# Patient Record
Sex: Female | Born: 1985 | Race: Black or African American | Hispanic: No | Marital: Married | Smoking: Current every day smoker
Health system: Southern US, Community
[De-identification: ages and names within clinical notes are randomized; demographics above are authoritative.]

## PROBLEM LIST (undated history)

## (undated) DIAGNOSIS — E559 Vitamin D deficiency, unspecified: Secondary | ICD-10-CM

## (undated) DIAGNOSIS — F1410 Cocaine abuse, uncomplicated: Secondary | ICD-10-CM

## (undated) DIAGNOSIS — N390 Urinary tract infection, site not specified: Secondary | ICD-10-CM

## (undated) DIAGNOSIS — I10 Essential (primary) hypertension: Secondary | ICD-10-CM

## (undated) DIAGNOSIS — J42 Unspecified chronic bronchitis: Secondary | ICD-10-CM

## (undated) HISTORY — PX: FRACTURE SURGERY: SHX138

## (undated) HISTORY — DX: Essential (primary) hypertension: I10

---
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
## 2006-06-22 ENCOUNTER — Emergency Department (HOSPITAL_COMMUNITY): Admission: EM | Admit: 2006-06-22 | Discharge: 2006-06-22 | Payer: Self-pay | Admitting: Emergency Medicine

## 2006-10-26 ENCOUNTER — Emergency Department (HOSPITAL_COMMUNITY): Admission: EM | Admit: 2006-10-26 | Discharge: 2006-10-26 | Payer: Self-pay | Admitting: Family Medicine

## 2006-11-29 ENCOUNTER — Emergency Department (HOSPITAL_COMMUNITY): Admission: EM | Admit: 2006-11-29 | Discharge: 2006-11-29 | Payer: Self-pay | Admitting: Emergency Medicine

## 2007-02-06 ENCOUNTER — Emergency Department (HOSPITAL_COMMUNITY): Admission: EM | Admit: 2007-02-06 | Discharge: 2007-02-06 | Payer: Self-pay | Admitting: Emergency Medicine

## 2007-04-29 ENCOUNTER — Emergency Department (HOSPITAL_COMMUNITY): Admission: EM | Admit: 2007-04-29 | Discharge: 2007-04-29 | Payer: Self-pay | Admitting: Emergency Medicine

## 2008-01-08 ENCOUNTER — Emergency Department (HOSPITAL_COMMUNITY): Admission: EM | Admit: 2008-01-08 | Discharge: 2008-01-08 | Payer: Self-pay | Admitting: Family Medicine

## 2009-01-22 ENCOUNTER — Emergency Department (HOSPITAL_COMMUNITY): Admission: EM | Admit: 2009-01-22 | Discharge: 2009-01-22 | Payer: Self-pay | Admitting: Family Medicine

## 2009-02-25 ENCOUNTER — Emergency Department (HOSPITAL_COMMUNITY): Admission: EM | Admit: 2009-02-25 | Discharge: 2009-02-25 | Payer: Self-pay | Admitting: Family Medicine

## 2009-06-12 ENCOUNTER — Emergency Department (HOSPITAL_COMMUNITY): Admission: EM | Admit: 2009-06-12 | Discharge: 2009-06-12 | Payer: Self-pay | Admitting: Family Medicine

## 2009-09-07 ENCOUNTER — Emergency Department (HOSPITAL_COMMUNITY): Admission: EM | Admit: 2009-09-07 | Discharge: 2009-09-07 | Payer: Self-pay | Admitting: Emergency Medicine

## 2010-03-19 ENCOUNTER — Emergency Department (HOSPITAL_COMMUNITY): Admission: EM | Admit: 2010-03-19 | Discharge: 2010-03-19 | Payer: Self-pay | Admitting: Emergency Medicine

## 2010-07-26 ENCOUNTER — Emergency Department (HOSPITAL_COMMUNITY)
Admission: EM | Admit: 2010-07-26 | Discharge: 2010-07-26 | Payer: Self-pay | Source: Home / Self Care | Admitting: Family Medicine

## 2010-07-31 LAB — POCT PREGNANCY, URINE: Preg Test, Ur: NEGATIVE

## 2010-10-18 LAB — POCT RAPID STREP A (OFFICE): Streptococcus, Group A Screen (Direct): POSITIVE — AB

## 2010-11-26 ENCOUNTER — Emergency Department (HOSPITAL_COMMUNITY)
Admission: EM | Admit: 2010-11-26 | Discharge: 2010-11-26 | Disposition: A | Discharge status: Home / Self Care | Payer: Self-pay | Attending: Emergency Medicine | Admitting: Emergency Medicine

## 2010-11-26 ENCOUNTER — Emergency Department (HOSPITAL_COMMUNITY): Payer: Self-pay

## 2010-11-26 DIAGNOSIS — R630 Anorexia: Secondary | ICD-10-CM | POA: Insufficient documentation

## 2010-11-26 DIAGNOSIS — A499 Bacterial infection, unspecified: Secondary | ICD-10-CM | POA: Insufficient documentation

## 2010-11-26 DIAGNOSIS — N12 Tubulo-interstitial nephritis, not specified as acute or chronic: Secondary | ICD-10-CM | POA: Insufficient documentation

## 2010-11-26 DIAGNOSIS — N760 Acute vaginitis: Secondary | ICD-10-CM | POA: Insufficient documentation

## 2010-11-26 DIAGNOSIS — R1031 Right lower quadrant pain: Secondary | ICD-10-CM | POA: Insufficient documentation

## 2010-11-26 DIAGNOSIS — B9689 Other specified bacterial agents as the cause of diseases classified elsewhere: Secondary | ICD-10-CM | POA: Insufficient documentation

## 2010-11-26 LAB — DIFFERENTIAL
Basophils Absolute: 0 10*3/uL (ref 0.0–0.1)
Basophils Relative: 0 % (ref 0–1)
Eosinophils Absolute: 0 10*3/uL (ref 0.0–0.7)
Eosinophils Relative: 0 % (ref 0–5)
Lymphocytes Relative: 6 % — ABNORMAL LOW (ref 12–46)
Lymphs Abs: 1 10*3/uL (ref 0.7–4.0)
Monocytes Absolute: 1.7 10*3/uL — ABNORMAL HIGH (ref 0.1–1.0)
Monocytes Relative: 11 % (ref 3–12)
Neutro Abs: 13.6 10*3/uL — ABNORMAL HIGH (ref 1.7–7.7)
Neutrophils Relative %: 83 % — ABNORMAL HIGH (ref 43–77)

## 2010-11-26 LAB — URINALYSIS, ROUTINE W REFLEX MICROSCOPIC
Bilirubin Urine: NEGATIVE
Glucose, UA: NEGATIVE mg/dL
Ketones, ur: 15 mg/dL — AB
Nitrite: POSITIVE — AB
Protein, ur: 30 mg/dL — AB
Specific Gravity, Urine: 1.01 (ref 1.005–1.030)
Urobilinogen, UA: 2 mg/dL — ABNORMAL HIGH (ref 0.0–1.0)
pH: 6 (ref 5.0–8.0)

## 2010-11-26 LAB — BASIC METABOLIC PANEL
BUN: 3 mg/dL — ABNORMAL LOW (ref 6–23)
CO2: 24 mEq/L (ref 19–32)
Calcium: 8.6 mg/dL (ref 8.4–10.5)
Chloride: 100 mEq/L (ref 96–112)
Creatinine, Ser: 0.65 mg/dL (ref 0.4–1.2)
GFR calc Af Amer: >60 mL/min (ref >60)
GFR calc non Af Amer: >60 mL/min (ref >60)
Glucose, Bld: 98 mg/dL (ref 70–99)
Potassium: 3.5 mEq/L (ref 3.5–5.1)
Sodium: 132 mEq/L — ABNORMAL LOW (ref 135–145)

## 2010-11-26 LAB — CBC
HCT: 41.4 % (ref 36.0–46.0)
Hemoglobin: 15 g/dL (ref 12.0–15.0)
MCH: 32.1 pg (ref 26.0–34.0)
MCHC: 36.2 g/dL — ABNORMAL HIGH (ref 30.0–36.0)
MCV: 88.5 fL (ref 78.0–100.0)
Platelets: 207 10*3/uL (ref 150–400)
RBC: 4.68 MIL/uL (ref 3.87–5.11)
RDW: 13.6 % (ref 11.5–15.5)
WBC: 16.3 10*3/uL — ABNORMAL HIGH (ref 4.0–10.5)

## 2010-11-26 LAB — URINE MICROSCOPIC-ADD ON

## 2010-11-26 LAB — POCT PREGNANCY, URINE: Preg Test, Ur: NEGATIVE

## 2010-11-26 LAB — WET PREP, GENITAL
Trich, Wet Prep: NONE SEEN
Yeast Wet Prep HPF POC: NONE SEEN

## 2010-11-26 LAB — RPR: RPR Ser Ql: NONREACTIVE

## 2010-11-26 MED ORDER — IOHEXOL 300 MG/ML  SOLN
100.0000 mL | Freq: Once | INTRAMUSCULAR | Status: AC | PRN
  Administered 2010-11-26: 100 mL via INTRAVENOUS

## 2010-11-27 LAB — GC/CHLAMYDIA PROBE AMP, GENITAL
Chlamydia, DNA Probe: NEGATIVE
GC Probe Amp, Genital: NEGATIVE

## 2010-11-29 LAB — URINE CULTURE
Colony Count: >=100000
Culture  Setup Time: 201205140001

## 2011-04-15 ENCOUNTER — Inpatient Hospital Stay (INDEPENDENT_AMBULATORY_CARE_PROVIDER_SITE_OTHER)
Admission: RE | Admit: 2011-04-15 | Discharge: 2011-04-15 | Disposition: A | Discharge status: Home / Self Care | Payer: Self-pay | Source: Ambulatory Visit | Attending: Emergency Medicine | Admitting: Emergency Medicine

## 2011-04-15 ENCOUNTER — Ambulatory Visit (INDEPENDENT_AMBULATORY_CARE_PROVIDER_SITE_OTHER): Payer: Self-pay

## 2011-04-15 DIAGNOSIS — J40 Bronchitis, not specified as acute or chronic: Secondary | ICD-10-CM

## 2011-04-15 DIAGNOSIS — R071 Chest pain on breathing: Secondary | ICD-10-CM

## 2011-04-26 LAB — POCT URINALYSIS DIP (DEVICE)
Glucose, UA: NEGATIVE
Ketones, ur: NEGATIVE
Nitrite: NEGATIVE
Operator id: 116391
Protein, ur: 30 — AB
Specific Gravity, Urine: 1.025
Urobilinogen, UA: 2 — ABNORMAL HIGH
pH: 6

## 2011-04-26 LAB — GC/CHLAMYDIA PROBE AMP, GENITAL
Chlamydia, DNA Probe: POSITIVE — AB
GC Probe Amp, Genital: NEGATIVE

## 2011-04-26 LAB — WET PREP, GENITAL
Trich, Wet Prep: NONE SEEN
Yeast Wet Prep HPF POC: NONE SEEN

## 2011-04-26 LAB — POCT PREGNANCY, URINE
Operator id: 116391
Preg Test, Ur: NEGATIVE

## 2011-04-30 LAB — POCT URINALYSIS DIP (DEVICE)
Bilirubin Urine: NEGATIVE
Nitrite: NEGATIVE
Specific Gravity, Urine: 1.015
Urobilinogen, UA: 0.2
pH: 6.5

## 2011-04-30 LAB — POCT PREGNANCY, URINE
Operator id: 239701
Preg Test, Ur: NEGATIVE

## 2011-08-21 ENCOUNTER — Observation Stay (HOSPITAL_COMMUNITY): Payer: Self-pay

## 2011-08-21 ENCOUNTER — Observation Stay (HOSPITAL_COMMUNITY)
Admission: EM | Admit: 2011-08-21 | Discharge: 2011-08-21 | Disposition: A | Discharge status: Home / Self Care | Payer: Self-pay | Attending: Emergency Medicine | Admitting: Emergency Medicine

## 2011-08-21 ENCOUNTER — Encounter (HOSPITAL_COMMUNITY): Payer: Self-pay | Admitting: *Deleted

## 2011-08-21 ENCOUNTER — Emergency Department (HOSPITAL_COMMUNITY): Payer: Self-pay

## 2011-08-21 DIAGNOSIS — N10 Acute tubulo-interstitial nephritis: Secondary | ICD-10-CM

## 2011-08-21 DIAGNOSIS — R1031 Right lower quadrant pain: Principal | ICD-10-CM | POA: Insufficient documentation

## 2011-08-21 DIAGNOSIS — R300 Dysuria: Secondary | ICD-10-CM | POA: Insufficient documentation

## 2011-08-21 DIAGNOSIS — R509 Fever, unspecified: Secondary | ICD-10-CM | POA: Insufficient documentation

## 2011-08-21 DIAGNOSIS — R630 Anorexia: Secondary | ICD-10-CM | POA: Insufficient documentation

## 2011-08-21 LAB — URINE MICROSCOPIC-ADD ON

## 2011-08-21 LAB — URINALYSIS, ROUTINE W REFLEX MICROSCOPIC
Bilirubin Urine: NEGATIVE
Glucose, UA: NEGATIVE mg/dL
Specific Gravity, Urine: 1.008 (ref 1.005–1.030)
Urobilinogen, UA: 4 mg/dL — ABNORMAL HIGH (ref 0.0–1.0)

## 2011-08-21 LAB — COMPREHENSIVE METABOLIC PANEL
ALT: 8 U/L (ref 0–35)
AST: 14 U/L (ref 0–37)
Albumin: 3.2 g/dL — ABNORMAL LOW (ref 3.5–5.2)
CO2: 22 mEq/L (ref 19–32)
Chloride: 95 mEq/L — ABNORMAL LOW (ref 96–112)
GFR calc non Af Amer: >90 mL/min (ref >90)
Potassium: 3.5 mEq/L (ref 3.5–5.1)
Sodium: 130 mEq/L — ABNORMAL LOW (ref 135–145)
Total Bilirubin: 0.4 mg/dL (ref 0.3–1.2)

## 2011-08-21 LAB — CBC
HCT: 41.8 % (ref 36.0–46.0)
MCHC: 36.6 g/dL — ABNORMAL HIGH (ref 30.0–36.0)
Platelets: 291 10*3/uL (ref 150–400)
RDW: 12.6 % (ref 11.5–15.5)
WBC: 19.4 10*3/uL — ABNORMAL HIGH (ref 4.0–10.5)

## 2011-08-21 LAB — DIFFERENTIAL
Basophils Absolute: 0 10*3/uL (ref 0.0–0.1)
Basophils Relative: 0 % (ref 0–1)
Lymphocytes Relative: 6 % — ABNORMAL LOW (ref 12–46)
Neutro Abs: 15.3 10*3/uL — ABNORMAL HIGH (ref 1.7–7.7)
Neutrophils Relative %: 79 % — ABNORMAL HIGH (ref 43–77)

## 2011-08-21 MED ORDER — CIPROFLOXACIN HCL 500 MG PO TABS: 500.0000 mg | ORAL_TABLET | Freq: Two times a day (BID) | ORAL | Status: AC

## 2011-08-21 MED ORDER — HYDROMORPHONE HCL PF 1 MG/ML IJ SOLN
1.0000 mg | Freq: Once | INTRAMUSCULAR | Status: AC
  Administered 2011-08-21: 1 mg via INTRAVENOUS
  Filled 2011-08-21: qty 1

## 2011-08-21 MED ORDER — SODIUM CHLORIDE 0.9 % IV BOLUS (SEPSIS)
1000.0000 mL | Freq: Once | INTRAVENOUS | Status: AC
  Administered 2011-08-21: 1000 mL via INTRAVENOUS

## 2011-08-21 MED ORDER — ONDANSETRON HCL 4 MG/2ML IJ SOLN
4.0000 mg | Freq: Once | INTRAMUSCULAR | Status: AC
  Administered 2011-08-21: 4 mg via INTRAVENOUS
  Filled 2011-08-21: qty 2

## 2011-08-21 MED ORDER — IBUPROFEN 200 MG PO TABS: 600.0000 mg | ORAL_TABLET | Freq: Four times a day (QID) | ORAL | Status: DC | PRN

## 2011-08-21 MED ORDER — ONDANSETRON HCL 4 MG/2ML IJ SOLN: 4.0000 mg | Freq: Four times a day (QID) | INTRAMUSCULAR | Status: DC | PRN

## 2011-08-21 MED ORDER — DEXTROSE 5 % IV SOLN: 1.0000 g | INTRAVENOUS | Status: DC

## 2011-08-21 MED ORDER — DEXTROSE 5 % IV SOLN
1.0000 g | Freq: Once | INTRAVENOUS | Status: AC
  Administered 2011-08-21: 1 g via INTRAVENOUS
  Filled 2011-08-21: qty 10

## 2011-08-21 MED ORDER — ACETAMINOPHEN 325 MG PO TABS
650.0000 mg | ORAL_TABLET | Freq: Once | ORAL | Status: DC
  Filled 2011-08-21: qty 2

## 2011-08-21 MED ORDER — ONDANSETRON HCL 4 MG PO TABS: 4.0000 mg | ORAL_TABLET | Freq: Four times a day (QID) | ORAL | Status: AC

## 2011-08-21 MED ORDER — HYDROCODONE-ACETAMINOPHEN 5-325 MG PO TABS: 2.0000 | ORAL_TABLET | ORAL | Status: AC | PRN

## 2011-08-21 MED ORDER — ACETAMINOPHEN 325 MG PO TABS
650.0000 mg | ORAL_TABLET | Freq: Four times a day (QID) | ORAL | Status: DC | PRN
  Administered 2011-08-21: 650 mg via ORAL

## 2011-08-21 MED ORDER — IOHEXOL 300 MG/ML  SOLN
80.0000 mL | Freq: Once | INTRAMUSCULAR | Status: AC | PRN
  Administered 2011-08-21: 80 mL via INTRAVENOUS

## 2011-08-21 NOTE — ED Provider Notes (Signed)
History     CSN: [id number]  Arrival date & time 08/21/11  1113   First MD Initiated Contact with Patient 08/21/11 1230      Chief Complaint  Patient presents with  . Abdominal Pain    right    (Consider location/radiation/quality/duration/timing/severity/associated sxs/prior treatment) HPI Comments: Patient with onset of dysuria and right lateral abdominal pain 7 days ago. Pain is gradually worsened since then. Patient reports having lack of appetite for the past 2 days and has not eaten. She has had a fever at home. She denies nausea, vomiting, or diarrhea. She denies vaginal bleeding or discharge. No history of abdominal surgeries. Patient has not taken any medications for pain.  Patient is a 26 y.o. female presenting with abdominal pain. The history is provided by the patient.  Abdominal Pain The primary symptoms of the illness include abdominal pain, fever and dysuria. The primary symptoms of the illness do not include shortness of breath, nausea, vomiting, diarrhea, vaginal discharge or vaginal bleeding. The current episode started more than 2 days ago. The onset of the illness was gradual. The problem has been gradually worsening.  Additional symptoms associated with the illness include anorexia. Symptoms associated with the illness do not include back pain.    Past Medical History  Diagnosis Date  . Urinary tract infection     History reviewed. No pertinent past surgical history.  No family history on file.  History  Substance Use Topics  . Smoking status: Current Everyday Smoker  . Smokeless tobacco: Not on file  . Alcohol Use: Yes     occ    OB History    Grav Para Term Preterm Abortions TAB SAB Ect Mult Living                  Review of Systems  Constitutional: Positive for fever.  HENT: Negative for sore throat and rhinorrhea.   Eyes: Negative for redness.  Respiratory: Negative for cough and shortness of breath.   Cardiovascular: Negative for chest  pain.  Gastrointestinal: Positive for abdominal pain and anorexia. Negative for nausea, vomiting, diarrhea and blood in stool.  Genitourinary: Positive for dysuria and flank pain. Negative for vaginal bleeding and vaginal discharge.  Musculoskeletal: Negative for myalgias and back pain.  Skin: Negative for rash.  Neurological: Negative for headaches.    Allergies  Review of patient's allergies indicates no known allergies.  Home Medications  No current outpatient prescriptions on file.  BP 110/65  Pulse 109  Temp(Src) 100.3 F (37.9 C) (Oral)  Resp 18  SpO2 98%  LMP 07/17/2011  Physical Exam  Nursing note and vitals reviewed. Constitutional: She is oriented to person, place, and time. She appears well-developed and well-nourished.       Uncomfortable appearing  HENT:  Head: Normocephalic and atraumatic.  Eyes: Conjunctivae are normal. Right eye exhibits no discharge. Left eye exhibits no discharge.  Neck: Normal range of motion. Neck supple.  Cardiovascular: Regular rhythm and normal heart sounds.  Tachycardia present.   Pulmonary/Chest: Effort normal and breath sounds normal.  Abdominal: Soft. There is tenderness in the right lower quadrant. There is no rigidity, no rebound, no guarding and no CVA tenderness.    Neurological: She is alert and oriented to person, place, and time.  Skin: Skin is warm and dry.  Psychiatric: She has a normal mood and affect.    ED Course  Procedures (including critical care time)  Labs Reviewed  URINALYSIS, ROUTINE W REFLEX MICROSCOPIC - Abnormal; Notable  for the following:    Color, Urine AMBER (*) BIOCHEMICALS MAY BE AFFECTED BY COLOR   APPearance TURBID (*)    Hgb urine dipstick LARGE (*)    Protein, ur 100 (*)    Urobilinogen, UA 4.0 (*)    Nitrite POSITIVE (*)    Leukocytes, UA LARGE (*)    All other components within normal limits  URINE MICROSCOPIC-ADD ON - Abnormal; Notable for the following:    Bacteria, UA FEW (*)    All  other components within normal limits  CBC - Abnormal; Notable for the following:    WBC 19.4 (*)    Hemoglobin 15.3 (*)    MCHC 36.6 (*)    All other components within normal limits  DIFFERENTIAL - Abnormal; Notable for the following:    Neutrophils Relative 79 (*)    Neutro Abs 15.3 (*)    Lymphocytes Relative 6 (*)    Monocytes Relative 15 (*)    Monocytes Absolute 2.9 (*)    All other components within normal limits  COMPREHENSIVE METABOLIC PANEL - Abnormal; Notable for the following:    Sodium 130 (*)    Chloride 95 (*)    Glucose, Bld 104 (*)    BUN 4 (*)    Albumin 3.2 (*)    All other components within normal limits  POCT PREGNANCY, URINE  URINE CULTURE   Dg Chest 2 View  08/21/2011  *RADIOLOGY REPORT*  Clinical Data: Cough and fever  CHEST - 2 VIEW  Comparison: 04/15/2011  Findings: Lungs are hyperaerated and clear with bronchitic changes. Normal heart size.  No mass or consolidation.  No pneumothorax or pleural effusion.  IMPRESSION: No active cardiopulmonary disease.  Hyperaeration and bronchitic changes likely reflect COPD.  Original Report Authenticated By: Donavan Burnet, M.D.   Ct Abdomen Pelvis W Contrast  08/21/2011  *RADIOLOGY REPORT*  Clinical Data: Right lower quadrant abdominal pain, history of UTI, evaluate for pyelonephritis versus appendicitis  CT ABDOMEN AND PELVIS WITH CONTRAST  Technique:  Multidetector CT imaging of the abdomen and pelvis was performed following the standard protocol during bolus administration of intravenous contrast.  Contrast: 80mL OMNIPAQUE IOHEXOL 300 MG/ML IV SOLN  Comparison: 11/26/2010  Findings: Lung bases are essentially clear.  Liver, spleen, pancreas, and adrenal glands are within normal limits.  Gallbladder is unremarkable.  No intrahepatic or extrahepatic ductal dilatation.  Multiple wedge-shaped areas of hypoperfusion involving the right kidney.  At least one of possible area of hypoperfusion involving the medial interpolar left  kidney (series 2/image 28).  Mild perinephric soft tissue stranding inferior to the right kidney (series 2/image 48).  No evidence of bowel obstruction.  Normal appendix.  No evidence of abdominal aortic aneurysm.  No suspicious abdominopelvic lymphadenopathy.  Small volume pelvic ascites, likely physiologic.  Uterus and bilateral ovaries are unremarkable.  Bladder is within normal limits.  Visualized osseous structures are within normal limits.  IMPRESSION: Findings compatible with pyelonephritis involving the right kidney and possibly the medial interpolar left kidney, as described above.  Normal appendix.  Original Report Authenticated By: Charline Bills, M.D.     1. Pyelonephritis, acute     1:14 PM Patient seen and examined. Work-up initiated. Medications ordered.   Vital signs reviewed and are as follows: Filed Vitals:   08/21/11 1134  BP: 110/65  Pulse: 109  Temp: 100.3 F (37.9 C)  Resp: 18   Tachycardia and temperature noted. Patient has obvious urinary tract infection on UA. Will give fluids, give pain  medicine, nausea medicine, check labs. Suspect pyelonephritis currently, low suspicion for appendicitis.  3:25 PM Patient was seen and examined by Dr. Radford Pax. CT ordered to r/o appendicitis. Moved to CDU. Hand-off to [employer], Dr. Fonnie Jarvis.    Plan: dispo per CT, can go home on cipro if tolerating PO's. Otherwise admit.     MDM  Pyelonephritis        Carolee Rota, [place] 08/21/11 2228

## 2011-08-21 NOTE — ED Notes (Signed)
Pt was medicated for pain as ordered. Will monitor, VSS

## 2011-08-21 NOTE — ED Provider Notes (Signed)
Patient report received from [facility] PA-C. [phone] female, in CDU under pyelonephritis protocol. Plan to continue to hydrate, pain medication, recheck labs, and continue antibiotic. Abdominal CT order to rule out appendicitis. Plan to discharge based on CT result. Patient will be discharged with Cipro if she can tolerate by mouth.  5:26 PM Right lower quadrant abdominal pain with a urine shows evidence of urinary tract infection. [place] lab significant for a sodium of 130, chloride of 95, CO2 19.4, neutrophils 79%. Urine shows evidence of urinary tract infection chest x-ray is unremarkable. Patient is currently awaiting for an abdominal CT scan to bile appendicitis. She states her pain has returned and request for pain medication. Patient will be given pain medication, IV fluid, IV antibiotics. Elevated temp, acetaminophen given.    6:25 PM Abdominal CT scan shows evidence of pyelonephritis but no evidence of appendicitis. Patient is currently in no acute distress. She is able to tolerate by mouth. She has received 1 g of Rocephin along with IV fluid. Patient will be discharged with antibiotic and antinausea medication. Strict followup instruction given. Patient was understanding and agreed with plan.  Fayrene Helper, PA-C 08/21/11 [phone]

## 2011-08-21 NOTE — ED Notes (Signed)
Pt drinking second cup of contrast without difficulty.

## 2011-08-21 NOTE — ED Provider Notes (Signed)
Medical screening examination/treatment/procedure(s) were performed by non-physician practitioner and as supervising physician I was immediately available for consultation/collaboration.   Loren Racer, MD 08/21/11 2352

## 2011-08-21 NOTE — ED Notes (Signed)
Pt is here with right lateral abd pain and took penicillin for it.  PT is doubled over. Pt is having burning with urination.

## 2011-08-21 NOTE — ED Notes (Signed)
Fayrene Helper [facility] was made aware of patient's temperature of 100.6. Pt was medicated as ordered with Tylenol. Will monitor

## 2011-08-21 NOTE — ED Notes (Signed)
Pt finished with contrast.

## 2011-08-22 NOTE — ED Provider Notes (Signed)
Medical screening examination/treatment/procedure(s) were performed by non-physician practitioner and as supervising physician I was immediately available for consultation/collaboration.    Nelia Shi, MD 08/22/11 2042

## 2011-08-23 LAB — URINE CULTURE

## 2011-08-24 NOTE — ED Notes (Signed)
+   urine Patient treated with cipro-sensitive to same-chart appended per protocol MD. 

## 2014-03-20 ENCOUNTER — Emergency Department (HOSPITAL_COMMUNITY)
Admission: EM | Admit: 2014-03-20 | Discharge: 2014-03-20 | Disposition: A | Discharge status: Home / Self Care | Payer: Self-pay | Attending: Emergency Medicine | Admitting: Emergency Medicine

## 2014-03-20 ENCOUNTER — Encounter (HOSPITAL_COMMUNITY): Payer: Self-pay | Admitting: Emergency Medicine

## 2014-03-20 DIAGNOSIS — F17200 Nicotine dependence, unspecified, uncomplicated: Secondary | ICD-10-CM | POA: Insufficient documentation

## 2014-03-20 DIAGNOSIS — K089 Disorder of teeth and supporting structures, unspecified: Secondary | ICD-10-CM | POA: Insufficient documentation

## 2014-03-20 DIAGNOSIS — Z87440 Personal history of urinary (tract) infections: Secondary | ICD-10-CM | POA: Insufficient documentation

## 2014-03-20 DIAGNOSIS — K047 Periapical abscess without sinus: Secondary | ICD-10-CM | POA: Insufficient documentation

## 2014-03-20 MED ORDER — PENICILLIN V POTASSIUM 500 MG PO TABS: 500.0000 mg | ORAL_TABLET | Freq: Three times a day (TID) | ORAL | Status: DC

## 2014-03-20 MED ORDER — HYDROCODONE-ACETAMINOPHEN 5-325 MG PO TABS: 1.0000 | ORAL_TABLET | ORAL | Status: DC | PRN

## 2014-03-20 NOTE — Discharge Instructions (Signed)
Dental Abscess °A dental abscess is a collection of infected fluid (pus) from a bacterial infection in the inner part of the tooth (pulp). It usually occurs at the end of the tooth's root.  °CAUSES  °· Severe tooth decay. °· Trauma to the tooth that allows bacteria to enter into the pulp, such as a broken or chipped tooth. °SYMPTOMS  °· Severe pain in and around the infected tooth. °· Swelling and redness around the abscessed tooth or in the mouth or face. °· Tenderness. °· Pus drainage. °· Bad breath. °· Bitter taste in the mouth. °· Difficulty swallowing. °· Difficulty opening the mouth. °· Nausea. °· Vomiting. °· Chills. °· Swollen neck glands. °DIAGNOSIS  °· A medical and dental history will be taken. °· An examination will be performed by tapping on the abscessed tooth. °· X-rays may be taken of the tooth to identify the abscess. °TREATMENT °The goal of treatment is to eliminate the infection. You may be prescribed antibiotic medicine to stop the infection from spreading. A root canal may be performed to save the tooth. If the tooth cannot be saved, it may be pulled (extracted) and the abscess may be drained.  °HOME CARE INSTRUCTIONS °· Only take over-the-counter or prescription medicines for pain, fever, or discomfort as directed by your caregiver. °· Rinse your mouth (gargle) often with salt water (¼ tsp salt in 8 oz [250 ml] of warm water) to relieve pain or swelling. °· Do not drive after taking pain medicine (narcotics). °· Do not apply heat to the outside of your face. °· Return to your dentist for further treatment as directed. °SEEK MEDICAL CARE IF: °· Your pain is not helped by medicine. °· Your pain is getting worse instead of better. °SEEK IMMEDIATE MEDICAL CARE IF: °· You have a fever or persistent symptoms for more than 2-3 days. °· You have a fever and your symptoms suddenly get worse. °· You have chills or a very bad headache. °· You have problems breathing or swallowing. °· You have trouble  opening your mouth. °· You have swelling in the neck or around the eye. °Document Released: 07/02/2005 Document Revised: 03/26/2012 Document Reviewed: 10/10/2010 °ExitCare® Patient Information ©2015 [employer]. This information is not intended to replace advice given to you by your health care provider. Make sure you discuss any questions you have with your health care provider. ° °Emergency Department Resource Guide °1) Find a Doctor and Pay Out of Pocket °Although you won't have to find out who is covered by your insurance plan, it is a good idea to ask around and get recommendations. You will then need to call the office and see if the doctor you have chosen will accept you as a new patient and what types of options they offer for patients who are self-pay. Some doctors offer discounts or will set up payment plans for their patients who do not have insurance, but you will need to ask so you aren't surprised when you get to your appointment. ° °2) Contact Your Local Health Department °Not all health departments have doctors that can see patients for sick visits, but many do, so it is worth a call to see if yours does. If you don't know where your local health department is, you can check in your phone book. The CDC also has a tool to help you locate your state's health department, and many state websites also have listings of all of their local health departments. ° °3) Find a Walk-in Clinic °  If your illness is not likely to be very severe or complicated, you may want to try a walk in clinic. These are popping up all over the country in pharmacies, drugstores, and shopping centers. They're usually staffed by nurse practitioners or physician assistants that have been trained to treat common illnesses and complaints. They're usually fairly quick and inexpensive. However, if you have serious medical issues or chronic medical problems, these are probably not your best option.  No Primary Care Doctor: - Call  Health Connect at  [phone] - they can help you locate a primary care doctor that  accepts your insurance, provides certain services, etc. - Physician Referral [phone]    Dental Care: Organization         Address  Phone  Notes  [facility] Department of [facility] [place] [phone] Accepts children up to age 57 who are enrolled in [place] or [place]; pregnant women with a Medicaid card; and children who have applied for Medicaid or [place] Health Choice, but were declined, whose parents can pay a reduced fee at time of service.  [facility] Department of [facility] [place]  [address], [place] [phone] Accepts children up to age 87 who are enrolled in [place] or [place]; pregnant women with a Medicaid card; and children who have applied for Medicaid or [place] Health Choice, but were declined, whose parents can pay a reduced fee at time of service.  [facility]  [place] [phone] Patients are seen by appointment only. Walk-ins are not accepted. [place] will see patients 18 years of age and older. Monday - Tuesday (8am-5pm) Most Wednesdays (8:30-5pm) $30 per visit, cash only  [facility]  [address], [facility] [phone] Patients are seen by appointment only. Walk-ins are not accepted. [place] will see patients 28 years of age and older. One Wednesday Evening (Monthly: Volunteer Based).  $30 per visit, cash only  [place]  [phone] for adults; Children under age 53, call Graduate Pediatric Dentistry at [phone]. Children aged 81-14, please call [phone] to request a pediatric application.  Dental services are provided in all areas of dental care including fillings, crowns and bridges, complete and partial dentures, implants, gum treatment,  root canals, and extractions. Preventive care is also provided. Treatment is provided to both adults and children. Patients are selected via a lottery and there is often a waiting list.   [facility] [address], [place]  [phone] [email or web address]   [facility] [address] [place], [place] [phone], Ext. [phone] Second and Fourth Thursday of each month, opens at 6:30 AM; Clinic ends at 9 AM.  Patients are seen on a first-come first-served basis, and a limited number are seen during each clinic.   [facility]  [address] [place], [place] [phone]   Eligibility Requirements You must have lived in [place], [place], or [place] counties for at least the last three months.   You cannot be eligible for state or federal sponsored [employer], including [employer], [place], or [employer].   You generally cannot be eligible for healthcare insurance through your employer.    How to apply: Eligibility screenings are held every Tuesday and Wednesday afternoon from 1:00 pm until 4:00 pm. You do not need  an appointment for the interview!  [facility] [address] [place], [place]   [place]  [place]  [place]  [phone]

## 2014-03-20 NOTE — ED Notes (Signed)
Pt states shes had a toothache since Monday. She took naproxen this am with some relief but is concerned because the pain has persisted all week

## 2014-03-20 NOTE — ED Provider Notes (Signed)
CSN: [id number]     Arrival date & time 03/20/14  1322 History  This chart was scribed for Charlann Lange, PA, working with Charlesetta Shanks, MD found by Starleen Arms, ED Scribe. This patient was seen in room TR06C/TR06C and the patient's care was started at 1:41 PM.    No chief complaint on file.  Patient is a 28 y.o. female presenting with tooth pain. The history is provided by the patient. No language interpreter was used.  Dental Pain Severity:  Moderate Onset quality:  Gradual Duration:  6 days Timing:  Constant Progression:  Worsening Chronicity:  New Relieved by:  Nothing Worsened by:  Nothing tried Ineffective treatments:  NSAIDs Associated symptoms: no difficulty swallowing and no fever      HPI Comments: Ellen Stone is a 28 y.o. female who presents to the Emergency Department complaining of a gradually worsening area of dental swelling onset 6 days ago.  Patient reports ibuprofen initially relieved the pain, but upon worsening, taking increased doses of naproxen and ibuprofen did not afford relief.  Patient denies fever, facial swelling, difficulty swallowing. NKA  Past Medical History  Diagnosis Date  . Urinary tract infection    No past surgical history on file. No family history on file. History  Substance Use Topics  . Smoking status: Current Every Day Smoker  . Smokeless tobacco: Not on file  . Alcohol Use: Yes     Comment: occ   OB History   Grav Para Term Preterm Abortions TAB SAB Ect Mult Living                 Review of Systems  Constitutional: Negative for fever.  HENT: Positive for dental problem. Negative for trouble swallowing.   Gastrointestinal: Negative for vomiting.  All other systems reviewed and are negative.     Allergies  Review of patient's allergies indicates no known allergies.  Home Medications   Prior to Admission medications   Not on File   There were no vitals taken for this visit. Physical Exam  Nursing note and vitals  reviewed. Constitutional: She is oriented to person, place, and time. She appears well-developed and well-nourished. No distress.  HENT:  Head: Normocephalic and atraumatic.  Generally good dentition with abscess at number 4.  No facial swelling.  Anterior cervical adenopathy.  Oropharynx benign.   Eyes: Conjunctivae and EOM are normal.  Neck: Neck supple. No tracheal deviation present.  Cardiovascular: Normal rate.   Pulmonary/Chest: Effort normal. No respiratory distress.  Musculoskeletal: Normal range of motion.  Neurological: She is alert and oriented to person, place, and time.  Skin: Skin is warm and dry.  Psychiatric: She has a normal mood and affect. Her behavior is normal.    ED Course  Procedures (including critical care time)  DIAGNOSTIC STUDIES: Oxygen Saturation is 99% on RA, normal by my interpretation.    COORDINATION OF CARE:  1:43 PM Will prescribe penicillin.  Advised patient of return precautions, as well as need to follow-up with her dentist.  Patient acknowledges and agrees with plan.    Labs Review Labs Reviewed - No data to display  Imaging Review No results found.   EKG Interpretation None      MDM   Final diagnoses:  None    1. Dental abscess  Uncomplicated dental abscess, treated here with abx and pain management   I personally performed the services described in this documentation, which was scribed in my presence. The recorded information has been reviewed  and is accurate.     Dewaine Oats, PA-C 03/21/14 [phone]

## 2014-03-21 NOTE — ED Provider Notes (Signed)
Medical screening examination/treatment/procedure(s) were performed by non-physician practitioner and as supervising physician I was immediately available for consultation/collaboration.   EKG Interpretation None       Babette Relic, MD 03/21/14 1232

## 2015-05-27 ENCOUNTER — Encounter (HOSPITAL_COMMUNITY): Payer: Self-pay | Admitting: Nurse Practitioner

## 2015-05-27 ENCOUNTER — Emergency Department (HOSPITAL_COMMUNITY)
Admission: EM | Admit: 2015-05-27 | Discharge: 2015-05-27 | Disposition: A | Discharge status: Home / Self Care | Payer: Self-pay | Attending: Emergency Medicine | Admitting: Emergency Medicine

## 2015-05-27 DIAGNOSIS — Z87440 Personal history of urinary (tract) infections: Secondary | ICD-10-CM | POA: Insufficient documentation

## 2015-05-27 DIAGNOSIS — Z720 Tobacco use: Secondary | ICD-10-CM | POA: Insufficient documentation

## 2015-05-27 DIAGNOSIS — K0889 Other specified disorders of teeth and supporting structures: Secondary | ICD-10-CM | POA: Insufficient documentation

## 2015-05-27 DIAGNOSIS — K0381 Cracked tooth: Secondary | ICD-10-CM | POA: Insufficient documentation

## 2015-05-27 DIAGNOSIS — Z792 Long term (current) use of antibiotics: Secondary | ICD-10-CM | POA: Insufficient documentation

## 2015-05-27 MED ORDER — NAPROXEN 500 MG PO TABS: 500.0000 mg | ORAL_TABLET | Freq: Two times a day (BID) | ORAL | Status: DC

## 2015-05-27 NOTE — Discharge Instructions (Signed)
Take the prescribed medication as directed. Follow-up with dentist, call on Monday to make appointment. Return to the ED for new or worsening symptoms.   Emergency Department Resource Guide 1) Find a Doctor and Pay Out of Pocket Although you won't have to find out who is covered by your insurance plan, it is a good idea to ask around and get recommendations. You will then need to call the office and see if the doctor you have chosen will accept you as a new patient and what types of options they offer for patients who are self-pay. Some doctors offer discounts or will set up payment plans for their patients who do not have insurance, but you will need to ask so you aren't surprised when you get to your appointment.  2) Contact Your Local Health Department Not all health departments have doctors that can see patients for sick visits, but many do, so it is worth a call to see if yours does. If you don't know where your local health department is, you can check in your phone book. The CDC also has a tool to help you locate your state's health department, and many state websites also have listings of all of their local health departments.  3) Find a [facility] If your illness is not likely to be very severe or complicated, you may want to try a walk in clinic. These are popping up all over the country in pharmacies, drugstores, and shopping centers. They're usually staffed by nurse practitioners or physician assistants that have been trained to treat common illnesses and complaints. They're usually fairly quick and inexpensive. However, if you have serious medical issues or chronic medical problems, these are probably not your best option.  No Primary Care Doctor: - Call Health Connect at  [phone] - they can help you locate a primary care doctor that  accepts your insurance, provides certain services, etc. - Physician Referral Service- [phone]  Chronic Pain Problems: Organization          Address  Phone   Notes  [facility]  [phone] Patients need to be referred by their primary care doctor.   Medication Assistance: Organization         Address  Phone   Notes  [facility] [place]., [place], [place] [address] [phone] --Must be a resident of [facility] -- Must have NO insurance coverage whatsoever (no Medicaid/ Medicare, etc.) -- The pt. MUST have a primary care doctor that directs their care regularly and follows them in the community   MedAssist  [phone]   [employer]  [phone]    Agencies that provide inexpensive medical care: Organization         Address  Phone   Notes  [place]  [phone]   [facility]    [phone]   [facility] [place], [place] [address] [phone]   [place] [address] [place] [phone]   Planned Parenthood    [phone]   [facility]    [phone]   [place] and [place] [address], [place] Phone:  [phone], Fax:  [phone] Hours of Operation:  9 am - 6 pm, M-F.  Also accepts Medicaid/Medicare and self-pay.  [facility] for [place] [address], [employer] Phone: (  [phone], Fax: [phone]. Hours of Operation:  8:30 am - 5:30 pm, M-F.  Also accepts Medicaid and self-pay.  [facility] [place] [address], [place] Phone: [phone]   [place], [place], [place] [phone], Ext. [phone] Mondays & Thursdays: 7-9 AM.  First 15 patients are seen on a first come, first serve basis.    [place] Providers:  Organization         Address  Phone   Notes  [facility] [address], [place] [phone] Also accepts self-pay patients.  [facility] [address], [place]  [phone]   [place], [address], [place] [phone]   [facility] [address] [place] [phone]   Lucianne Lei [address], [place]   [phone] Only accepts [place] Access [place] patients after they have their name applied to their card.   Self-Pay (no insurance) in [facility]:  Organization         Address  Phone   Notes  Sickle Cell Patients, [facility] [place] [phone]   [facility] [place] [phone]   [facility] [place]  [place], [place], [place] [phone]   [facility]/Dr. Osei-Bonsu  [address] [place] or [address], [place] [phone] Phone number for both [place] and [place] locations is the same.  Urgent Medical and [facility] [address], [place] [phone]   [facility] [address] [place] or [address] [phone]   [facility] [address] [place] [phone], phone; [phone], fax Sees patients 1st and 3rd Saturday of every month.  Must not qualify for public or private insurance (i.e. Medicaid, Medicare, [place] Health Choice, Veterans' Benefits)  Household income should be no more than 200% of the poverty level The clinic cannot treat you if you are pregnant or think you are pregnant  Sexually transmitted diseases are not treated at the clinic.    Dental Care: Organization         Address  Phone  Notes  [facility] Department of [facility] [place] [phone] Accepts children up to age 28 who are enrolled in [place] or [place]; pregnant women with a Medicaid card; and  children who have applied for Medicaid or [place] Health Choice, but were declined, whose parents can pay a reduced fee at time of service.  [facility] Department of [facility]  [address], [place] [phone] Accepts children up to age 15 who are enrolled in [place] or [place]; pregnant women with a Medicaid card; and children who have applied for Medicaid or [place] Health Choice, but were declined, whose parents can pay a reduced fee at time of service.  [place] [facility]  [place] [phone] Patients are seen by appointment only. Walk-ins are not accepted. [place] will see patients 11 years of age and older. Monday - Tuesday (8am-5pm) Most Wednesdays (8:30-5pm) $30 per visit, cash only  [facility]  [address],  [place] [phone] Patients are seen by appointment only. Walk-ins are not accepted. [place] will see patients 54 years of age and older. One Wednesday Evening (Monthly: Volunteer Based).  $30 per visit, cash only  [place]  [phone] for adults; Children under age 55, call Graduate Pediatric Dentistry at [phone]. Children aged 23-14, please call [phone] to request a pediatric application.  Dental services are provided in all areas of dental care including fillings, crowns and bridges, complete and partial dentures, implants, gum treatment, root canals, and extractions. Preventive care is also provided. Treatment is provided to both adults and children. Patients are selected via a lottery and there is often a waiting list.   [facility] [address] [place]  [phone] [email or web address]   [facility] [address] [place], [place] [phone], Ext. [phone] Second and Fourth Thursday of each month, opens at 6:30 AM; Clinic ends at 9 AM.  Patients are seen on a first-come first-served  basis, and a limited number are seen during each clinic.   [facility]  [address] [place], [place] [phone]   Eligibility Requirements You must have lived in [place], [place], or [place] counties for at least the last three months.   You cannot be eligible for state or federal sponsored [employer], including [employer], [place], or [employer].   You generally cannot be eligible for healthcare insurance through your employer.    How to apply: Eligibility screenings are held every Tuesday and Wednesday afternoon from 1:00 pm until 4:00 pm. You do not need an appointment for the interview!  [facility] [address] [place], [place]   [place]  [place] Department  [place]  [phone]    Behavioral Health Resources in the Community: Intensive Outpatient Programs Organization         Address  Phone  Notes  [place] Hunter. [address] [place], [place] [phone]   [facility] Outpatient [address], [place], [place]   ADS: [facility] [address], [place], [place]   [place] [address],  [place], [place] or [phone]   Substance Abuse Resources Organization         Address  Phone  Notes  Al[facility] Drug Services  [phone]   [place]  [phone]   [facility] [place]   Chinita Pester  [phone]   Residential & Outpatient Substance Abuse Program  [phone]   Psychological Services Organization         Address  Phone  Notes  [facility] [place]  [place]  [phone]   [place] [address], [place] [employer] or [phone]    Mobile Crisis Teams Organization          Address  Phone  Notes  [facility], Mobile Crisis Care Unit  [phone]   [facility]  [address] [place], [place]   [place] [address], [place] [phone]    Self-Help/Support Groups Organization         Address  Phone             Notes  [place]. of [place] - variety of support  groups  [phone]- [phone] Call for more information  Narcotics Anonymous (NA), [facility] [address], [employer] [place]  2 meetings at this location   Residential Facilities manager         Address  Phone  Notes  [facility] [place],    [place]  [phone]   [facility]  [address], [place] [phone], [place], [place]   [place], [place] [phone] Admissions: 8am-3pm M-F  Incentives Substance [place] [address]    [place], [place] [phone]   [facility] [address] [place], [place], [place]   [facility] [address]  [place], [place]   [facility] - Intensive Outpatient [address] Kristeen [address], [place], [place]   [facility] [address].,  [place], [place] [phone] or [phone]   [facility] ([facility]S) [address] [place], [place] Accepts Medicaid  [facility] [place] [address]  [place] [phone] Substance Abuse/Addiction Treatment   [facility] The Organization         Address  Phone  Notes  [facility]  [phone]   Domenic Schwab, PhD [address] Arlis [place], [place]   [phone] or [phone]   [place], [place] [phone]   [facility] [address] [place], [place] [phone] Insurance/Medicaid/sponsorship  through [facility] and Families [address]                                    [place], [place] [phone] [place] [address], [place] [phone]    Dr. Adele Schilder  [phone]   [facility] Dept. 1) [address] [place] 2) [place] 3)  [place] [address], [place] [phone]  [phone]   [place] [phone] or [phone] (After Hours)

## 2015-05-27 NOTE — ED Notes (Signed)
She c/o L lower toothache increasingly worse and constant over past 2 weeks. She does not have insurance to see a Pharmacist, community

## 2015-05-27 NOTE — ED Notes (Signed)
Patient states she had a filling in a tooth that fell out over 6 months ago.   Patient states that she has had pain since.

## 2015-05-27 NOTE — ED Provider Notes (Signed)
CSN: [id number]     Arrival date & time 05/27/15  1206 History  By signing my name below, I, Evelene Croon, attest that this documentation has been prepared under the direction and in the presence of non-physician practitioner, [place] Carnes, PA-C. Electronically Signed: Evelene Croon, Scribe. 05/27/2015. 1:27 PM.     Chief Complaint  Patient presents with  . Dental Pain    The history is provided by the patient. No language interpreter was used.     HPI Comments:  Ellen Stone is a 29 y.o. female who presents to the Emergency Department complaining of moderate constant left lower dental pain for ~ 2 weeks. She states the filling in that tooth came out ~ 2 months ago. She denies fever. No alleviating factors or associated symptoms noted. Pt does not currently have a dentist.  No intervention tried PTA.  VSS.  Past Medical History  Diagnosis Date  . Urinary tract infection    History reviewed. No pertinent past surgical history. History reviewed. No pertinent family history. Social History  Substance Use Topics  . Smoking status: Current Every Day Smoker  . Smokeless tobacco: None  . Alcohol Use: Yes     Comment: occ   OB History    No data available     Review of Systems  Constitutional: Negative for fever and chills.  HENT: Positive for dental problem.   Respiratory: Negative for shortness of breath.   Cardiovascular: Negative for chest pain.  All other systems reviewed and are negative.     Allergies  Review of patient's allergies indicates no known allergies.  Home Medications   Prior to Admission medications   Medication Sig Start Date End Date Taking? Authorizing Provider  HYDROcodone-acetaminophen (NORCO/VICODIN) 5-325 MG per tablet Take 1-2 tablets by mouth every 4 (four) hours as needed. 03/20/14   Charlann Lange, PA-C  naproxen (NAPROSYN) 500 MG tablet Take 1 tablet (500 mg total) by mouth 2 (two) times daily with a meal. 05/27/15   Larene Pickett, PA-C   penicillin v potassium (VEETID) 500 MG tablet Take 1 tablet (500 mg total) by mouth 3 (three) times daily. 03/20/14   Shari Upstill, PA-C   BP 97/62 mmHg  Pulse 82  Temp(Src) 98.3 F (36.8 C) (Oral)  Resp 17  Ht 5\' 11"  (1.803 m)  Wt 152 lb 14.4 oz (69.355 kg)  BMI 21.33 kg/m2  SpO2 100%  LMP 05/06/2015   Physical Exam  Constitutional: She is oriented to person, place, and time. She appears well-developed and well-nourished.  HENT:  Head: Normocephalic and atraumatic.  Mouth/Throat: Oropharynx is clear and moist.  Teeth largely in poor dentition, left lower molar broken with defect along posterior aspect where filling is missing, surrounding gingiva normal in appearance without swelling or discoloration, no dental abscess noted, handling secretions appropriately, no trismus; no facial or neck swelling   Eyes: Conjunctivae and EOM are normal. Pupils are equal, round, and reactive to light.  Neck: Normal range of motion.  Cardiovascular: Normal rate, regular rhythm and normal heart sounds.   Pulmonary/Chest: Effort normal and breath sounds normal. No respiratory distress. She has no wheezes.  Musculoskeletal: Normal range of motion.  Neurological: She is alert and oriented to person, place, and time.  Skin: Skin is warm and dry.  Psychiatric: She has a normal mood and affect.  Nursing note and vitals reviewed.   ED Course  Procedures   DIAGNOSTIC STUDIES:  Oxygen Saturation is 100% on RA, normal by my interpretation.  COORDINATION OF CARE:  1:24 PM Discussed treatment plan with pt at bedside and pt agreed to plan.    MDM   Final diagnoses:  Pain, dental   Dental pain without signs of dental abscess.  No facial or neck swelling to suggest Ludwig's angina. Patient will be referred back to dentist for further management. Rx Naprosyn.  Discussed plan with patient, he/she acknowledged understanding and agreed with plan of care.  Return precautions given for new or worsening  symptoms.  I personally performed the services described in this documentation, which was scribed in my presence. The recorded information has been reviewed and is accurate.   Larene Pickett, PA-C 05/27/15 [place], MD 05/27/15 [phone]

## 2015-06-02 ENCOUNTER — Encounter (HOSPITAL_COMMUNITY): Payer: Self-pay

## 2015-06-02 ENCOUNTER — Emergency Department (HOSPITAL_COMMUNITY)
Admission: EM | Admit: 2015-06-02 | Discharge: 2015-06-02 | Disposition: A | Discharge status: Home / Self Care | Payer: Self-pay | Attending: Emergency Medicine | Admitting: Emergency Medicine

## 2015-06-02 DIAGNOSIS — F1420 Cocaine dependence, uncomplicated: Secondary | ICD-10-CM | POA: Insufficient documentation

## 2015-06-02 DIAGNOSIS — Z87440 Personal history of urinary (tract) infections: Secondary | ICD-10-CM | POA: Insufficient documentation

## 2015-06-02 DIAGNOSIS — F1210 Cannabis abuse, uncomplicated: Secondary | ICD-10-CM | POA: Insufficient documentation

## 2015-06-02 DIAGNOSIS — Z792 Long term (current) use of antibiotics: Secondary | ICD-10-CM | POA: Insufficient documentation

## 2015-06-02 DIAGNOSIS — F17200 Nicotine dependence, unspecified, uncomplicated: Secondary | ICD-10-CM | POA: Insufficient documentation

## 2015-06-02 DIAGNOSIS — Z791 Long term (current) use of non-steroidal anti-inflammatories (NSAID): Secondary | ICD-10-CM | POA: Insufficient documentation

## 2015-06-02 HISTORY — DX: Cocaine abuse, uncomplicated: F14.10

## 2015-06-02 LAB — CBC
HEMATOCRIT: 40.7 % (ref 36.0–46.0)
Hemoglobin: 13.9 g/dL (ref 12.0–15.0)
MCH: 30.5 pg (ref 26.0–34.0)
MCHC: 34.2 g/dL (ref 30.0–36.0)
MCV: 89.5 fL (ref 78.0–100.0)
Platelets: 345 10*3/uL (ref 150–400)
RBC: 4.55 MIL/uL (ref 3.87–5.11)
RDW: 14 % (ref 11.5–15.5)
WBC: 13.8 10*3/uL — ABNORMAL HIGH (ref 4.0–10.5)

## 2015-06-02 LAB — COMPREHENSIVE METABOLIC PANEL
ALBUMIN: 3.1 g/dL — AB (ref 3.5–5.0)
ALT: 12 U/L — ABNORMAL LOW (ref 14–54)
ANION GAP: 5 (ref 5–15)
AST: 17 U/L (ref 15–41)
Alkaline Phosphatase: 34 U/L — ABNORMAL LOW (ref 38–126)
BUN: 6 mg/dL (ref 6–20)
CHLORIDE: 110 mmol/L (ref 101–111)
CO2: 23 mmol/L (ref 22–32)
CREATININE: 0.81 mg/dL (ref 0.44–1.00)
Calcium: 8.3 mg/dL — ABNORMAL LOW (ref 8.9–10.3)
GFR calc Af Amer: >60 mL/min (ref >60)
GFR calc non Af Amer: >60 mL/min (ref >60)
GLUCOSE: 103 mg/dL — AB (ref 65–99)
Potassium: 4.4 mmol/L (ref 3.5–5.1)
SODIUM: 138 mmol/L (ref 135–145)
Total Bilirubin: 0.2 mg/dL — ABNORMAL LOW (ref 0.3–1.2)
Total Protein: 5.1 g/dL — ABNORMAL LOW (ref 6.5–8.1)

## 2015-06-02 LAB — ETHANOL

## 2015-06-02 LAB — RAPID URINE DRUG SCREEN, HOSP PERFORMED
Amphetamines: NOT DETECTED
BARBITURATES: NOT DETECTED
Benzodiazepines: NOT DETECTED
COCAINE: POSITIVE — AB
Opiates: NOT DETECTED
TETRAHYDROCANNABINOL: POSITIVE — AB

## 2015-06-02 NOTE — ED Notes (Signed)
Pt stable, ambulatory, states understanding of discharge instructions 

## 2015-06-02 NOTE — ED Notes (Signed)
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 Last used Tuesday "about a gram". Pt admits to also using marijuana. Denies ETOH or any other illlegal drugs. Denies SI/HI, N/V/D/abdominal pain.

## 2015-06-02 NOTE — Discharge Instructions (Signed)
Substance Abuse Treatment Programs ° °Intensive Outpatient Programs °[facility]     °[address]      °[place], [place]                   °[phone]      ° °[facility] °[address] °[place], [place] °[phone] ° °[facility]     °(Inpatient and outpatient)     °[address]           °[phone]   ° °[facility] °336-288-1484 (Suboxone and Methadone) ° °[address]      °[place], [place] [address]      °336-882-2125      ° °[address] °[place], [place] °[phone] ° °[facility] (Outpatient/Inpatient, Chemical)    °(insurance only) [phone]      °       °[facility] (Groups & Residential) °[place], [place] °336-389-1413 ° °   °[facility]     °[address]     °[place], [place]      °[phone]      ° °[facility] (for caregivers and family) °[address] °[place], [place] °336-299-4655 ° ° ° ° ° °Residential Treatment Programs °[facility]      °[address], [place], [place] [address]  °([phone]) [phone]      ° °[facility] °[address] [place], [place] [address] °[phone] ° °[facility]        °336-248-8914      ° °[facility] °[phone] ° °[facility]             °[address]                                         °[place], [place]                                                °[phone] or [phone]                              ° °[facility] [place] °[address] °[place], [address] °[phone] ° °[facility]    °[address]      °[place], [place]     °[phone]      ° °[facility] °[address] °[place], [place] °336-285-9073 ° °[facility]   °[address]     °[place], [place] [address]     °336-899-1550      °Admissions: 8am-3pm M-F ° °[facility] ([facility]) °[address] °[place],  [place] °336-227-7417 ° °[facility]: Residential Program (90 Days)   °[place], [place]      °336-725-8389 or [phone]    ° °[facility] °[place], [place] °(Walk in Hours over the weekend or by referral) ° °[facility] [facility] °[address], [place],  [address] °(336) [phone] ° °Crisis Mobile: [facility]:  1-877-626-1772 (for crisis response 24 hours a day) °[facility] Hotline:      1-800-256-2452 °Outpatient Psychiatry and Counseling ° °[facility]: Mobile Crisis   Management 24 hours:  1-877-626-1772 ° °[facility] sliding scale fee and walk in schedule: M-F 8am-12pm/1pm-3pm °[address]  °[place], [place] [address] °[phone] ° °[facility] °[address] °[place], [place] [address] °336-703-9650 ° °[facility] ([facility] new patient walk-in appointments available Monday - Friday 8am -3pm.          °[address] °[place], [place] [address] °336-676-6840 or crisis line- 336-676-6905 ° °[facility]/ Intensive Outpatient Therapy Program °[address] °[place], [place] [address] °336-832-9804 ° °[facility]                  °Crisis Services      °336.641.4993      °[address]     °[place], [place] [address]                ° °[facility]   °[facility] °[phone] °[address] °[place], [place] [address] ° ° °[facility]          °[address],  °[place], [place] [address]       °(336) [phone] ° °[facility] °[address] °[place], [place] [address] °336-292-1510 ° °[facility]    °[address]    °[place], [place] [address]     °336-632-3505      ° °Parish McKinney, MD     °[address]     °[place] [address]     °336-282-1251     °  °[facility] °[address] °[place] [address] ° °[facility]     °[address]     °[place], [place]      °336-542-2076      ° °[facility] °Shamsher Ahluwalia, MD °[address] °[place], [place] [address] °336-420-9558 ° °[facility]     °[address]     °[place], [place] [address]     °336-274-1237      ° °[facility] °[address] °[place], [place] [address] °336-854-4450 °Resources for Temporary Residential Assistance/Crisis Centers ° °DAY CENTERS °[facility] °M-F 8am-3pm   °[address] [place], [place] [address]   336-332-0824 °Services include: laundry, barbering, support groups, case management, phone  & computer access, showers, AA/NA mtgs, mental health/substance abuse nurse, job skills class, disability information, VA assistance, spiritual classes, etc.  ° °HOMELESS SHELTERS ° °[facility]     °[facility]   °[address], [place]     °336.271.5959       °       °[facility] ([facility] and children)       °[address] °[place], [place] [address] °336-275-0820 °[email or web address] for application and process °Application Required ° °[facility]   °[address]    °[place] [address]     °336.886.4922       °             °[facility] °[address] °[place], [place] [address] °336.273.5572 °336-235-0363(schedule application appt.) °Application Required ° °[facility] (women only)    °[address]     °[place], [place] [address]     °336-884-1039      °  Intake starts 6pm daily °Need valid ID, SSC, & Police report °[facility] [place] °[address] °[place], [place] °336-881-5420 °Application Required ° °[facility] (men only)     °[address]      °[place], [place]     °336.748.1962      ° °[facility] °(Pregnant women only) °[address] °[place], [place] °336-275-0206 ° °[facility]      °[address]      °[place], [place] [address]     °336-722-9951      °       °[facility] °[address] °[place], [place] °336-723-1848 °90 day commitment/SA/Application process ° °[facility](men only)     °[address]     °[place], [place]     °336-748-1962       °Check-in at 7pm     °       °[facility] °[address] °[place], [place] [address] °336-248-6684 °Men/Women/Women and Children must be there by 7 pm ° °[facility] °[place],  °336-722-8721                ° °

## 2015-06-02 NOTE — ED Provider Notes (Signed)
CSN: [id number]     Arrival date & time 06/02/15  1932 History   First MD Initiated Contact with Patient 06/02/15 2214     Chief Complaint  Patient presents with  . Drug Problem     (Consider location/radiation/quality/duration/timing/severity/associated sxs/prior Treatment) HPI Comments: Patient here requesting detox from cocaine. Last use was 2 days ago. Denies any current psychiatric complaints of. No suicidal or homicidal ideations. Denies any chest pain or shortness of breath. No auditory of visual hallucinations. Denies any concurrent use of alcohol. Called in outpatient rehabilitation facility and was told to come here for medical clearance  Patient is a 29 y.o. female presenting with drug problem. The history is provided by the patient and a parent.  Drug Problem    Past Medical History  Diagnosis Date  . Urinary tract infection   . Cocaine abuse    History reviewed. No pertinent past surgical history. No family history on file. Social History  Substance Use Topics  . Smoking status: Current Every Day Smoker  . Smokeless tobacco: None  . Alcohol Use: Yes     Comment: occ   OB History    No data available     Review of Systems  All other systems reviewed and are negative.     Allergies  Review of patient's allergies indicates no known allergies.  Home Medications   Prior to Admission medications   Medication Sig Start Date End Date Taking? Authorizing Provider  HYDROcodone-acetaminophen (NORCO/VICODIN) 5-325 MG per tablet Take 1-2 tablets by mouth every 4 (four) hours as needed. 03/20/14   Charlann Lange, PA-C  naproxen (NAPROSYN) 500 MG tablet Take 1 tablet (500 mg total) by mouth 2 (two) times daily with a meal. 05/27/15   Larene Pickett, PA-C  penicillin v potassium (VEETID) 500 MG tablet Take 1 tablet (500 mg total) by mouth 3 (three) times daily. 03/20/14   Shari Upstill, PA-C   BP 93/70 mmHg  Pulse 89  Temp(Src) 98.6 F (37 C) (Oral)  Resp 18  SpO2  99%  LMP 05/06/2015 Physical Exam  Constitutional: She is oriented to person, place, and time. She appears well-developed and well-nourished.  Non-toxic appearance. No distress.  HENT:  Head: Normocephalic and atraumatic.  Eyes: Conjunctivae, EOM and lids are normal. Pupils are equal, round, and reactive to light.  Neck: Normal range of motion. Neck supple. No tracheal deviation present. No thyroid mass present.  Cardiovascular: Normal rate, regular rhythm and normal heart sounds.  Exam reveals no gallop.   No murmur heard. Pulmonary/Chest: Effort normal and breath sounds normal. No stridor. No respiratory distress. She has no decreased breath sounds. She has no wheezes. She has no rhonchi. She has no rales.  Abdominal: Soft. Normal appearance and bowel sounds are normal. She exhibits no distension. There is no tenderness. There is no rebound and no CVA tenderness.  Musculoskeletal: Normal range of motion. She exhibits no edema or tenderness.  Neurological: She is alert and oriented to person, place, and time. She has normal strength. No cranial nerve deficit or sensory deficit. GCS eye subscore is 4. GCS verbal subscore is 5. GCS motor subscore is 6.  Skin: Skin is warm and dry. No abrasion and no rash noted.  Psychiatric: She has a normal mood and affect. Her speech is normal and behavior is normal. She expresses no suicidal plans and no homicidal plans.  Nursing note and vitals reviewed.   ED Course  Procedures (including critical care time) Labs Review Labs Reviewed  COMPREHENSIVE METABOLIC PANEL - Abnormal; Notable for the following:    Glucose, Bld 103 (*)    Calcium 8.3 (*)    Total Protein 5.1 (*)    Albumin 3.1 (*)    ALT 12 (*)    Alkaline Phosphatase 34 (*)    Total Bilirubin 0.2 (*)    All other components within normal limits  CBC - Abnormal; Notable for the following:    WBC 13.8 (*)    All other components within normal limits  URINE RAPID DRUG SCREEN, HOSP PERFORMED  - Abnormal; Notable for the following:    Cocaine POSITIVE (*)    Tetrahydrocannabinol POSITIVE (*)    All other components within normal limits  ETHANOL    Imaging Review No results found. I have personally reviewed and evaluated these images and lab results as part of my medical decision-making.   EKG Interpretation None      MDM   Final diagnoses:  Cocaine dependence without complication [facility])   Patient has no evidence of acute psychiatric illness at this time. She is stable for discharge with outpatient resources Take the copy of your labs with you as proof of medical clearance when you seek rehabilitation services    Lacretia Leigh, MD 06/02/15 2236

## 2015-09-30 ENCOUNTER — Emergency Department (HOSPITAL_COMMUNITY): Payer: Self-pay

## 2015-09-30 ENCOUNTER — Encounter (HOSPITAL_COMMUNITY): Payer: Self-pay | Admitting: Nurse Practitioner

## 2015-09-30 ENCOUNTER — Emergency Department (HOSPITAL_COMMUNITY)
Admission: EM | Admit: 2015-09-30 | Discharge: 2015-09-30 | Disposition: A | Discharge status: Home / Self Care | Payer: Self-pay | Attending: Emergency Medicine | Admitting: Emergency Medicine

## 2015-09-30 DIAGNOSIS — F17200 Nicotine dependence, unspecified, uncomplicated: Secondary | ICD-10-CM | POA: Insufficient documentation

## 2015-09-30 DIAGNOSIS — Y9289 Other specified places as the place of occurrence of the external cause: Secondary | ICD-10-CM | POA: Insufficient documentation

## 2015-09-30 DIAGNOSIS — S82192A Other fracture of upper end of left tibia, initial encounter for closed fracture: Secondary | ICD-10-CM | POA: Insufficient documentation

## 2015-09-30 DIAGNOSIS — Y9389 Activity, other specified: Secondary | ICD-10-CM | POA: Insufficient documentation

## 2015-09-30 DIAGNOSIS — Z87440 Personal history of urinary (tract) infections: Secondary | ICD-10-CM | POA: Insufficient documentation

## 2015-09-30 DIAGNOSIS — S82202A Unspecified fracture of shaft of left tibia, initial encounter for closed fracture: Secondary | ICD-10-CM

## 2015-09-30 DIAGNOSIS — Y998 Other external cause status: Secondary | ICD-10-CM | POA: Insufficient documentation

## 2015-09-30 MED ORDER — OXYCODONE-ACETAMINOPHEN 5-325 MG PO TABS: 1.0000 | ORAL_TABLET | ORAL | Status: DC | PRN

## 2015-09-30 MED ORDER — ONDANSETRON HCL 4 MG/2ML IJ SOLN
4.0000 mg | Freq: Once | INTRAMUSCULAR | Status: AC
  Administered 2015-09-30: 4 mg via INTRAVENOUS
  Filled 2015-09-30: qty 2

## 2015-09-30 MED ORDER — HYDROMORPHONE HCL 1 MG/ML IJ SOLN
1.0000 mg | Freq: Once | INTRAMUSCULAR | Status: AC
  Administered 2015-09-30: 1 mg via INTRAVENOUS
  Filled 2015-09-30: qty 1

## 2015-09-30 MED ORDER — OXYCODONE-ACETAMINOPHEN 5-325 MG PO TABS
1.0000 | ORAL_TABLET | Freq: Once | ORAL | Status: AC
  Administered 2015-09-30: 1 via ORAL
  Filled 2015-09-30: qty 1

## 2015-09-30 MED ORDER — NAPROXEN 500 MG PO TABS: 500.0000 mg | ORAL_TABLET | Freq: Two times a day (BID) | ORAL | Status: DC
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
## 2015-09-30 NOTE — ED Notes (Signed)
Bed: WA20 Expected date:  Expected time:  Means of arrival:  Comments: 

## 2015-09-30 NOTE — ED Notes (Signed)
PT did not want to go out in chair stated she was just ready to go home
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
## 2015-09-30 NOTE — ED Notes (Signed)
Patient transported to CT 

## 2015-09-30 NOTE — ED Notes (Signed)
While taking PT back to room PT info nurse and I her crutches was not level right which made her unbalance

## 2015-09-30 NOTE — Discharge Instructions (Signed)
Go to Dr. Damita Dunnings office tomorrow at 9 AM. In the meantime take pain medication as needed. Keep your leg elevated. You may put up to 20 pounds of weight on that leg if you want. Return to the ER for new or worsening symptoms.

## 2015-09-30 NOTE — ED Notes (Signed)
Pt info staff she need to use restroom. Came back to room with chair for PT, PT did take self to restroom. Once bathroom door open I smelled smoke. Asked PT have she been smoking PT stated no and just wanted to go back in room to lay down

## 2015-10-13 ENCOUNTER — Encounter (HOSPITAL_COMMUNITY): Payer: Self-pay

## 2015-10-13 ENCOUNTER — Encounter (HOSPITAL_COMMUNITY)
Admission: RE | Admit: 2015-10-13 | Discharge: 2015-10-13 | Disposition: A | Discharge status: Home / Self Care | Payer: Self-pay | Source: Ambulatory Visit | Attending: Orthopedic Surgery | Admitting: Orthopedic Surgery

## 2015-10-13 DIAGNOSIS — X58XXXA Exposure to other specified factors, initial encounter: Secondary | ICD-10-CM | POA: Insufficient documentation

## 2015-10-13 DIAGNOSIS — S82142A Displaced bicondylar fracture of left tibia, initial encounter for closed fracture: Secondary | ICD-10-CM | POA: Insufficient documentation

## 2015-10-13 DIAGNOSIS — Z01812 Encounter for preprocedural laboratory examination: Secondary | ICD-10-CM | POA: Insufficient documentation

## 2015-10-13 LAB — CBC
HCT: 37.3 % (ref 36.0–46.0)
Hemoglobin: 12.2 g/dL (ref 12.0–15.0)
MCH: 29 pg (ref 26.0–34.0)
MCHC: 32.7 g/dL (ref 30.0–36.0)
MCV: 88.8 fL (ref 78.0–100.0)
PLATELETS: 442 10*3/uL — AB (ref 150–400)
RBC: 4.2 MIL/uL (ref 3.87–5.11)
RDW: 13.8 % (ref 11.5–15.5)
WBC: 8.7 10*3/uL (ref 4.0–10.5)

## 2015-10-13 LAB — COMPREHENSIVE METABOLIC PANEL
ALK PHOS: 54 U/L (ref 38–126)
ALT: 14 U/L (ref 14–54)
AST: 19 U/L (ref 15–41)
Albumin: 3.3 g/dL — ABNORMAL LOW (ref 3.5–5.0)
Anion gap: 7 (ref 5–15)
BUN: 14 mg/dL (ref 6–20)
CALCIUM: 8.7 mg/dL — AB (ref 8.9–10.3)
CO2: 24 mmol/L (ref 22–32)
CREATININE: 0.98 mg/dL (ref 0.44–1.00)
Chloride: 108 mmol/L (ref 101–111)
Glucose, Bld: 85 mg/dL (ref 65–99)
Potassium: 4 mmol/L (ref 3.5–5.1)
SODIUM: 139 mmol/L (ref 135–145)
Total Bilirubin: 0.3 mg/dL (ref 0.3–1.2)
Total Protein: 6.1 g/dL — ABNORMAL LOW (ref 6.5–8.1)

## 2015-10-13 LAB — URINALYSIS, ROUTINE W REFLEX MICROSCOPIC
Bilirubin Urine: NEGATIVE
Glucose, UA: NEGATIVE mg/dL
KETONES UR: NEGATIVE mg/dL
LEUKOCYTES UA: NEGATIVE
NITRITE: NEGATIVE
PROTEIN: NEGATIVE mg/dL
Specific Gravity, Urine: 1.026 (ref 1.005–1.030)
pH: 6 (ref 5.0–8.0)

## 2015-10-13 LAB — URINE MICROSCOPIC-ADD ON

## 2015-10-13 LAB — HCG, SERUM, QUALITATIVE: PREG SERUM: NEGATIVE

## 2015-10-13 NOTE — Pre-Procedure Instructions (Signed)
    Ellen Stone  10/13/2015      [id number], [place] [address] [place] [address] Phone: [phone] Fax: [phone]    Your procedure is scheduled on Tuesday April 4th  Report to [facility] Admitting at 6:00 am  Call this number if you have problems the morning of surgery:  [phone]  If questions prior to surgery date call [phone] between 8 am-4:30 pm   Remember:  Do not eat food or drink liquids after midnight.  Take these medicines the morning of surgery with A SIP OF WATER:pain pill if needed Stop taking all aspirin or aspirin containing products, anti inflammatories such as Ibuprofen, Naproxen, Advil, Motrin, Aleve, vitamins and herbal supplements   Do not wear jewelry, make-up or nail polish.  Do not wear lotions, powders, or perfumes.  You may wear deodorant.  Do not shave 48 hours prior to surgery.     Do not bring valuables to the hospital.  [facility] is not responsible for any belongings or valuables.  Contacts, dentures or bridgework may not be worn into surgery.  Leave your suitcase in the car.  After surgery it may be brought to your room.  Patients discharged the day of surgery will not be allowed to drive home.   Special instructions: Shower with the [facility] as instructed the night before and morning of surgery  Please read over the following fact sheets that you were given. Pain Booklet, Coughing and Deep Breathing and Surgical Site Infection Prevention

## 2015-10-13 NOTE — Progress Notes (Signed)
Pt denies any cardiopulmonary history.  Does not have a primary care physician.   Smokes 1-2 ppd of cigarettes.  Also states has used both cocaine and marijuana this week.  Instructed to stop taking these drugs for safe surgery.

## 2015-10-17 MED ORDER — SODIUM CHLORIDE 0.9 % IV SOLN: INTRAVENOUS | Status: DC

## 2015-10-17 MED ORDER — LACTATED RINGERS IV SOLN: INTRAVENOUS | Status: DC

## 2015-10-17 MED ORDER — CEFAZOLIN SODIUM-DEXTROSE 2-4 GM/100ML-% IV SOLN
2.0000 g | INTRAVENOUS | Status: AC
  Administered 2015-10-18: 2 g via INTRAVENOUS
  Filled 2015-10-17: qty 100

## 2015-10-18 ENCOUNTER — Inpatient Hospital Stay (HOSPITAL_COMMUNITY): Payer: Self-pay

## 2015-10-18 ENCOUNTER — Encounter (HOSPITAL_COMMUNITY)
Admission: RE | Disposition: A | Discharge status: Home / Self Care | Payer: Self-pay | Source: Ambulatory Visit | Attending: Orthopedic Surgery

## 2015-10-18 ENCOUNTER — Encounter (HOSPITAL_COMMUNITY): Payer: Self-pay | Admitting: Certified Registered Nurse Anesthetist

## 2015-10-18 ENCOUNTER — Inpatient Hospital Stay (HOSPITAL_COMMUNITY)
Admission: RE | Admit: 2015-10-18 | Discharge: 2015-10-20 | DRG: 493 | Disposition: A | Discharge status: Home / Self Care | Payer: Self-pay | Source: Ambulatory Visit | Attending: Orthopedic Surgery | Admitting: Orthopedic Surgery

## 2015-10-18 ENCOUNTER — Inpatient Hospital Stay (HOSPITAL_COMMUNITY): Payer: Self-pay | Admitting: Vascular Surgery

## 2015-10-18 ENCOUNTER — Inpatient Hospital Stay (HOSPITAL_COMMUNITY): Payer: Self-pay | Admitting: Certified Registered Nurse Anesthetist

## 2015-10-18 DIAGNOSIS — E559 Vitamin D deficiency, unspecified: Secondary | ICD-10-CM | POA: Diagnosis present

## 2015-10-18 DIAGNOSIS — R52 Pain, unspecified: Secondary | ICD-10-CM

## 2015-10-18 DIAGNOSIS — D62 Acute posthemorrhagic anemia: Secondary | ICD-10-CM | POA: Diagnosis not present

## 2015-10-18 DIAGNOSIS — Z791 Long term (current) use of non-steroidal anti-inflammatories (NSAID): Secondary | ICD-10-CM

## 2015-10-18 DIAGNOSIS — F1410 Cocaine abuse, uncomplicated: Secondary | ICD-10-CM | POA: Diagnosis present

## 2015-10-18 DIAGNOSIS — Z419 Encounter for procedure for purposes other than remedying health state, unspecified: Secondary | ICD-10-CM

## 2015-10-18 DIAGNOSIS — F1910 Other psychoactive substance abuse, uncomplicated: Secondary | ICD-10-CM | POA: Diagnosis present

## 2015-10-18 DIAGNOSIS — M899 Disorder of bone, unspecified: Secondary | ICD-10-CM | POA: Diagnosis present

## 2015-10-18 DIAGNOSIS — S82142A Displaced bicondylar fracture of left tibia, initial encounter for closed fracture: Principal | ICD-10-CM | POA: Diagnosis present

## 2015-10-18 DIAGNOSIS — S82143A Displaced bicondylar fracture of unspecified tibia, initial encounter for closed fracture: Secondary | ICD-10-CM | POA: Diagnosis present

## 2015-10-18 DIAGNOSIS — F17200 Nicotine dependence, unspecified, uncomplicated: Secondary | ICD-10-CM | POA: Diagnosis present

## 2015-10-18 HISTORY — DX: Urinary tract infection, site not specified: N39.0

## 2015-10-18 HISTORY — DX: Other disorders of phosphorus metabolism: E83.39

## 2015-10-18 HISTORY — PX: ORIF TIBIA PLATEAU: SHX2132

## 2015-10-18 HISTORY — DX: Vitamin D deficiency, unspecified: E55.9

## 2015-10-18 HISTORY — PX: ORIF PROXIMAL TIBIAL PLATEAU FRACTURE: SUR953

## 2015-10-18 HISTORY — DX: Unspecified chronic bronchitis: J42

## 2015-10-18 LAB — CBC
HCT: 37.9 % (ref 36.0–46.0)
HEMOGLOBIN: 12.4 g/dL (ref 12.0–15.0)
MCH: 29.2 pg (ref 26.0–34.0)
MCHC: 32.7 g/dL (ref 30.0–36.0)
MCV: 89.2 fL (ref 78.0–100.0)
PLATELETS: 387 10*3/uL (ref 150–400)
RBC: 4.25 MIL/uL (ref 3.87–5.11)
RDW: 14 % (ref 11.5–15.5)
WBC: 19.8 10*3/uL — ABNORMAL HIGH (ref 4.0–10.5)

## 2015-10-18 LAB — CREATININE, SERUM
CREATININE: 0.57 mg/dL (ref 0.44–1.00)
GFR calc Af Amer: >60 mL/min (ref >60)

## 2015-10-18 SURGERY — OPEN REDUCTION INTERNAL FIXATION (ORIF) TIBIAL PLATEAU
Anesthesia: General | Laterality: Left

## 2015-10-18 MED ORDER — MAGNESIUM CITRATE PO SOLN: 1.0000 | Freq: Once | ORAL | Status: DC | PRN

## 2015-10-18 MED ORDER — ROCURONIUM BROMIDE 50 MG/5ML IV SOLN
INTRAVENOUS | Status: AC
  Filled 2015-10-18: qty 1

## 2015-10-18 MED ORDER — CEFAZOLIN SODIUM 1-5 GM-% IV SOLN
1.0000 g | Freq: Four times a day (QID) | INTRAVENOUS | Status: AC
  Administered 2015-10-18 – 2015-10-19 (×3): 1 g via INTRAVENOUS
  Filled 2015-10-18 (×3): qty 50

## 2015-10-18 MED ORDER — ONDANSETRON HCL 4 MG/2ML IJ SOLN
INTRAMUSCULAR | Status: AC
  Filled 2015-10-18: qty 2

## 2015-10-18 MED ORDER — LIDOCAINE HCL (CARDIAC) 20 MG/ML IV SOLN
INTRAVENOUS | Status: AC
  Filled 2015-10-18: qty 5

## 2015-10-18 MED ORDER — FENTANYL CITRATE (PF) 250 MCG/5ML IJ SOLN
INTRAMUSCULAR | Status: AC
  Filled 2015-10-18: qty 5

## 2015-10-18 MED ORDER — ACETAMINOPHEN 325 MG PO TABS: 650.0000 mg | ORAL_TABLET | Freq: Four times a day (QID) | ORAL | Status: DC | PRN

## 2015-10-18 MED ORDER — NICOTINE 21 MG/24HR TD PT24
21.0000 mg | MEDICATED_PATCH | Freq: Every day | TRANSDERMAL | Status: DC
  Administered 2015-10-18 – 2015-10-20 (×3): 21 mg via TRANSDERMAL
  Filled 2015-10-18 (×3): qty 1

## 2015-10-18 MED ORDER — LACTATED RINGERS IV SOLN
INTRAVENOUS | Status: DC
  Administered 2015-10-18 (×2): via INTRAVENOUS

## 2015-10-18 MED ORDER — LACTATED RINGERS IV SOLN: INTRAVENOUS | Status: DC

## 2015-10-18 MED ORDER — MIDAZOLAM HCL 5 MG/5ML IJ SOLN
INTRAMUSCULAR | Status: DC | PRN
  Administered 2015-10-18: 2 mg via INTRAVENOUS

## 2015-10-18 MED ORDER — PROPOFOL 10 MG/ML IV BOLUS
INTRAVENOUS | Status: AC
  Filled 2015-10-18: qty 20

## 2015-10-18 MED ORDER — ENOXAPARIN SODIUM 40 MG/0.4ML ~~LOC~~ SOLN
40.0000 mg | SUBCUTANEOUS | Status: DC
  Administered 2015-10-19 – 2015-10-20 (×2): 40 mg via SUBCUTANEOUS
  Filled 2015-10-18 (×2): qty 0.4

## 2015-10-18 MED ORDER — DEXAMETHASONE SODIUM PHOSPHATE 10 MG/ML IJ SOLN
INTRAMUSCULAR | Status: AC
  Filled 2015-10-18: qty 1

## 2015-10-18 MED ORDER — METOCLOPRAMIDE HCL 5 MG PO TABS: 5.0000 mg | ORAL_TABLET | Freq: Three times a day (TID) | ORAL | Status: DC | PRN

## 2015-10-18 MED ORDER — 0.9 % SODIUM CHLORIDE (POUR BTL) OPTIME
TOPICAL | Status: DC | PRN
  Administered 2015-10-18: 1000 mL

## 2015-10-18 MED ORDER — ONDANSETRON HCL 4 MG PO TABS: 4.0000 mg | ORAL_TABLET | Freq: Four times a day (QID) | ORAL | Status: DC | PRN

## 2015-10-18 MED ORDER — POLYETHYLENE GLYCOL 3350 17 G PO PACK
17.0000 g | PACK | Freq: Every day | ORAL | Status: DC
  Administered 2015-10-18 – 2015-10-20 (×3): 17 g via ORAL
  Filled 2015-10-18 (×3): qty 1

## 2015-10-18 MED ORDER — DEXAMETHASONE SODIUM PHOSPHATE 10 MG/ML IJ SOLN
INTRAMUSCULAR | Status: DC | PRN
  Administered 2015-10-18: 10 mg via INTRAVENOUS

## 2015-10-18 MED ORDER — MIDAZOLAM HCL 2 MG/2ML IJ SOLN
INTRAMUSCULAR | Status: AC
  Filled 2015-10-18: qty 2

## 2015-10-18 MED ORDER — METHOCARBAMOL 500 MG PO TABS
1000.0000 mg | ORAL_TABLET | Freq: Four times a day (QID) | ORAL | Status: DC
  Administered 2015-10-18 – 2015-10-20 (×9): 1000 mg via ORAL
  Filled 2015-10-18 (×8): qty 2

## 2015-10-18 MED ORDER — HYDROMORPHONE HCL 1 MG/ML IJ SOLN
INTRAMUSCULAR | Status: AC
  Administered 2015-10-18: 0.5 mg via INTRAVENOUS
  Filled 2015-10-18: qty 1

## 2015-10-18 MED ORDER — ONDANSETRON HCL 4 MG/2ML IJ SOLN: 4.0000 mg | Freq: Four times a day (QID) | INTRAMUSCULAR | Status: DC | PRN

## 2015-10-18 MED ORDER — HYDROMORPHONE HCL 1 MG/ML IJ SOLN
0.2500 mg | INTRAMUSCULAR | Status: DC | PRN
  Administered 2015-10-18 (×4): 0.5 mg via INTRAVENOUS

## 2015-10-18 MED ORDER — POTASSIUM CHLORIDE IN NACL 20-0.9 MEQ/L-% IV SOLN
INTRAVENOUS | Status: DC
  Administered 2015-10-18: 14:00:00 via INTRAVENOUS
  Filled 2015-10-18: qty 1000

## 2015-10-18 MED ORDER — FENTANYL CITRATE (PF) 100 MCG/2ML IJ SOLN
INTRAMUSCULAR | Status: AC
  Filled 2015-10-18: qty 2

## 2015-10-18 MED ORDER — MIDAZOLAM HCL 2 MG/2ML IJ SOLN
0.2500 mg | Freq: Once | INTRAMUSCULAR | Status: AC
  Administered 2015-10-18: 0.25 mg via INTRAVENOUS

## 2015-10-18 MED ORDER — GLYCOPYRROLATE 0.2 MG/ML IJ SOLN
INTRAMUSCULAR | Status: AC
  Filled 2015-10-18: qty 2

## 2015-10-18 MED ORDER — OXYCODONE-ACETAMINOPHEN 5-325 MG PO TABS
ORAL_TABLET | ORAL | Status: AC
  Filled 2015-10-18: qty 2

## 2015-10-18 MED ORDER — PROPOFOL 10 MG/ML IV BOLUS
INTRAVENOUS | Status: DC | PRN
  Administered 2015-10-18: 150 mg via INTRAVENOUS

## 2015-10-18 MED ORDER — DOCUSATE SODIUM 100 MG PO CAPS
100.0000 mg | ORAL_CAPSULE | Freq: Two times a day (BID) | ORAL | Status: DC
  Administered 2015-10-18 – 2015-10-20 (×5): 100 mg via ORAL
  Filled 2015-10-18 (×5): qty 1

## 2015-10-18 MED ORDER — DEXTROSE 5 % IV SOLN
500.0000 mg | Freq: Four times a day (QID) | INTRAVENOUS | Status: DC
  Filled 2015-10-18 (×8): qty 5

## 2015-10-18 MED ORDER — OXYCODONE-ACETAMINOPHEN 5-325 MG PO TABS
1.0000 | ORAL_TABLET | Freq: Four times a day (QID) | ORAL | Status: DC | PRN
  Administered 2015-10-18 (×2): 2 via ORAL
  Administered 2015-10-18: 1 via ORAL
  Administered 2015-10-19 – 2015-10-20 (×6): 2 via ORAL
  Filled 2015-10-18 (×8): qty 2

## 2015-10-18 MED ORDER — METHOCARBAMOL 500 MG PO TABS
ORAL_TABLET | ORAL | Status: AC
  Filled 2015-10-18: qty 2

## 2015-10-18 MED ORDER — OXYCODONE HCL 5 MG PO TABS
5.0000 mg | ORAL_TABLET | ORAL | Status: DC | PRN
  Administered 2015-10-18 – 2015-10-20 (×10): 10 mg via ORAL
  Filled 2015-10-18 (×10): qty 2

## 2015-10-18 MED ORDER — SUCCINYLCHOLINE CHLORIDE 20 MG/ML IJ SOLN
INTRAMUSCULAR | Status: AC
  Filled 2015-10-18: qty 1

## 2015-10-18 MED ORDER — METOCLOPRAMIDE HCL 5 MG/ML IJ SOLN: 5.0000 mg | Freq: Three times a day (TID) | INTRAMUSCULAR | Status: DC | PRN

## 2015-10-18 MED ORDER — ONDANSETRON HCL 4 MG/2ML IJ SOLN
INTRAMUSCULAR | Status: DC | PRN
  Administered 2015-10-18: 4 mg via INTRAVENOUS

## 2015-10-18 MED ORDER — PROPOFOL 10 MG/ML IV BOLUS
INTRAVENOUS | Status: AC
  Filled 2015-10-18: qty 40

## 2015-10-18 MED ORDER — MEPERIDINE HCL 25 MG/ML IJ SOLN
6.2500 mg | INTRAMUSCULAR | Status: DC | PRN
Start: 2015-10-18 — End: 2015-10-18

## 2015-10-18 MED ORDER — ROCURONIUM BROMIDE 100 MG/10ML IV SOLN
INTRAVENOUS | Status: DC | PRN
  Administered 2015-10-18: 40 mg via INTRAVENOUS
  Administered 2015-10-18 (×2): 20 mg via INTRAVENOUS
  Administered 2015-10-18: 10 mg via INTRAVENOUS

## 2015-10-18 MED ORDER — GLYCOPYRROLATE 0.2 MG/ML IJ SOLN
INTRAMUSCULAR | Status: DC | PRN
Start: 2015-10-18 — End: 2015-10-18
  Administered 2015-10-18: .4 mg via INTRAVENOUS

## 2015-10-18 MED ORDER — FENTANYL CITRATE (PF) 100 MCG/2ML IJ SOLN
INTRAMUSCULAR | Status: DC | PRN
  Administered 2015-10-18 (×10): 50 ug via INTRAVENOUS
  Administered 2015-10-18: 150 ug via INTRAVENOUS
  Administered 2015-10-18 (×2): 50 ug via INTRAVENOUS

## 2015-10-18 MED ORDER — HYDROMORPHONE HCL 1 MG/ML IJ SOLN
1.0000 mg | INTRAMUSCULAR | Status: DC | PRN
  Administered 2015-10-18 – 2015-10-19 (×2): 1 mg via INTRAVENOUS
  Filled 2015-10-18 (×2): qty 1

## 2015-10-18 MED ORDER — LIDOCAINE HCL (CARDIAC) 20 MG/ML IV SOLN
INTRAVENOUS | Status: DC | PRN
  Administered 2015-10-18: 60 mg via INTRAVENOUS

## 2015-10-18 MED ORDER — NEOSTIGMINE METHYLSULFATE 10 MG/10ML IV SOLN
INTRAVENOUS | Status: DC | PRN
  Administered 2015-10-18: 3 mg via INTRAVENOUS

## 2015-10-18 MED ORDER — DIPHENHYDRAMINE HCL 12.5 MG/5ML PO ELIX: 12.5000 mg | ORAL_SOLUTION | ORAL | Status: DC | PRN

## 2015-10-18 MED ORDER — BISACODYL 5 MG PO TBEC
5.0000 mg | DELAYED_RELEASE_TABLET | Freq: Every day | ORAL | Status: DC | PRN
Start: 2015-10-18 — End: 2015-10-20

## 2015-10-18 MED ORDER — ACETAMINOPHEN 650 MG RE SUPP: 650.0000 mg | Freq: Four times a day (QID) | RECTAL | Status: DC | PRN

## 2015-10-18 SURGICAL SUPPLY — 94 items
BANDAGE ELASTIC 4 VELCRO ST LF (GAUZE/BANDAGES/DRESSINGS) ×3 IMPLANT
BANDAGE ELASTIC 6 VELCRO ST LF (GAUZE/BANDAGES/DRESSINGS) ×3 IMPLANT
BANDAGE ESMARK 6X9 LF (GAUZE/BANDAGES/DRESSINGS) ×1 IMPLANT
BLADE SURG 10 STRL SS (BLADE) ×1 IMPLANT
BLADE SURG 15 STRL LF DISP TIS (BLADE) ×1 IMPLANT
BLADE SURG 15 STRL SS (BLADE)
BLADE SURG ROTATE 9660 (MISCELLANEOUS) IMPLANT
BNDG CMPR 9X6 STRL LF SNTH (GAUZE/BANDAGES/DRESSINGS) ×1
BNDG COHESIVE 4X5 TAN STRL (GAUZE/BANDAGES/DRESSINGS) ×3 IMPLANT
BNDG ESMARK 6X9 LF (GAUZE/BANDAGES/DRESSINGS) ×3
BNDG GAUZE ELAST 4 BULKY (GAUZE/BANDAGES/DRESSINGS) ×3 IMPLANT
BONE CANC CHIPS 40CC CAN1/2 (Bone Implant) ×3 IMPLANT
BRUSH SCRUB DISP (MISCELLANEOUS) ×6 IMPLANT
CANISTER SUCT 3000ML PPV (MISCELLANEOUS) ×3 IMPLANT
CHIPS CANC BONE 40CC CAN1/2 (Bone Implant) ×1 IMPLANT
COVER MAYO STAND STRL (DRAPES) ×3 IMPLANT
COVER SURGICAL LIGHT HANDLE (MISCELLANEOUS) ×3 IMPLANT
CUFF TOURNIQUET SINGLE 34IN LL (TOURNIQUET CUFF) ×3 IMPLANT
DRAPE C-ARM 42X72 X-RAY (DRAPES) ×3 IMPLANT
DRAPE C-ARMOR (DRAPES) ×3 IMPLANT
DRAPE EXTREMITY T 121X128X90 (DRAPE) IMPLANT
DRAPE INCISE IOBAN 66X45 STRL (DRAPES) ×3 IMPLANT
DRAPE ORTHO SPLIT 77X108 STRL (DRAPES) ×6
DRAPE SURG ORHT 6 SPLT 77X108 (DRAPES) IMPLANT
DRAPE U-SHAPE 47X51 STRL (DRAPES) ×3 IMPLANT
DRILL BIT 2.5X100 214235007 (MISCELLANEOUS) ×2 IMPLANT
DRILL BIT 2.7X100 214235006 DU (MISCELLANEOUS) ×2 IMPLANT
DRSG ADAPTIC 3X8 NADH LF (GAUZE/BANDAGES/DRESSINGS) ×1 IMPLANT
DRSG MEPITEL 4X7.2 (GAUZE/BANDAGES/DRESSINGS) ×2 IMPLANT
DRSG PAD ABDOMINAL 8X10 ST (GAUZE/BANDAGES/DRESSINGS) ×12 IMPLANT
ELECT REM PT RETURN 9FT ADLT (ELECTROSURGICAL) ×3
ELECTRODE REM PT RTRN 9FT ADLT (ELECTROSURGICAL) ×1 IMPLANT
EVACUATOR 1/8 PVC DRAIN (DRAIN) IMPLANT
EVACUATOR 3/16  PVC DRAIN (DRAIN)
EVACUATOR 3/16 PVC DRAIN (DRAIN) IMPLANT
GAUZE SPONGE 4X4 12PLY STRL (GAUZE/BANDAGES/DRESSINGS) ×3 IMPLANT
GLOVE BIO SURGEON STRL SZ7.5 (GLOVE) ×3 IMPLANT
GLOVE BIO SURGEON STRL SZ8 (GLOVE) ×3 IMPLANT
GLOVE BIOGEL PI IND STRL 7.5 (GLOVE) ×1 IMPLANT
GLOVE BIOGEL PI IND STRL 8 (GLOVE) ×1 IMPLANT
GLOVE BIOGEL PI INDICATOR 7.5 (GLOVE) ×2
GLOVE BIOGEL PI INDICATOR 8 (GLOVE) ×2
GOWN STRL REUS W/ TWL LRG LVL3 (GOWN DISPOSABLE) ×2 IMPLANT
GOWN STRL REUS W/ TWL XL LVL3 (GOWN DISPOSABLE) ×1 IMPLANT
GOWN STRL REUS W/TWL LRG LVL3 (GOWN DISPOSABLE) ×6
GOWN STRL REUS W/TWL XL LVL3 (GOWN DISPOSABLE) ×3
GRAFT BNE CHIP CANC 1-8 40 (Bone Implant) IMPLANT
IMMOBILIZER KNEE 22 UNIV (SOFTGOODS) ×3 IMPLANT
KIT BASIN OR (CUSTOM PROCEDURE TRAY) ×3 IMPLANT
KIT ROOM TURNOVER OR (KITS) ×3 IMPLANT
NDL SUT 6 .5 CRC .975X.05 MAYO (NEEDLE) IMPLANT
NEEDLE 22X1 1/2 (OR ONLY) (NEEDLE) IMPLANT
NEEDLE MAYO TAPER (NEEDLE)
NS IRRIG 1000ML POUR BTL (IV SOLUTION) ×3 IMPLANT
PACK ORTHO EXTREMITY (CUSTOM PROCEDURE TRAY) ×3 IMPLANT
PAD ABD 8X10 STRL (GAUZE/BANDAGES/DRESSINGS) ×2 IMPLANT
PAD ARMBOARD 7.5X6 YLW CONV (MISCELLANEOUS) ×6 IMPLANT
PAD CAST 4YDX4 CTTN HI CHSV (CAST SUPPLIES) ×1 IMPLANT
PADDING CAST COTTON 4X4 STRL (CAST SUPPLIES) ×3
PADDING CAST COTTON 6X4 STRL (CAST SUPPLIES) ×3 IMPLANT
PLATE LOCK 5H STD LT PROX TIB (Plate) ×2 IMPLANT
RETRIEVER SUT HEWSON (MISCELLANEOUS) ×4 IMPLANT
SCREW CORTICAL 3.5MM  30MM (Screw) ×2 IMPLANT
SCREW CORTICAL 3.5MM 30MM (Screw) IMPLANT
SCREW CORTICAL 3.5MM 38MM (Screw) ×2 IMPLANT
SCREW CORTICAL 3.5MM 40MM (Screw) ×2 IMPLANT
SCREW LOCK 3.5X70 DIST TIB (Screw) ×2 IMPLANT
SCREW LOCK 3.5X75 816135075 DU (Screw) ×4 IMPLANT
SCREW LOCK 3.5X75 DIST TIB (Screw) ×2 IMPLANT
SCREW LOCK CORT STAR 3.5X56 (Screw) ×2 IMPLANT
SCREW LP 3.5X70MM (Screw) ×2 IMPLANT
SCREW LP 3.5X75MM (Screw) ×2 IMPLANT
SPONGE LAP 18X18 X RAY DECT (DISPOSABLE) ×3 IMPLANT
STAPLER VISISTAT 35W (STAPLE) ×3 IMPLANT
STOCKINETTE IMPERVIOUS LG (DRAPES) ×3 IMPLANT
SUCTION FRAZIER HANDLE 10FR (MISCELLANEOUS) ×2
SUCTION TUBE FRAZIER 10FR DISP (MISCELLANEOUS) ×1 IMPLANT
SUT ETHILON 3 0 PS 1 (SUTURE) IMPLANT
SUT PROLENE 0 CT 2 (SUTURE) ×6 IMPLANT
SUT VIC AB 0 CT1 27 (SUTURE) ×3
SUT VIC AB 0 CT1 27XBRD ANBCTR (SUTURE) ×1 IMPLANT
SUT VIC AB 1 CT1 27 (SUTURE) ×3
SUT VIC AB 1 CT1 27XBRD ANBCTR (SUTURE) ×1 IMPLANT
SUT VIC AB 2-0 CT1 27 (SUTURE) ×6
SUT VIC AB 2-0 CT1 TAPERPNT 27 (SUTURE) ×2 IMPLANT
SYR 20ML ECCENTRIC (SYRINGE) IMPLANT
TOWEL OR 17X24 6PK STRL BLUE (TOWEL DISPOSABLE) ×3 IMPLANT
TOWEL OR 17X26 10 PK STRL BLUE (TOWEL DISPOSABLE) ×6 IMPLANT
TRAY FOLEY CATH 16FRSI W/METER (SET/KITS/TRAYS/PACK) IMPLANT
TUBE CONNECTING 12'X1/4 (SUCTIONS) ×1
TUBE CONNECTING 12X1/4 (SUCTIONS) ×2 IMPLANT
WATER STERILE IRR 1000ML POUR (IV SOLUTION) ×6 IMPLANT
WIRE K 1.6MM 144256 (MISCELLANEOUS) ×2 IMPLANT
YANKAUER SUCT BULB TIP NO VENT (SUCTIONS) ×3 IMPLANT

## 2015-10-18 NOTE — Transfer of Care (Signed)
Immediate Anesthesia Transfer of Care Note  Patient: Ellen Stone  Procedure(s) Performed: Procedure(s): OPEN REDUCTION INTERNAL FIXATION (ORIF) LEFT TIBIAL PLATEAU (Left)  Patient Location: PACU  Anesthesia Type:General  Level of Consciousness: awake, alert , oriented and patient cooperative  Airway & Oxygen Therapy: Patient Spontanous Breathing and Patient connected to nasal cannula oxygen  Post-op Assessment: Report given to RN and Post -op Vital signs reviewed and stable  Post vital signs: Reviewed and stable  Last Vitals:  Filed Vitals:   10/18/15 0635  BP: 104/80  Pulse: 73  Temp: 36.6 C  Resp: 18    Complications: No apparent anesthesia complications

## 2015-10-18 NOTE — Anesthesia Postprocedure Evaluation (Signed)
Anesthesia Post Note  Patient: Ellen Stone  Procedure(s) Performed: Procedure(s) (LRB): OPEN REDUCTION INTERNAL FIXATION (ORIF) LEFT TIBIAL PLATEAU (Left)  Patient location during evaluation: PACU Anesthesia Type: General Level of consciousness: awake and alert Pain management: pain level controlled Vital Signs Assessment: post-procedure vital signs reviewed and stable Respiratory status: spontaneous breathing, nonlabored ventilation, respiratory function stable and patient connected to nasal cannula oxygen Cardiovascular status: blood pressure returned to baseline and stable Postop Assessment: no signs of nausea or vomiting Anesthetic complications: no    Last Vitals:  Filed Vitals:   10/18/15 1215 10/18/15 1230  BP: 141/97   Pulse: 78 65  Temp:    Resp: 13 11    Last Pain:  Filed Vitals:   10/18/15 1233  PainSc: Asleep                 Effie Berkshire

## 2015-10-18 NOTE — Brief Op Note (Signed)
10/18/2015  3:58 PM  PATIENT:  Ellen Stone  30 y.o. female  PRE-OPERATIVE DIAGNOSIS:  LEFT LATERAL TIBIAL PLATEAU AND EMINENCE FRACTURES  POST-OPERATIVE DIAGNOSIS:  LEFT LATERAL TIBIAL PLATEAU AND EMINENCE FRACTURES   PROCEDURE:  Procedure(s): 1. OPEN REDUCTION INTERNAL FIXATION (ORIF) LEFT TIBIAL PLATEAU (Left) 2. OPEN REDUCTION INTERNAL FIXATION (ORIF) LEFT TIBIAL EMINENCE  SURGEON:  Surgeon(s) and Role:    * Altamese [place], MD - Primary  PHYSICIAN ASSISTANT: Ainsley Spinner, PA-C  ANESTHESIA:   general  I/O:  Total I/O In: 1480 [I.V.:1330; IV Piggyback:150] Out: 50 [Blood:50]  SPECIMEN:  No Specimen  TOURNIQUET:   Total Tourniquet Time Documented: Thigh (Left) - 76 minutes Total: Thigh (Left) - 76 minutes   DICTATION: .Other Dictation: Dictation Number [id number]

## 2015-10-18 NOTE — Anesthesia Preprocedure Evaluation (Addendum)
Anesthesia Evaluation  Patient identified by MRN, date of birth, ID band Patient awake    Reviewed: Allergy & Precautions, NPO status , Patient's Chart, lab work & pertinent test results  Airway Mallampati: I  TM Distance: >3 FB Neck ROM: Full    Dental  (+) Teeth Intact   Pulmonary Current Smoker,    breath sounds clear to auscultation       Cardiovascular negative cardio ROS   Rhythm:Regular Rate:Normal     Neuro/Psych negative neurological ROS  negative psych ROS   GI/Hepatic negative GI ROS, Neg liver ROS,   Endo/Other  negative endocrine ROS  Renal/GU negative Renal ROS  negative genitourinary   Musculoskeletal negative musculoskeletal ROS (+)   Abdominal   Peds negative pediatric ROS (+)  Hematology negative hematology ROS (+)   Anesthesia Other Findings   Reproductive/Obstetrics negative OB ROS                            Lab Results  Component Value Date   WBC 8.7 10/13/2015   HGB 12.2 10/13/2015   HCT 37.3 10/13/2015   MCV 88.8 10/13/2015   PLT 442* 10/13/2015   Lab Results  Component Value Date   CREATININE 0.98 10/13/2015   BUN 14 10/13/2015   NA 139 10/13/2015   K 4.0 10/13/2015   CL 108 10/13/2015   CO2 24 10/13/2015   No results found for: INR, PROTIME   Anesthesia Physical Anesthesia Plan  ASA: II  Anesthesia Plan: General   Post-op Pain Management:    Induction: Intravenous  Airway Management Planned: Oral ETT  Additional Equipment:   Intra-op Plan:   Post-operative Plan: Extubation in OR  Informed Consent: I have reviewed the patients History and Physical, chart, labs and discussed the procedure including the risks, benefits and alternatives for the proposed anesthesia with the patient or authorized representative who has indicated his/her understanding and acceptance.   Dental advisory given  Plan Discussed with: CRNA  Anesthesia Plan  Comments: (Ms. Korsmo denies recent cocaine use. )       Anesthesia Quick Evaluation

## 2015-10-18 NOTE — Anesthesia Procedure Notes (Signed)
Procedure Name: Intubation Date/Time: 10/18/2015 8:04 AM Performed by: Salli Quarry Gricelda Foland Pre-anesthesia Checklist: Patient identified, Emergency Drugs available, Suction available and Patient being monitored Patient Re-evaluated:Patient Re-evaluated prior to inductionOxygen Delivery Method: Circle System Utilized Preoxygenation: Pre-oxygenation with 100% oxygen Intubation Type: IV induction Ventilation: Mask ventilation without difficulty Laryngoscope Size: Mac and 3 Grade View: Grade I Tube type: Oral Tube size: 7.0 mm Number of attempts: 1 Airway Equipment and Method: Stylet Placement Confirmation: ETT inserted through vocal cords under direct vision,  positive ETCO2 and breath sounds checked- equal and bilateral Secured at: 22 cm Tube secured with: Tape Dental Injury: Teeth and Oropharynx as per pre-operative assessment

## 2015-10-18 NOTE — Care Management Note (Signed)
Case Management Note  Patient Details  Name: Ellen Stone MRN: [id number] Date of Birth: 09/03/1985  Subjective/Objective:                    Action/Plan:  Will await PT eval  Expected Discharge Date:                  Expected Discharge Plan:     In-House Referral:     Discharge planning Services     Post Acute Care Choice:    Choice offered to:     DME Arranged:    DME Agency:     HH Arranged:    [place] Agency:     Status of Service:  In process, will continue to follow  Medicare Important Message Given:    Date Medicare IM Given:    Medicare IM give by:    Date Additional Medicare IM Given:    Additional Medicare Important Message give by:     If discussed at [place] of Stay Meetings, dates discussed:    Additional Comments:  Marilu Favre, RN 10/18/2015, 1:42 PM
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
## 2015-10-19 ENCOUNTER — Inpatient Hospital Stay (HOSPITAL_COMMUNITY): Payer: Self-pay

## 2015-10-19 ENCOUNTER — Encounter (HOSPITAL_COMMUNITY): Payer: Self-pay | Admitting: Orthopedic Surgery

## 2015-10-19 LAB — BASIC METABOLIC PANEL
Anion gap: 11 (ref 5–15)
CALCIUM: 8.8 mg/dL — AB (ref 8.9–10.3)
CO2: 25 mmol/L (ref 22–32)
CREATININE: 0.65 mg/dL (ref 0.44–1.00)
Chloride: 102 mmol/L (ref 101–111)
GFR calc Af Amer: >60 mL/min (ref >60)
GLUCOSE: 93 mg/dL (ref 65–99)
POTASSIUM: 3.9 mmol/L (ref 3.5–5.1)
SODIUM: 138 mmol/L (ref 135–145)

## 2015-10-19 LAB — CBC
HEMATOCRIT: 35.8 % — AB (ref 36.0–46.0)
Hemoglobin: 12.1 g/dL (ref 12.0–15.0)
MCH: 30.3 pg (ref 26.0–34.0)
MCHC: 33.8 g/dL (ref 30.0–36.0)
MCV: 89.5 fL (ref 78.0–100.0)
Platelets: 437 10*3/uL — ABNORMAL HIGH (ref 150–400)
RBC: 4 MIL/uL (ref 3.87–5.11)
RDW: 14 % (ref 11.5–15.5)
WBC: 12.8 10*3/uL — AB (ref 4.0–10.5)

## 2015-10-19 LAB — VITAMIN D 25 HYDROXY (VIT D DEFICIENCY, FRACTURES): VIT D 25 HYDROXY: 8.1 ng/mL — AB (ref 30.0–100.0)

## 2015-10-19 MED ORDER — VITAMIN D (ERGOCALCIFEROL) 1.25 MG (50000 UNIT) PO CAPS
50000.0000 [IU] | ORAL_CAPSULE | ORAL | Status: DC
  Administered 2015-10-19: 50000 [IU] via ORAL
  Filled 2015-10-19: qty 1

## 2015-10-19 MED ORDER — VITAMIN D 1000 UNITS PO TABS
2000.0000 [IU] | ORAL_TABLET | Freq: Two times a day (BID) | ORAL | Status: DC
  Administered 2015-10-19 – 2015-10-20 (×3): 2000 [IU] via ORAL
  Filled 2015-10-19 (×3): qty 2

## 2015-10-19 MED ORDER — CALCIUM CITRATE 950 (200 CA) MG PO TABS
200.0000 mg | ORAL_TABLET | Freq: Every day | ORAL | Status: DC
  Administered 2015-10-19 – 2015-10-20 (×2): 200 mg via ORAL
  Filled 2015-10-19 (×2): qty 1

## 2015-10-19 MED ORDER — VITAMIN C 500 MG PO TABS
500.0000 mg | ORAL_TABLET | Freq: Every day | ORAL | Status: DC
  Administered 2015-10-19 – 2015-10-20 (×2): 500 mg via ORAL
  Filled 2015-10-19 (×2): qty 1

## 2015-10-19 MED ORDER — PANTOPRAZOLE SODIUM 40 MG PO TBEC
40.0000 mg | DELAYED_RELEASE_TABLET | Freq: Every day | ORAL | Status: DC
  Administered 2015-10-19 – 2015-10-20 (×2): 40 mg via ORAL
  Filled 2015-10-19 (×2): qty 1

## 2015-10-19 MED ORDER — ADULT MULTIVITAMIN W/MINERALS CH
1.0000 | ORAL_TABLET | Freq: Every day | ORAL | Status: DC
  Administered 2015-10-19 – 2015-10-20 (×2): 1 via ORAL
  Filled 2015-10-19 (×2): qty 1
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
## 2015-10-19 NOTE — Progress Notes (Signed)
[facility] Progress Note  Subjective  Tearful States she twisted her leg earlier this morning while sitting on the edge of the bed. Prior this pt has been doing fantastic nsg staff notes pt has been doing very well  No cp, no sob No numbness or tingling   Review of Systems  Constitutional: Negative for fever.  Respiratory: Negative for shortness of breath.   Cardiovascular: Negative for chest pain and palpitations.  Gastrointestinal: Negative for nausea and vomiting.  Neurological: Negative for tingling, sensory change and headaches.     Objective   BP 139/94 mmHg  Pulse 80  Temp(Src) 98.6 F (37 C) (Oral)  Resp 24  Ht '5\' 11"'  (1.803 m)  Wt 66.225 kg (146 lb)  BMI 20.37 kg/m2  SpO2 100%  LMP 09/13/2015  Intake/Output      04/04 0701 - 04/05 0700 04/05 0701 - 04/06 0700   P.O. 480 360   I.V. (mL/kg) 1611.3 (24.3) 948.8 (14.3)   IV Piggyback 150 100   Total Intake(mL/kg) 2241.3 (33.8) 1408.8 (21.3)   Urine (mL/kg/hr) 2350 (1.5) 800 (5)   Stool 0 (0)    Blood 50 (0)    Total Output 2400 800   Net -158.8 +608.8        Urine Occurrence 1 x    Stool Occurrence 0 x      Labs Results for ERMEL, VERNE (MRN [id number]) as of 10/19/2015 09:28  Ref. Range 10/19/2015 05:46  Sodium Latest Ref Range: 135-145 mmol/L 138  Potassium Latest Ref Range: 3.5-5.1 mmol/L 3.9  Chloride Latest Ref Range: 101-111 mmol/L 102  CO2 Latest Ref Range: 22-32 mmol/L 25  BUN Latest Ref Range: 6-20 mg/dL <5 (L)  Creatinine Latest Ref Range: 0.44-1.00 mg/dL 0.65  Calcium Latest Ref Range: 8.9-10.3 mg/dL 8.8 (L)  EGFR (Non-African Amer.) Latest Ref Range: >60 mL/min >60  EGFR (African American) Latest Ref Range: >60 mL/min >60  Glucose Latest Ref Range: 65-99 mg/dL 93  Anion gap Latest Ref Range: 5-15  11  WBC Latest Ref Range: 4.0-10.5 K/uL 12.8 (H)  RBC Latest Ref Range: 3.87-5.11 MIL/uL 4.00  Hemoglobin Latest Ref Range: 12.0-15.0 g/dL 12.1  HCT Latest Ref Range:  36.0-46.0 % 35.8 (L)  MCV Latest Ref Range: 78.0-100.0 fL 89.5  MCH Latest Ref Range: 26.0-34.0 pg 30.3  MCHC Latest Ref Range: 30.0-36.0 g/dL 33.8  RDW Latest Ref Range: 11.5-15.5 % 14.0  Platelets Latest Ref Range: 150-400 K/uL 437 (H)   Results for ARNEISHA, KINCANNON (MRN [id number]) as of 10/19/2015 09:28  Ref. Range 10/18/2015 13:48  Vitamin D, 25-Hydroxy Latest Ref Range: 30.0-100.0 ng/mL 8.1 (L)    Exam  Gen: awake and alert, tearful Lungs: clear anterior fields Cardiac: RRR, S1 and S2 Abd: + BS, NTND  Ext:       Left Lower Extremity   Ace wrap now only around knee    Pt not wearing knee immobilizer  Knee in flexion   Distal motor and sensory functions intact  Ext warm   + DP pulse  Swelling stable     Assessment and Plan   POD/HD#: 1  30 y/o black female with comminuted L tibial plateau fracture s/p ORIF   -L tibia plateau fx s/p ORIF   NWB x 6 weeks  Unrestricted L knee ROM   No pillows under knee at rest, pillows to be placed under ankle to elevate leg and get knee into extension   Will order hinged brace, unlocked  PRAFO boot  to help keep ankle in neutral until pt can move better   Ice and elevate   Dressing change tomorrow   PT/OT evals    pt has been up on her own already   - Pain management:  Percocet, oxy IR and robaxin  IV dilaudid for breakthrough pain   - ABL anemia/Hemodynamics  Stable  - Medical issues   Polysubstance abuse   Will make pain control difficult   - DVT/PE prophylaxis:  Lovenox as inpatient  Will place on ASA at dc  - ID:   periop abx  - Metabolic Bone Disease:  Profound vitamin d deficiency    Check additional bone health labs   Vitamin d, vitamin c and calcium supplementation  - Activity:  NWB L leg  L knee ROM as tolerated  - FEN/GI prophylaxis/Foley/Lines:  Diet as tolerated  Protonix daily   - Dispo:  PT/OT consults  Probable dc home tomorrow     Jari Pigg, PA-C [facility] [phone]  [phone] (O) 10/19/2015 9:26 AM

## 2015-10-19 NOTE — Progress Notes (Signed)
Orthopedic Tech Progress Note Patient Details:  Ellen Stone September 09, 1985 [id number]  Patient ID: Ellen Stone, female   DOB: 06-06-1986, 30 y.o.   MRN: [id number] Called in advanced brace order; spoke with Cletis Athens, Corwin Kuiken 10/19/2015, 10:25 AM

## 2015-10-19 NOTE — Progress Notes (Signed)
Orthopedic Tech Progress Note Patient Details:  Ellen Stone Nov 24, 1985 [id number]  Ortho Devices Type of Ortho Device:  (prafo boot) Ortho Device/Splint Location: lle Ortho Device/Splint Interventions: Application   Yanis Juma 10/19/2015, 12:54 PM

## 2015-10-19 NOTE — Progress Notes (Signed)
Orthopedic Tech Progress Note Patient Details:  Ellen Stone July 29, 1985 [id number]  Ortho Devices Type of Ortho Device:  (prafo boot) Ortho Device/Splint Location: applied ohf to bed Ortho Device/Splint Interventions: Application, Ordered   Braulio Bosch 10/19/2015, 4:51 PM

## 2015-10-20 ENCOUNTER — Encounter (HOSPITAL_COMMUNITY): Payer: Self-pay | Admitting: Orthopedic Surgery

## 2015-10-20 DIAGNOSIS — E559 Vitamin D deficiency, unspecified: Secondary | ICD-10-CM

## 2015-10-20 DIAGNOSIS — F1410 Cocaine abuse, uncomplicated: Secondary | ICD-10-CM | POA: Diagnosis present

## 2015-10-20 HISTORY — DX: Vitamin D deficiency, unspecified: E55.9

## 2015-10-20 HISTORY — DX: Other disorders of phosphorus metabolism: E83.39

## 2015-10-20 LAB — COMPREHENSIVE METABOLIC PANEL
ALBUMIN: 3.6 g/dL (ref 3.5–5.0)
ALK PHOS: 65 U/L (ref 38–126)
ALT: 12 U/L — ABNORMAL LOW (ref 14–54)
AST: 17 U/L (ref 15–41)
Anion gap: 13 (ref 5–15)
BILIRUBIN TOTAL: 0.3 mg/dL (ref 0.3–1.2)
BUN: 7 mg/dL (ref 6–20)
CALCIUM: 9 mg/dL (ref 8.9–10.3)
CO2: 24 mmol/L (ref 22–32)
CREATININE: 0.57 mg/dL (ref 0.44–1.00)
Chloride: 100 mmol/L — ABNORMAL LOW (ref 101–111)
GFR calc Af Amer: >60 mL/min (ref >60)
GFR calc non Af Amer: >60 mL/min (ref >60)
GLUCOSE: 81 mg/dL (ref 65–99)
Potassium: 3.9 mmol/L (ref 3.5–5.1)
Sodium: 137 mmol/L (ref 135–145)
TOTAL PROTEIN: 7.1 g/dL (ref 6.5–8.1)

## 2015-10-20 LAB — TSH: TSH: 2.786 u[IU]/mL (ref 0.350–4.500)

## 2015-10-20 LAB — CBC
HEMATOCRIT: 40.1 % (ref 36.0–46.0)
HEMOGLOBIN: 13.3 g/dL (ref 12.0–15.0)
MCH: 29.7 pg (ref 26.0–34.0)
MCHC: 33.2 g/dL (ref 30.0–36.0)
MCV: 89.5 fL (ref 78.0–100.0)
Platelets: 468 10*3/uL — ABNORMAL HIGH (ref 150–400)
RBC: 4.48 MIL/uL (ref 3.87–5.11)
RDW: 13.9 % (ref 11.5–15.5)
WBC: 9.5 10*3/uL (ref 4.0–10.5)

## 2015-10-20 LAB — PREALBUMIN: Prealbumin: 28.5 mg/dL (ref 18–38)

## 2015-10-20 LAB — PHOSPHORUS: Phosphorus: 5.9 mg/dL — ABNORMAL HIGH (ref 2.5–4.6)

## 2015-10-20 LAB — MAGNESIUM: Magnesium: 1.8 mg/dL (ref 1.7–2.4)

## 2015-10-20 MED ORDER — OXYCODONE-ACETAMINOPHEN 5-325 MG PO TABS: 1.0000 | ORAL_TABLET | Freq: Four times a day (QID) | ORAL | Status: DC | PRN

## 2015-10-20 MED ORDER — ASCORBIC ACID 500 MG PO TABS: 500.0000 mg | ORAL_TABLET | Freq: Every day | ORAL | Status: DC

## 2015-10-20 MED ORDER — ASPIRIN EC 325 MG PO TBEC: 325.0000 mg | DELAYED_RELEASE_TABLET | Freq: Every day | ORAL | Status: DC

## 2015-10-20 MED ORDER — CALCIUM CITRATE 950 (200 CA) MG PO TABS: 200.0000 mg | ORAL_TABLET | Freq: Every day | ORAL | Status: DC

## 2015-10-20 MED ORDER — OXYCODONE HCL 5 MG PO TABS: 5.0000 mg | ORAL_TABLET | ORAL | Status: DC | PRN

## 2015-10-20 MED ORDER — DOCUSATE SODIUM 100 MG PO CAPS: 100.0000 mg | ORAL_CAPSULE | Freq: Two times a day (BID) | ORAL | Status: DC

## 2015-10-20 MED ORDER — VITAMIN D (ERGOCALCIFEROL) 1.25 MG (50000 UNIT) PO CAPS: 50000.0000 [IU] | ORAL_CAPSULE | ORAL | Status: DC

## 2015-10-20 MED ORDER — CHOLECALCIFEROL 50 MCG (2000 UT) PO TABS: 2000.0000 [IU] | ORAL_TABLET | Freq: Two times a day (BID) | ORAL | Status: DC

## 2015-10-20 MED ORDER — METHOCARBAMOL 500 MG PO TABS: 500.0000 mg | ORAL_TABLET | Freq: Four times a day (QID) | ORAL | Status: DC | PRN
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
## 2015-10-20 NOTE — Discharge Instructions (Signed)
[facility] Discharge Instructions   General Discharge Instructions  WEIGHT BEARING STATUS: Nonweightbearing Left Leg  RANGE OF MOTION/ACTIVITY: Left knee range of motion as tolerated  Wound Care: daily dressing changes. See detailed instructions below   Discharge Wound Care Instructions  Do NOT apply any ointments, solutions or lotions to pin sites or surgical wounds.  These prevent needed drainage and even though solutions like hydrogen peroxide kill bacteria, they also damage cells lining the pin sites that help fight infection.  Applying lotions or ointments can keep the wounds moist and can cause them to breakdown and open up as well. This can increase the risk for infection. When in doubt call the office.  Surgical incisions should be dressed daily.  If any drainage is noted, use one layer of adaptic, then gauze, Kerlix, and an ace wrap.  Once the incision is completely dry and without drainage, it may be left open to air out.  Showering may begin 36-48 hours later.  Cleaning gently with soap and water.  Traumatic wounds should be dressed daily as well.    One layer of adaptic, gauze, Kerlix, then ace wrap.  The adaptic can be discontinued once the draining has ceased    If you have a wet to dry dressing: wet the gauze with saline the squeeze as much saline out so the gauze is moist (not soaking wet), place moistened gauze over wound, then place a dry gauze over the moist one, followed by Kerlix wrap, then ace wrap.    PAIN MEDICATION USE AND EXPECTATIONS  You have likely been given narcotic medications to help control your pain.  After a traumatic event that results in an fracture (broken bone) with or without surgery, it is ok to use narcotic pain medications to help control one's pain.  We understand that everyone responds to pain differently and each individual patient will be evaluated on a regular basis for the continued need for narcotic medications. Ideally,  narcotic medication use should last no more than 6-8 weeks (coinciding with fracture healing).   As a patient it is your responsibility as well to monitor narcotic medication use and report the amount and frequency you use these medications when you come to your office visit.   We would also advise that if you are using narcotic medications, you should take a dose prior to therapy to maximize you participation.  IF YOU ARE ON NARCOTIC MEDICATIONS IT IS NOT PERMISSIBLE TO OPERATE A MOTOR VEHICLE (MOTORCYCLE/CAR/TRUCK/MOPED) OR HEAVY MACHINERY DO NOT MIX NARCOTICS WITH OTHER CNS (CENTRAL NERVOUS SYSTEM) DEPRESSANTS SUCH AS ALCOHOL  Diet: as you were eating previously.  Can use over the counter stool softeners and bowel preparations, such as Miralax, to help with bowel movements.  Narcotics can be constipating.  Be sure to drink plenty of fluids    STOP SMOKING OR USING NICOTINE PRODUCTS!!!!  As discussed nicotine severely impairs your body's ability to heal surgical and traumatic wounds but also impairs bone healing.  Wounds and bone heal by forming microscopic blood vessels (angiogenesis) and nicotine is a vasoconstrictor (essentially, shrinks blood vessels).  Therefore, if vasoconstriction occurs to these microscopic blood vessels they essentially disappear and are unable to deliver necessary nutrients to the healing tissue.  This is one modifiable factor that you can do to dramatically increase your chances of healing your injury.    (This means no smoking, no nicotine gum, patches, etc)  DO NOT USE NONSTEROIDAL ANTI-INFLAMMATORY DRUGS (NSAID'S)  Using products such as  Advil (ibuprofen), Aleve (naproxen), Motrin (ibuprofen) for additional pain control during fracture healing can delay and/or prevent the healing response.  If you would like to take over the counter (OTC) medication, Tylenol (acetaminophen) is ok.  However, some narcotic medications that are given for pain control contain  acetaminophen as well. Therefore, you should not exceed more than 4000 mg of tylenol in a day if you do not have liver disease.  Also note that there are may OTC medicines, such as cold medicines and allergy medicines that my contain tylenol as well.  If you have any questions about medications and/or interactions please ask your doctor/PA or your pharmacist.      ICE AND ELEVATE INJURED/OPERATIVE EXTREMITY  Using ice and elevating the injured extremity above your heart can help with swelling and pain control.  Icing in a pulsatile fashion, such as 20 minutes on and 20 minutes off, can be followed.    Do not place ice directly on skin. Make sure there is a barrier between to skin and the ice pack.    Using frozen items such as frozen peas works well as the conform nicely to the are that needs to be iced.  USE AN ACE WRAP OR TED HOSE FOR SWELLING CONTROL  In addition to icing and elevation, Ace wraps or TED hose are used to help limit and resolve swelling.  It is recommended to use Ace wraps or TED hose until you are informed to stop.    When using Ace Wraps start the wrapping distally (farthest away from the body) and wrap proximally (closer to the body)   Example: If you had surgery on your leg or thing and you do not have a splint on, start the ace wrap at the toes and work your way up to the thigh        If you had surgery on your upper extremity and do not have a splint on, start the ace wrap at your fingers and work your way up to the upper arm  IF YOU ARE IN A SPLINT OR CAST DO NOT [place]   If your splint gets wet for any reason please contact the office immediately. You may shower in your splint or cast as long as you keep it dry.  This can be done by wrapping in a cast cover or garbage back (or similar)  Do Not stick any thing down your splint or cast such as pencils, money, or hangers to try and scratch yourself with.  If you feel itchy take benadryl as prescribed on the  bottle for itching  IF YOU ARE IN A CAM BOOT (BLACK BOOT)  You may remove boot periodically. Perform daily dressing changes as noted below.  Wash the liner of the boot regularly and wear a sock when wearing the boot. It is recommended that you sleep in the boot until told otherwise  [place] OR CONCERTS: [phone]
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
## 2015-10-20 NOTE — Care Management Note (Addendum)
Case Management Note  Patient Details  Name: Ellen Stone MRN: [id number] Date of Birth: [birth year]  Subjective/Objective:                    Action/Plan:  Confirmed with patient that she does not have insurance . Asked [place] about home health PT .  Home Health agencies follow Medicaid guidelines for uninsured patients . Patient has a non qualifying Medicaid  DX for home health PT , therefore they are unable to provide HHPT. Patient aware and voiced understanding .Rolling walker ordered . Paged Jari Pigg, PA-C . [facility] letter given and explained . Entered over ride for Percocet or oxycodone dated 10-20-15 to 10-27-15  Expected Discharge Date:                  Expected Discharge Plan:  Home/Self Care  In-House Referral:     Discharge planning Services  CM Consult  Post Acute Care Choice:  Durable Medical Equipment, Home Health Choice offered to:  Patient  DME Arranged:  Walker rolling DME Agency:  [place]:    [place]:     Status of Service:  Completed, signed off  Medicare Important Message Given:    Date Medicare IM Given:    Medicare IM give by:    Date Additional Medicare IM Given:    Additional Medicare Important Message give by:     If discussed at [place] of Stay Meetings, dates discussed:    Additional Comments:  Marilu Favre, RN 10/20/2015, 11:26 AM

## 2015-10-21 LAB — PTH, INTACT AND CALCIUM
Calcium, Total (PTH): 9.2 mg/dL (ref 8.7–10.2)
PTH: 38 pg/mL (ref 15–65)

## 2015-10-21 LAB — CALCIUM, IONIZED: Calcium, Ionized, Serum: 5.1 mg/dL (ref 4.5–5.6)

## 2015-10-24 LAB — NICOTINE/COTININE METABOLITES
Cotinine: 134.4 ng/mL
NICOTINE: 1.7 ng/mL

## 2015-10-27 LAB — DRUG SCREEN 10 W/CONF, SERUM
AMPHETAMINES, IA: NEGATIVE ng/mL
BARBITURATES, IA: NEGATIVE ug/mL
BENZODIAZEPINES, IA: NEGATIVE ng/mL
COCAINE & METABOLITE, IA: NEGATIVE ng/mL
Methadone, IA: NEGATIVE ng/mL
OXYCODONES, IA: POSITIVE ng/mL
Opiates, IA: NEGATIVE ng/mL
PHENCYCLIDINE, IA: NEGATIVE ng/mL
PROPOXYPHENE, IA: NEGATIVE ng/mL
THC(Marijuana) Metabolite, IA: POSITIVE ng/mL

## 2015-10-27 LAB — OXYCODONES,MS,WB/SP RFX
OXYCOCONE: 19.6 ng/mL
OXYMORPHONE: NEGATIVE ng/mL
Oxycodones Confirmation: POSITIVE

## 2015-10-27 LAB — THC,MS,WB/SP RFX
CANNABINOID CONFIRMATION: POSITIVE
Cannabidiol: NEGATIVE ng/mL
Cannabinol: NEGATIVE ng/mL
Carboxy-THC: 29.9 ng/mL
HYDROXY-THC: 1 ng/mL
Tetrahydrocannabinol(THC): 3.2 ng/mL

## 2015-12-22 ENCOUNTER — Emergency Department (HOSPITAL_COMMUNITY)
Admission: EM | Admit: 2015-12-22 | Discharge: 2015-12-22 | Disposition: A | Discharge status: Home / Self Care | Payer: Self-pay | Attending: Emergency Medicine | Admitting: Emergency Medicine

## 2015-12-22 ENCOUNTER — Emergency Department (HOSPITAL_COMMUNITY): Payer: Self-pay

## 2015-12-22 ENCOUNTER — Encounter (HOSPITAL_COMMUNITY): Payer: Self-pay | Admitting: Family Medicine

## 2015-12-22 DIAGNOSIS — T148XXA Other injury of unspecified body region, initial encounter: Secondary | ICD-10-CM

## 2015-12-22 DIAGNOSIS — F17210 Nicotine dependence, cigarettes, uncomplicated: Secondary | ICD-10-CM | POA: Insufficient documentation

## 2015-12-22 DIAGNOSIS — Z7982 Long term (current) use of aspirin: Secondary | ICD-10-CM | POA: Insufficient documentation

## 2015-12-22 DIAGNOSIS — Y929 Unspecified place or not applicable: Secondary | ICD-10-CM | POA: Insufficient documentation

## 2015-12-22 DIAGNOSIS — Y939 Activity, unspecified: Secondary | ICD-10-CM | POA: Insufficient documentation

## 2015-12-22 DIAGNOSIS — X58XXXA Exposure to other specified factors, initial encounter: Secondary | ICD-10-CM | POA: Insufficient documentation

## 2015-12-22 DIAGNOSIS — Z79899 Other long term (current) drug therapy: Secondary | ICD-10-CM | POA: Insufficient documentation

## 2015-12-22 DIAGNOSIS — Y999 Unspecified external cause status: Secondary | ICD-10-CM | POA: Insufficient documentation

## 2015-12-22 DIAGNOSIS — Z791 Long term (current) use of non-steroidal anti-inflammatories (NSAID): Secondary | ICD-10-CM | POA: Insufficient documentation

## 2015-12-22 DIAGNOSIS — N390 Urinary tract infection, site not specified: Secondary | ICD-10-CM | POA: Insufficient documentation

## 2015-12-22 DIAGNOSIS — S39001A Unspecified injury of muscle, fascia and tendon of abdomen, initial encounter: Secondary | ICD-10-CM | POA: Insufficient documentation

## 2015-12-22 LAB — CBC
HCT: 44.3 % (ref 36.0–46.0)
Hemoglobin: 14.7 g/dL (ref 12.0–15.0)
MCH: 29.1 pg (ref 26.0–34.0)
MCHC: 33.2 g/dL (ref 30.0–36.0)
MCV: 87.5 fL (ref 78.0–100.0)
PLATELETS: 333 10*3/uL (ref 150–400)
RBC: 5.06 MIL/uL (ref 3.87–5.11)
RDW: 13.9 % (ref 11.5–15.5)
WBC: 14.4 10*3/uL — ABNORMAL HIGH (ref 4.0–10.5)

## 2015-12-22 LAB — DIFFERENTIAL
Basophils Absolute: 0 10*3/uL (ref 0.0–0.1)
Basophils Relative: 0 %
Eosinophils Absolute: 0 10*3/uL (ref 0.0–0.7)
Eosinophils Relative: 0 %
LYMPHS PCT: 6 %
Lymphs Abs: 0.9 10*3/uL (ref 0.7–4.0)
MONO ABS: 1.5 10*3/uL — AB (ref 0.1–1.0)
Monocytes Relative: 10 %
NEUTROS ABS: 12.1 10*3/uL — AB (ref 1.7–7.7)
Neutrophils Relative %: 84 %

## 2015-12-22 LAB — COMPREHENSIVE METABOLIC PANEL
ALK PHOS: 69 U/L (ref 38–126)
ALT: 8 U/L — AB (ref 14–54)
AST: 15 U/L (ref 15–41)
Albumin: 3.6 g/dL (ref 3.5–5.0)
Anion gap: 6 (ref 5–15)
CALCIUM: 8.9 mg/dL (ref 8.9–10.3)
CHLORIDE: 102 mmol/L (ref 101–111)
CO2: 26 mmol/L (ref 22–32)
CREATININE: 0.87 mg/dL (ref 0.44–1.00)
GFR calc non Af Amer: >60 mL/min (ref >60)
GLUCOSE: 108 mg/dL — AB (ref 65–99)
Potassium: 4 mmol/L (ref 3.5–5.1)
SODIUM: 134 mmol/L — AB (ref 135–145)
Total Bilirubin: 1 mg/dL (ref 0.3–1.2)
Total Protein: 7.4 g/dL (ref 6.5–8.1)

## 2015-12-22 LAB — URINALYSIS, ROUTINE W REFLEX MICROSCOPIC
BILIRUBIN URINE: NEGATIVE
GLUCOSE, UA: NEGATIVE mg/dL
KETONES UR: NEGATIVE mg/dL
Nitrite: NEGATIVE
PROTEIN: 30 mg/dL — AB
Specific Gravity, Urine: 1.012 (ref 1.005–1.030)
pH: 6.5 (ref 5.0–8.0)

## 2015-12-22 LAB — URINE MICROSCOPIC-ADD ON

## 2015-12-22 LAB — I-STAT BETA HCG BLOOD, ED (MC, WL, AP ONLY): I-stat hCG, quantitative: <5 m[IU]/mL (ref <5)

## 2015-12-22 LAB — WET PREP, GENITAL
SPERM: NONE SEEN
Trich, Wet Prep: NONE SEEN
Yeast Wet Prep HPF POC: NONE SEEN

## 2015-12-22 MED ORDER — KETOROLAC TROMETHAMINE 60 MG/2ML IM SOLN
30.0000 mg | Freq: Once | INTRAMUSCULAR | Status: AC
  Administered 2015-12-22: 30 mg via INTRAMUSCULAR
  Filled 2015-12-22: qty 2

## 2015-12-22 MED ORDER — CYCLOBENZAPRINE HCL 10 MG PO TABS
10.0000 mg | ORAL_TABLET | Freq: Once | ORAL | Status: AC
  Administered 2015-12-22: 10 mg via ORAL
  Filled 2015-12-22: qty 1

## 2015-12-22 MED ORDER — NAPROXEN 500 MG PO TABS: 500.0000 mg | ORAL_TABLET | Freq: Two times a day (BID) | ORAL | Status: DC

## 2015-12-22 MED ORDER — SULFAMETHOXAZOLE-TRIMETHOPRIM 800-160 MG PO TABS: 1.0000 | ORAL_TABLET | Freq: Two times a day (BID) | ORAL | Status: AC

## 2015-12-22 MED ORDER — CYCLOBENZAPRINE HCL 10 MG PO TABS: 10.0000 mg | ORAL_TABLET | Freq: Two times a day (BID) | ORAL | Status: DC | PRN

## 2015-12-22 NOTE — Discharge Instructions (Signed)
Do not take the muscle relaxant if driving as it will make you sleepy. Call [facility] to schedule a follow up appointment.  Return here as needed.

## 2015-12-22 NOTE — ED Notes (Signed)
Pt here for lower abd pain. sts hurts with movement and urination. Denies blood.

## 2015-12-22 NOTE — ED Provider Notes (Signed)
CSN: [id number]     Arrival date & time 12/22/15  1111 History  By signing my name below, I, Ellen Stone, attest that this documentation has been prepared under the direction and in the presence of Ellen Baller, NP. Electronically Signed: Eustaquio Stone, ED Scribe. 12/22/2015. 3:08 PM.    Chief Complaint  Patient presents with  . Abdominal Pain    Patient is a 30 y.o. female presenting with abdominal pain. The history is provided by the patient. No language interpreter was used.  Abdominal Pain Pain location: left side/flank. Pain quality: dull and sharp   Pain radiates to:  Does not radiate Pain severity:  Severe Onset quality:  Sudden Duration:  2 days Timing:  Constant Progression:  Unchanged Chronicity:  New Relieved by:  None tried Worsened by:  Movement Ineffective treatments:  None tried Associated symptoms: chills   Associated symptoms: no diarrhea, no fever, no nausea, no vaginal bleeding, no vaginal discharge and no vomiting   Risk factors: not pregnant    HPI Comments: Ellen Stone is a 30 y.o. female who presents to the Emergency Department complaining of sudden, constant, dull, 9/10, left sided abdominal pain for the past 2-3 days. Pt notes pain is exacerbated to a sharp pain with movement. She has no recent injuries to her side. She states she has never had similar symptoms. She reports she has had normal bowel movments and is passing gas. She also reports associated chills. Pt states she has not taken any medication to relieve pain. She denies vaginal discharge, vaginal bleeding, fever, dysuria, frequency, back pain, nausea, vomiting, constipation, or diarrhea. No hx STDs. She is currently not taking birth control pills, but has 3 consistent protected sexual partners for the last 4-5 months. LNMP: 12/02/2015. G0.    Past Medical History  Diagnosis Date  . Recurrent UTI (urinary tract infection)     "none lately; since I started drinking more water" (10/18/2015)  .  Cocaine abuse   . Chronic bronchitis ([place])   . Vitamin D deficiency 10/20/2015  . Hyperphosphatemia 10/20/2015   Past Surgical History  Procedure Laterality Date  . Orif proximal tibial plateau fracture Left 10/18/2015  . Fracture surgery    . Orif tibia plateau Left 10/18/2015    Procedure: OPEN REDUCTION INTERNAL FIXATION (ORIF) LEFT TIBIAL PLATEAU;  Surgeon: Altamese Conchas Dam, MD;  Location: [place];  Service: Orthopedics;  Laterality: Left;   History reviewed. No pertinent family history. Social History  Substance Use Topics  . Smoking status: Current Every Day Smoker -- 1.00 packs/day for 10 years    Types: Cigarettes  . Smokeless tobacco: Never Used  . Alcohol Use: Yes     Comment: 10/18/2015 "down to maybe 1 beer q 2 weeks or so"   OB History    No data available     Review of Systems  Constitutional: Positive for chills. Negative for fever.  Gastrointestinal: Negative for nausea, vomiting and diarrhea.       Left side pain  Genitourinary: Negative for vaginal bleeding and vaginal discharge.       Pain with urination  All other systems reviewed and are negative.   Allergies  Review of patient's allergies indicates no known allergies.  Home Medications   Prior to Admission medications   Medication Sig Start Date End Date Taking? Authorizing Provider  aspirin EC 325 MG tablet Take 1 tablet (325 mg total) by mouth daily. 10/20/15   Ainsley Spinner, PA-C  calcium citrate (CALCITRATE - DOSED IN  MG ELEMENTAL CALCIUM) 950 MG tablet Take 1 tablet (200 mg of elemental calcium total) by mouth daily. 10/20/15   Ainsley Spinner, PA-C  cholecalciferol 2000 units TABS Take 1 tablet (2,000 Units total) by mouth 2 (two) times daily. 10/20/15   Ainsley Spinner, PA-C  cyclobenzaprine (FLEXERIL) 10 MG tablet Take 1 tablet (10 mg total) by mouth 2 (two) times daily as needed for muscle spasms. 12/22/15   Hope Bunnie Pion, NP  docusate sodium (COLACE) 100 MG capsule Take 1 capsule (100 mg total) by mouth 2 (two) times daily.  10/20/15   Ainsley Spinner, PA-C  naproxen (NAPROSYN) 500 MG tablet Take 1 tablet (500 mg total) by mouth 2 (two) times daily. 12/22/15   Hope Bunnie Pion, NP  sulfamethoxazole-trimethoprim (BACTRIM DS,SEPTRA DS) 800-160 MG tablet Take 1 tablet by mouth 2 (two) times daily. 12/22/15 12/29/15  Hope Bunnie Pion, NP  vitamin C (VITAMIN C) 500 MG tablet Take 1 tablet (500 mg total) by mouth daily. 10/20/15   Ainsley Spinner, PA-C  Vitamin D, Ergocalciferol, (DRISDOL) 50000 units CAPS capsule Take 1 capsule (50,000 Units total) by mouth every 7 (seven) days. 10/20/15   Ainsley Spinner, PA-C   BP 110/62 mmHg  Pulse 98  Temp(Src) 99.4 F (37.4 C) (Oral)  Resp 18  SpO2 99%  LMP 12/02/2015 Physical Exam  Constitutional: She is oriented to person, place, and time. She appears well-developed and well-nourished. No distress.  HENT:  Head: Normocephalic and atraumatic.  Eyes: Conjunctivae and EOM are normal.  Neck: Neck supple. No tracheal deviation present.  Cardiovascular: Normal rate and regular rhythm.   Pulmonary/Chest: Effort normal and breath sounds normal. No respiratory distress.  Abdominal: Soft. Bowel sounds are normal. There is no tenderness. There is CVA tenderness.  Genitourinary:  External genitalia without lesions, white d/c vaginal vault. No CMT, no adnexal tenderness or mass palpated, uterus without palpable enlargement.   Musculoskeletal: Normal range of motion. She exhibits tenderness.  Tenderness to left posterior ribs  Neurological: She is alert and oriented to person, place, and time.  Skin: Skin is warm and dry.  Psychiatric: She has a normal mood and affect. Her behavior is normal.  Nursing note and vitals reviewed.   ED Course  Procedures (including critical care time) DIAGNOSTIC STUDIES: Oxygen Saturation is 100% on RA, normal by my interpretation.    COORDINATION OF CARE: 2:40 PM-Discussed treatment plan which includes pelvic exam and CT renal stone study with pt at bedside and pt agreed to plan.    6:30 PM Upon reevaluation pain is 2/10 after medication and is only present with movement and deep breathing.   Labs Review Results for orders placed or performed during the hospital encounter of 12/22/15 (from the past 24 hour(s))  Wet prep, genital     Status: Abnormal   Collection Time: 12/22/15  3:31 PM  Result Value Ref Range   Yeast Wet Prep HPF POC NONE SEEN NONE SEEN   Trich, Wet Prep NONE SEEN NONE SEEN   Clue Cells Wet Prep HPF POC PRESENT (A) NONE SEEN   WBC, Wet Prep HPF POC FEW (A) NONE SEEN   Sperm NONE SEEN      Imaging Review Ct Renal Stone Study  12/22/2015  CLINICAL DATA:  Left flank pain.  Hematuria. EXAM: CT ABDOMEN AND PELVIS WITHOUT CONTRAST TECHNIQUE: Multidetector CT imaging of the abdomen and pelvis was performed following the standard protocol without IV contrast. COMPARISON:  August 21, 2011 CT of the abdomen and pelvis FINDINGS:  The study is limited due to the patient's body habitus/lack of intra-abdominal fat and the lack of contrast. Normal lung bases. No free air. There is a small amount of free fluid adjacent to the posterior liver on series 2, image 41. There is probably a small amount of free fluid in the pelvis is well posteriorly. No renal stones, masses, or hydronephrosis. Calcifications in the pelvis are favored to represent phleboliths. There is no convincing evidence of ureterectasis or ureteral stones. The liver, gallbladder, spleen, adrenal glands, and pancreas are normal. No aneurysm or adenopathy. The stomach and small bowel are normal. The colon is normal in appearance. The appendix is not seen but there is no secondary evidence of appendicitis. No adenopathy or mass in the pelvis. The uterus and adnexae are unremarkable. Visualized bones are unremarkable as well. IMPRESSION: 1. Limited study due to lack of contrast and patient's body habitus/lack of intra-abdominal fat. No renal stones or obstruction are seen. No cause for the patient's symptoms  identified. 2. Small amount of free fluid in the abdomen and pelvis with no underlying cause identified. If concern persists, a CT scan with contrast may be more sensitive and specific. Electronically Signed   By: Dorise Bullion III M.D   On: 12/22/2015 18:23   I have personally reviewed and evaluated the lab results as part of my medical decision-making.   MDM  30 y.o. female with hx of recurrent UTI's and here today with left flank pain that is relieved with flexeril and Toradol stable for d/c without acute abdomen and no stone noted on renal CT. Discussed with the patient Clinical, lab and CT findings and all questioned fully answered. She will f/u with [facility] or return here if any problems arise. Will treat for UTI. Urine sent for Culture.   Final diagnoses:  Muscle strain  UTI (lower urinary tract infection)   I personally performed the services described in this documentation, which was scribed in my presence. The recorded information has been reviewed and is accurate.    [place], [place] 12/23/15 1156  Lacretia Leigh, MD 12/26/15 [phone]

## 2015-12-23 LAB — GC/CHLAMYDIA PROBE AMP (~~LOC~~) NOT AT ARMC
Chlamydia: NEGATIVE
NEISSERIA GONORRHEA: NEGATIVE

## 2015-12-24 LAB — URINE CULTURE

## 2015-12-25 ENCOUNTER — Telehealth (HOSPITAL_BASED_OUTPATIENT_CLINIC_OR_DEPARTMENT_OTHER): Payer: Self-pay

## 2015-12-25 NOTE — Telephone Encounter (Signed)
Post ED Visit - Positive Culture Follow-up: Successful Patient Follow-Up  Culture assessed and recommendations reviewed by: []  Elenor Quinones, Pharm.D. []  Heide Guile, Pharm.D., BCPS []  Parks Neptune, Pharm.D. []  Alycia Rossetti, Pharm.D., BCPS []  [place], Pharm.D., BCPS, AAHIVP []  Legrand Como, Pharm.D., BCPS, AAHIVP [x]  Milus Glazier, Pharm.D. []  Rob Evette Doffing, Pharm.D.  Positive urine culture  []  Patient discharged without antimicrobial prescription and treatment is now indicated [x]  Organism is resistant to prescribed ED discharge antimicrobial []  Patient with positive blood cultures  Changes discussed with ED provider: [name]  PA-C New antibiotic prescription Keflex 500 mg PO BID x 7 days Called to [place]  Contacted patient, date 12/25/15, time 1033   Ellen Stone, [place] Comment 12/25/2015, 10:31 AM

## 2015-12-25 NOTE — Progress Notes (Signed)
ED Antimicrobial Stewardship Positive Culture Follow Up   Ellen Stone is an 30 y.o. female who presented to [facility] on 12/22/2015 with a chief complaint of  Chief Complaint  Patient presents with  . Abdominal Pain    Recent Results (from the past 720 hour(s))  Urine culture     Status: Abnormal   Collection Time: 12/22/15 11:38 AM  Result Value Ref Range Status   Specimen Description URINE, RANDOM  Final   Special Requests NONE  Final   Culture >=100,000 COLONIES/mL ESCHERICHIA COLI (A)  Final   Report Status 12/24/2015 FINAL  Final   Organism ID, Bacteria ESCHERICHIA COLI (A)  Final      Susceptibility   Escherichia coli - MIC*    AMPICILLIN >=32 RESISTANT Resistant     CEFAZOLIN <=4 SENSITIVE Sensitive     CEFTRIAXONE <=1 SENSITIVE Sensitive     CIPROFLOXACIN <=0.25 SENSITIVE Sensitive     GENTAMICIN <=1 SENSITIVE Sensitive     IMIPENEM <=0.25 SENSITIVE Sensitive     NITROFURANTOIN <=16 SENSITIVE Sensitive     TRIMETH/SULFA >=320 RESISTANT Resistant     AMPICILLIN/SULBACTAM 16 INTERMEDIATE Intermediate     PIP/TAZO <=4 SENSITIVE Sensitive     * >=100,000 COLONIES/mL ESCHERICHIA COLI  Wet prep, genital     Status: Abnormal   Collection Time: 12/22/15  3:31 PM  Result Value Ref Range Status   Yeast Wet Prep HPF POC NONE SEEN NONE SEEN Final   Trich, Wet Prep NONE SEEN NONE SEEN Final   Clue Cells Wet Prep HPF POC PRESENT (A) NONE SEEN Final   WBC, Wet Prep HPF POC FEW (A) NONE SEEN Final   Sperm NONE SEEN  Final     [x]  Treated with Bactrim, organism resistant to prescribed antimicrobial  New antibiotic prescription: Stop Bactrim. Take Keflex 500 mg PO BID x 7 days.  ED Provider: [name], PA-C  [name], PharmD PGY2 Infectious Diseases Pharmacy Resident Pager: [phone] 12/25/2015 10:14 AM

## 2017-04-15 IMAGING — RF DG KNEE COMPLETE 4+V*L*
1 series · 5 of 5 positions shown · IV contrast (agent unspecified)
Comparison: 09/30/2015.

CLINICAL DATA: Tibial plateau fracture.

EXAM:
DG C-ARM GT 120 MIN; LEFT KNEE - COMPLETE 4+ VIEW
CONTRAST:  No prior.
FLUOROSCOPY TIME:  Scratched
Fluoroscopy Time (in minutes and seconds):  0 minutes 56 seconds
Number of Acquired Images:  5 images

[Series 1: run · 5 of 5 slices shown]
[im 1/5]
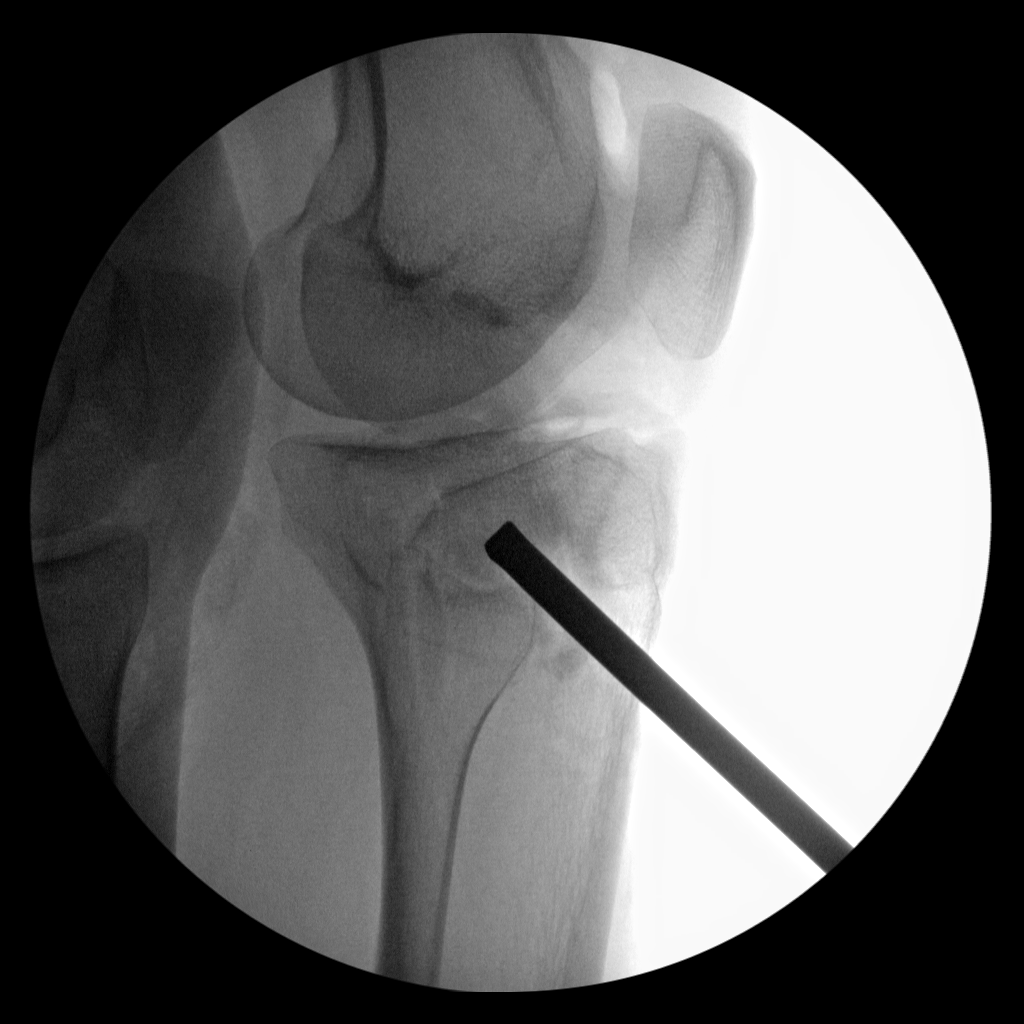
[im 2/5]
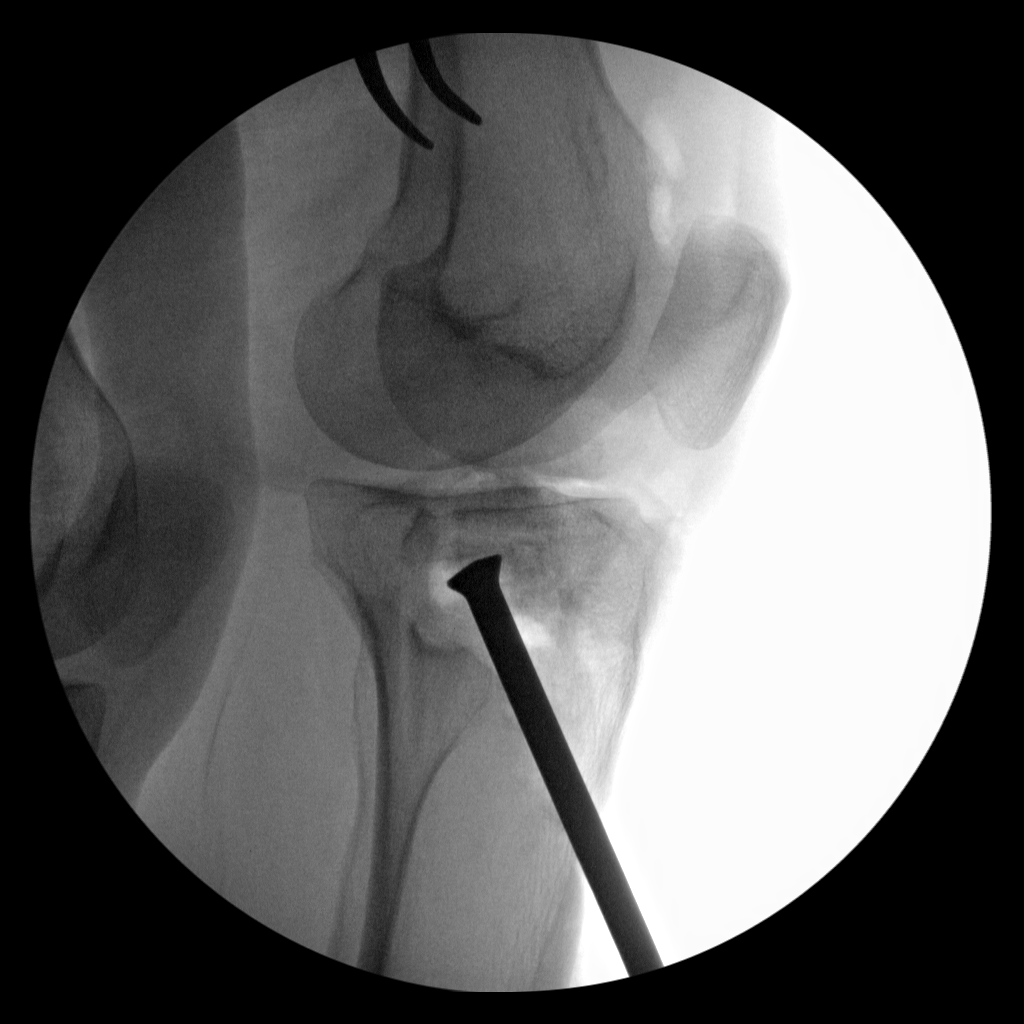
[im 3/5]
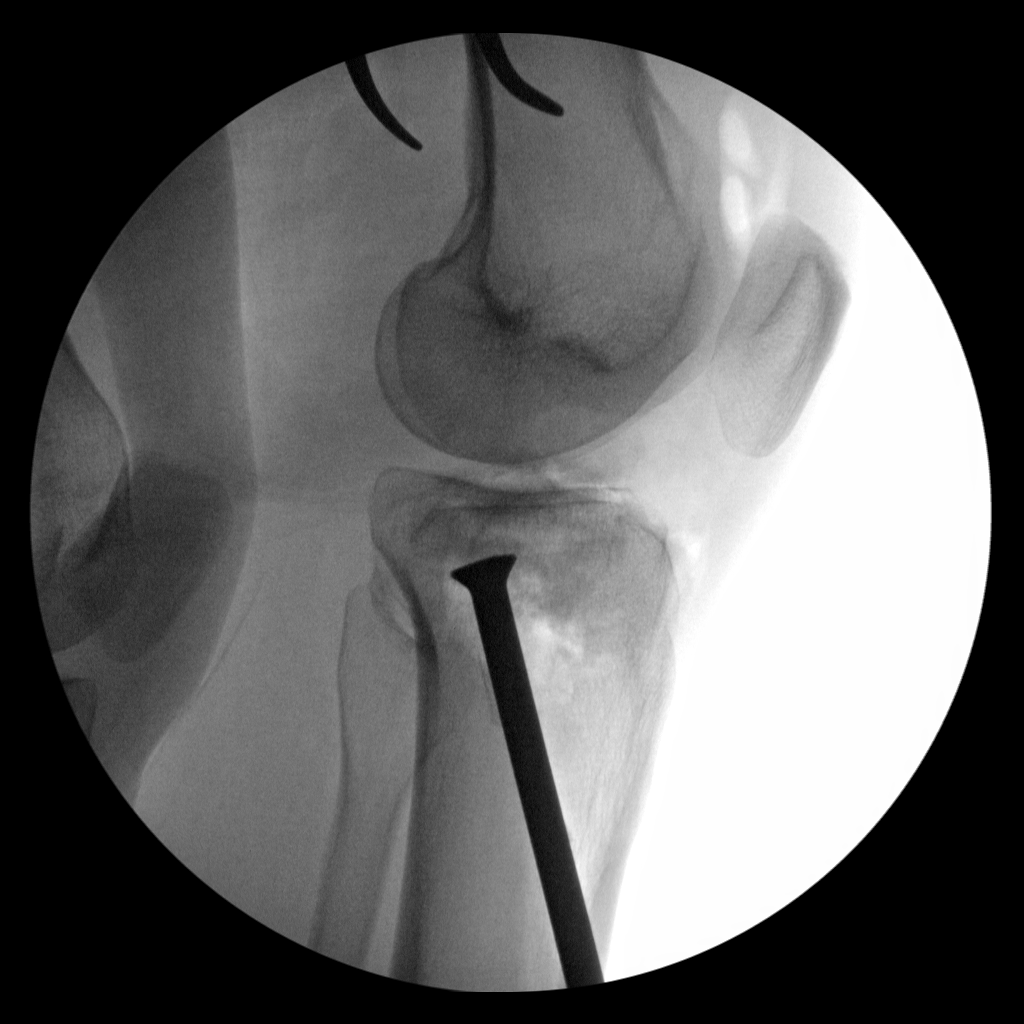
[im 4/5]
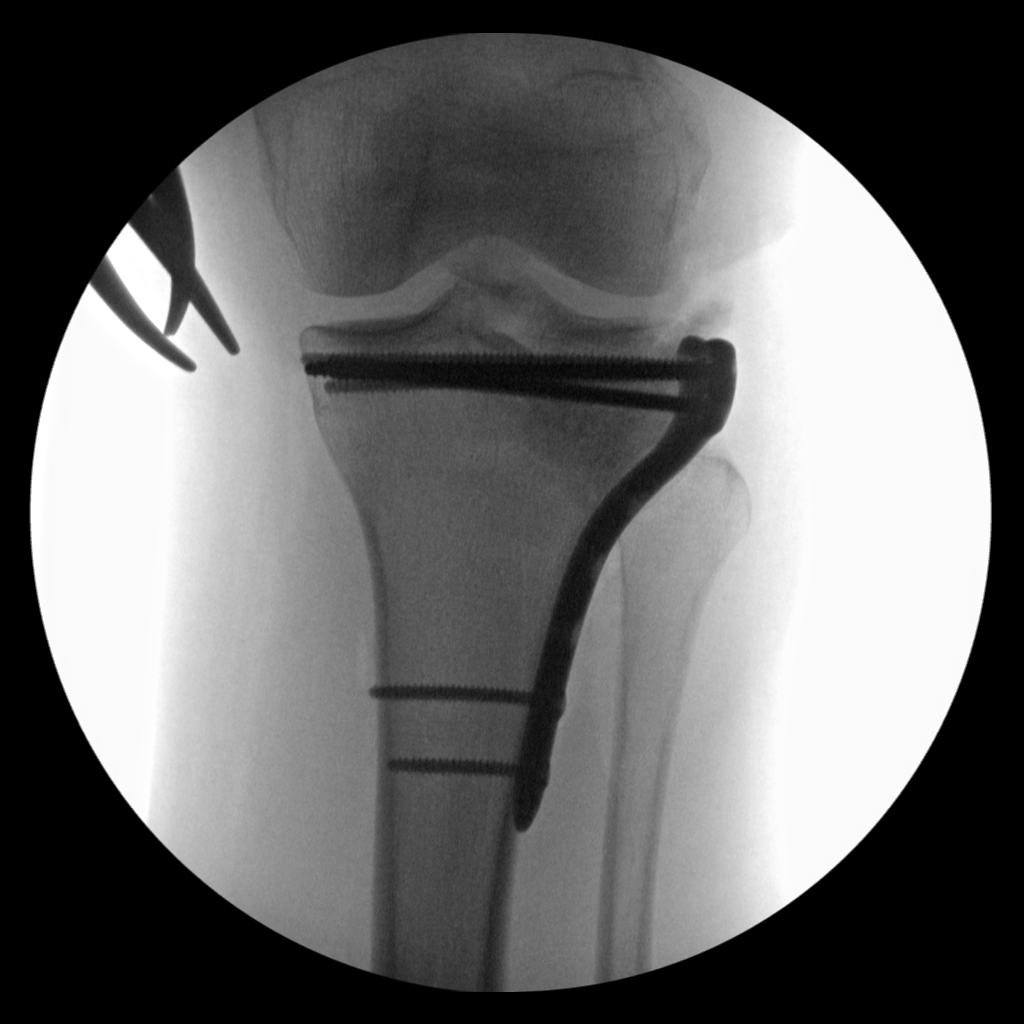
[im 5/5]
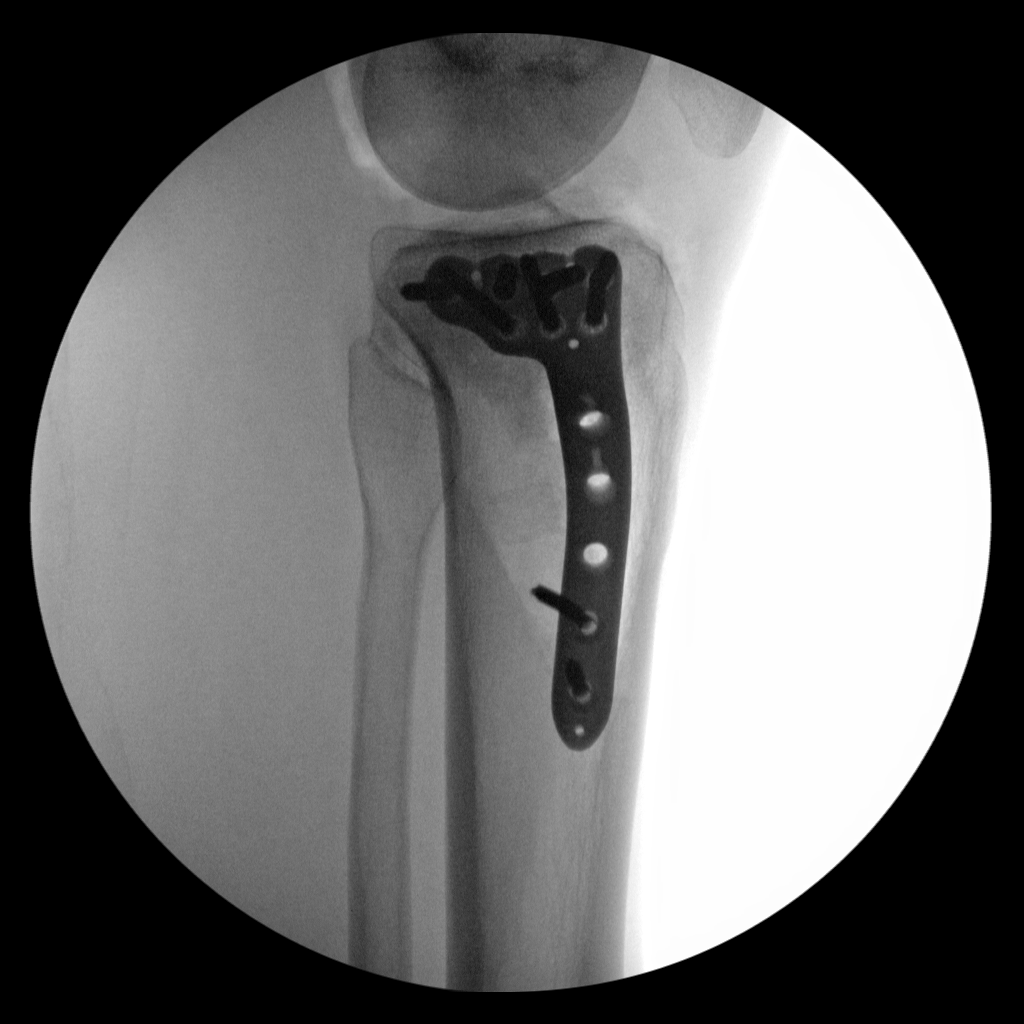

[5 of 5 positions shown; findings below may reference images not displayed]

FINDINGS: Plate and screw fixation of proximal tibial plateau. Good anatomic
alignment of fracture fragments. Hardware intact.
IMPRESSION: ORIF tibial plateau fracture with good anatomic alignment.

## 2017-04-15 IMAGING — CR DG KNEE 1-2V PORT*L*
2 series · 2 of 2 positions shown · non-contrast
Comparison: 09/30/2015

CLINICAL DATA: Status post ORIF of lateral tibial plateau fracture

EXAM:
PORTABLE LEFT KNEE - 1-2 VIEW

[AP]
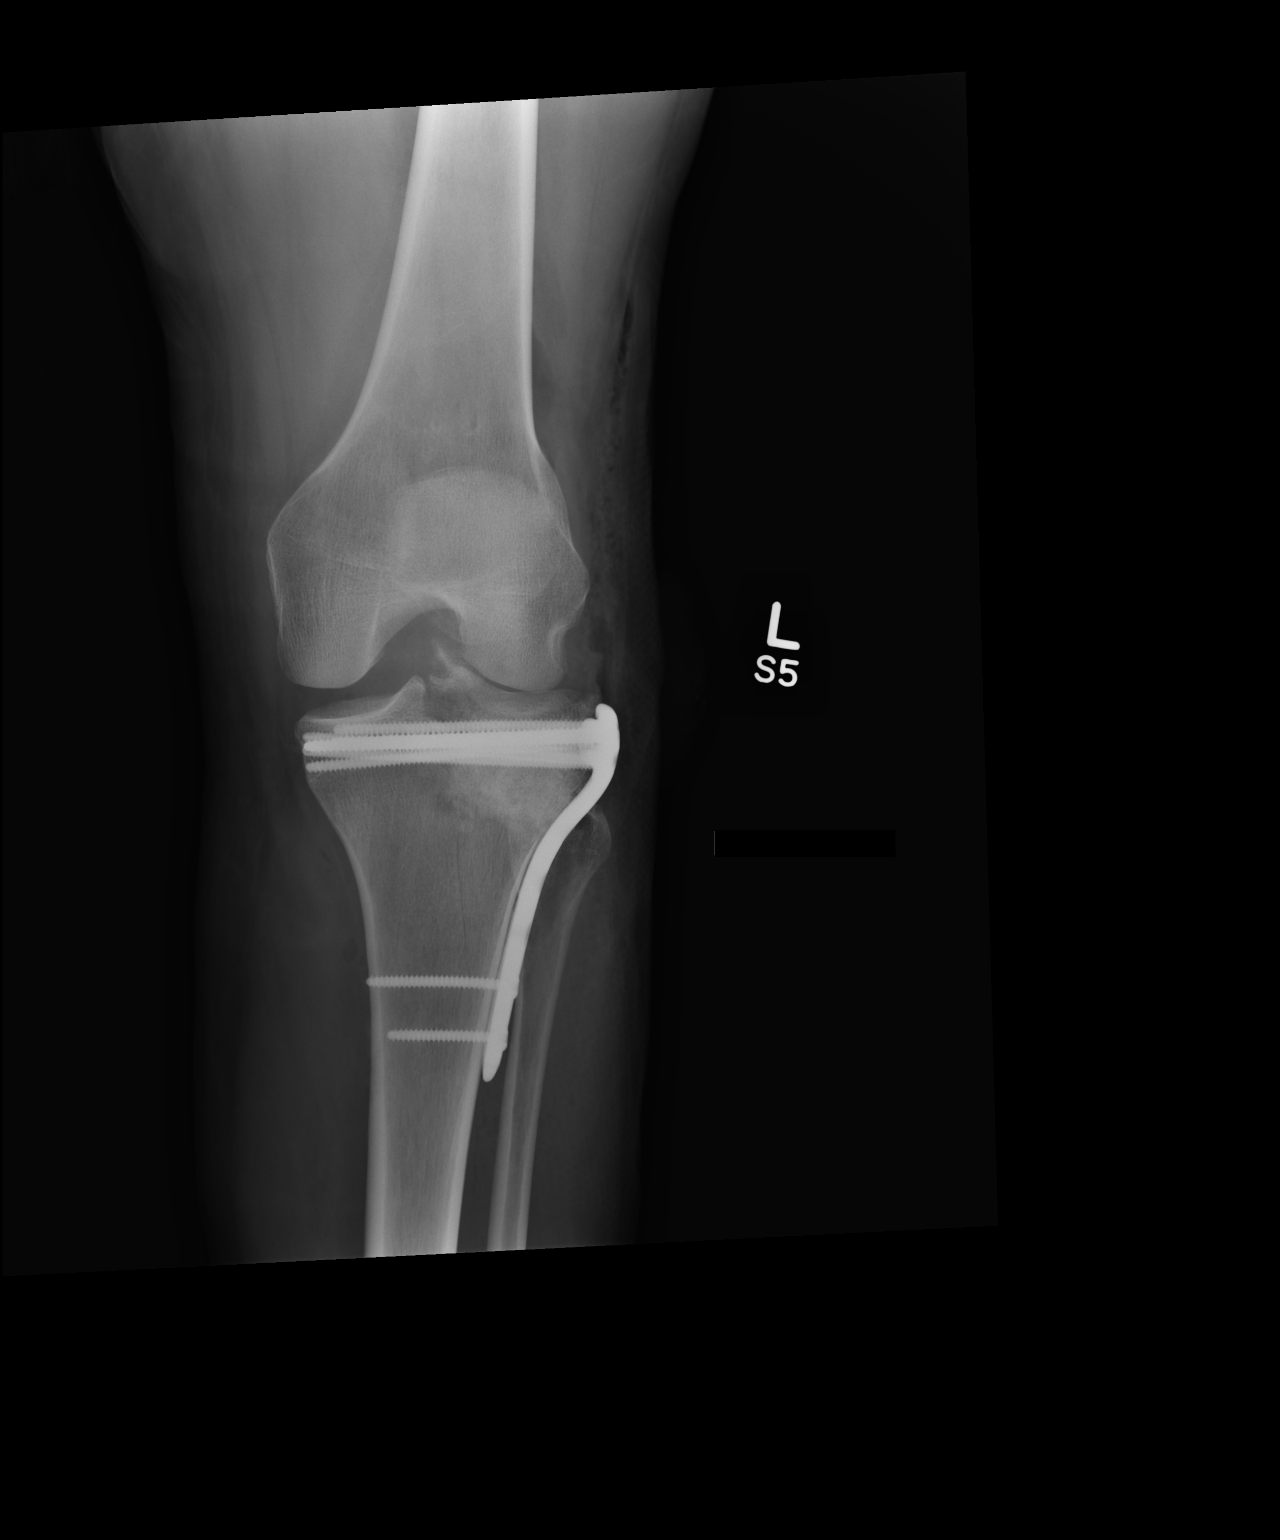

[xtable lateral]
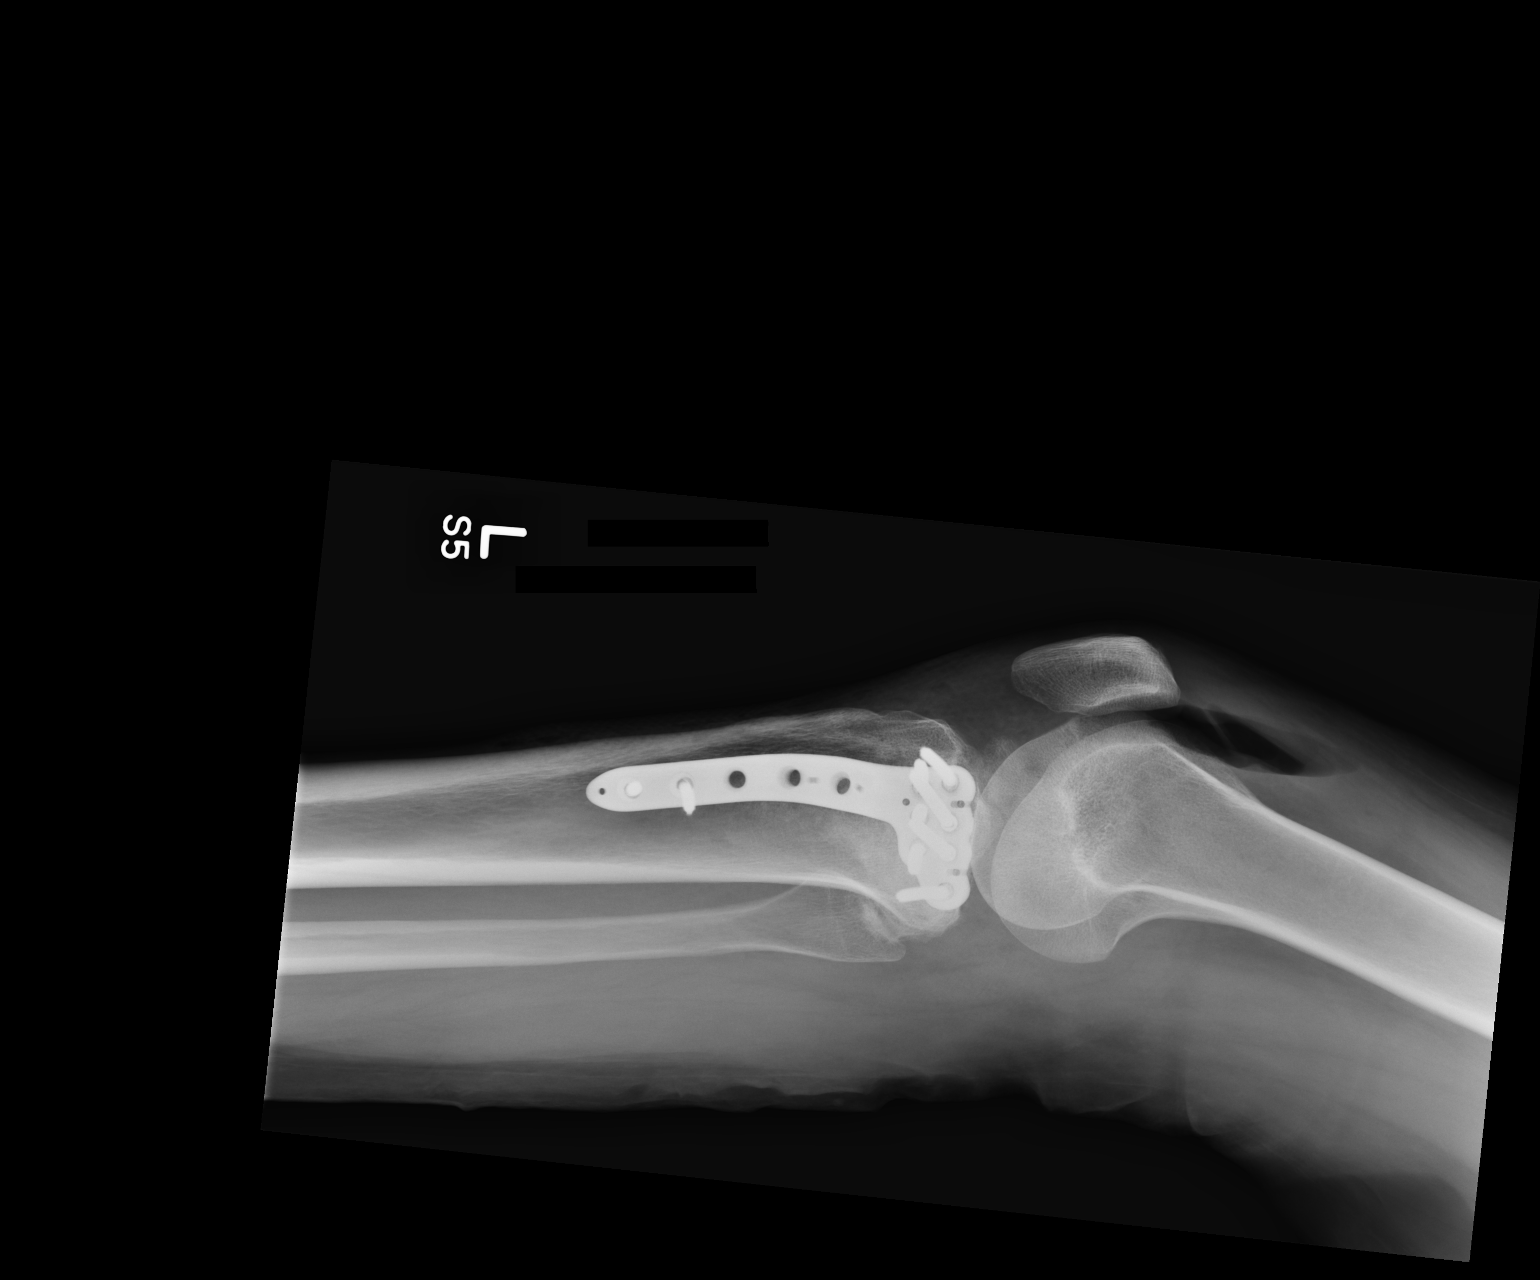

[2 of 2 positions shown; findings below may reference images not displayed]

FINDINGS: Fixation sideplate is now seen along the lateral aspect of the
proximal tibia with multiple fixation screws. Increased density in
the fracture site consistent with bone to placement is noted. Air is
noted within the joint related to the recent surgery.
IMPRESSION: Status post ORIF of lateral tibial plateau fracture

## 2017-04-16 IMAGING — CR DG KNEE 1-2V PORT*L*
2 series · 2 of 2 positions shown · non-contrast
Comparison: 10/18/2015

CLINICAL DATA: Pain, post lateral tibial plateau fracture repair

EXAM:
PORTABLE LEFT KNEE - 1-2 VIEW

[AP]
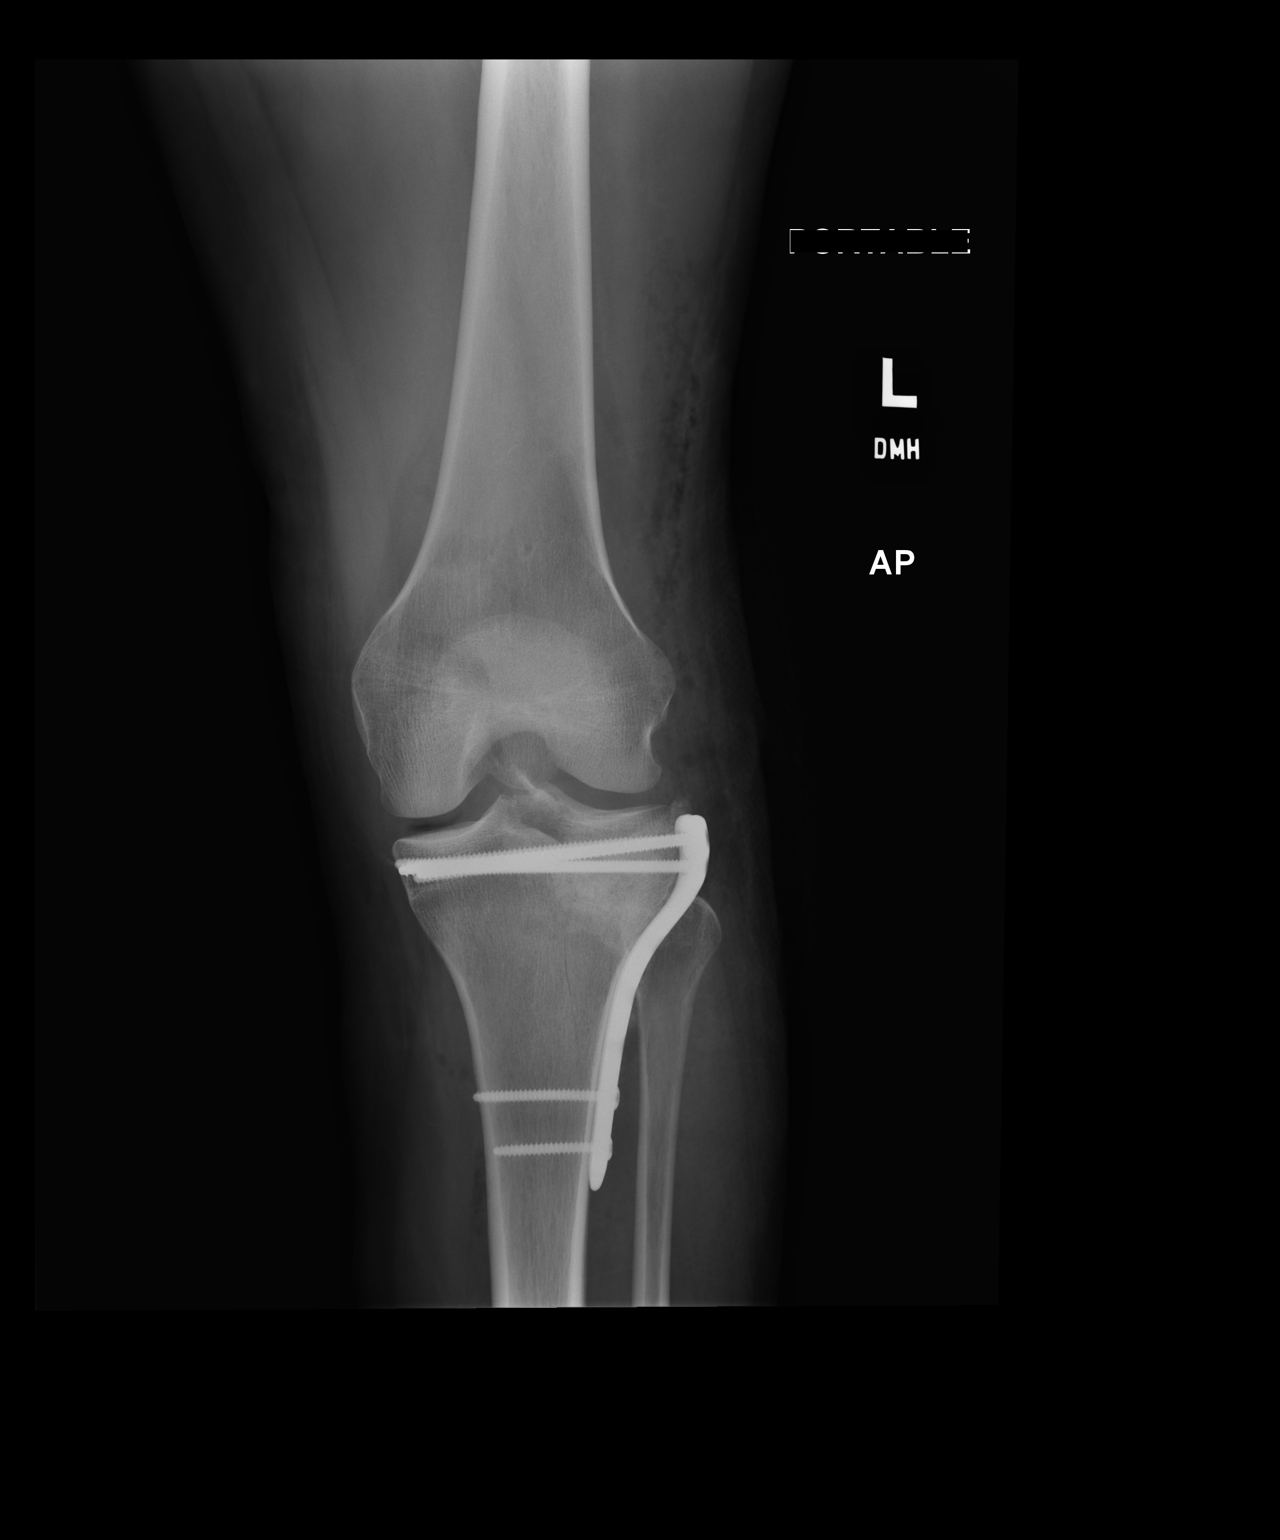

[xtable lateral]
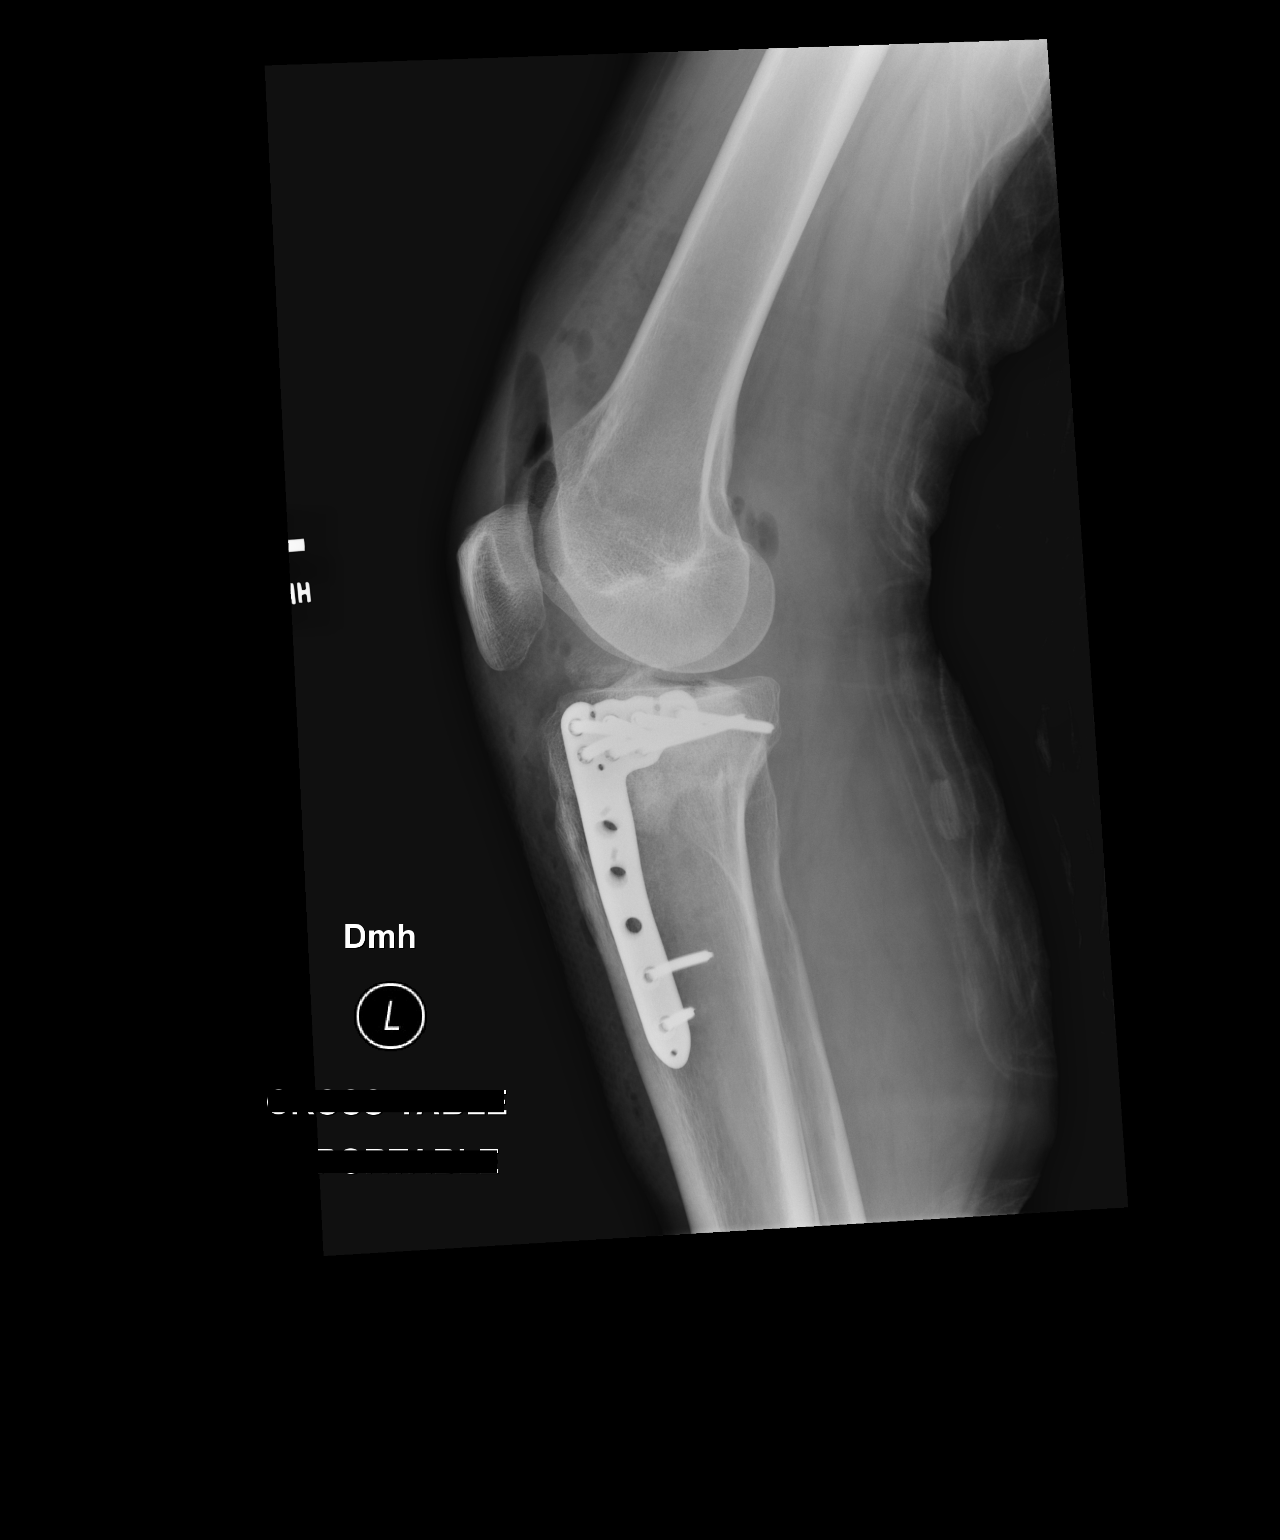

[2 of 2 positions shown; findings below may reference images not displayed]

FINDINGS: Two views of the left knee submitted. Again noted status post
intraoperative repair of lateral proximal tibial fracture. A lateral
metallic fixation plate and metallic fixation screws are noted in
proximal tibia. The alignment is preserved. Persistent postsurgical
changes with periarticular soft tissue air.
IMPRESSION: Again noted status post intraoperative repair of lateral proximal
tibial fracture. A lateral metallic fixation plate and metallic
fixation screws are noted in proximal tibia. The alignment is
preserved. Persistent postsurgical changes with periarticular soft
tissue air.

## 2018-03-11 ENCOUNTER — Encounter (HOSPITAL_COMMUNITY): Payer: Self-pay | Admitting: Emergency Medicine

## 2018-03-11 ENCOUNTER — Emergency Department (HOSPITAL_COMMUNITY)
Admission: EM | Admit: 2018-03-11 | Discharge: 2018-03-12 | Disposition: A | Discharge status: Home / Self Care | Payer: Self-pay | Attending: Emergency Medicine | Admitting: Emergency Medicine

## 2018-03-11 ENCOUNTER — Other Ambulatory Visit: Payer: Self-pay

## 2018-03-11 DIAGNOSIS — Z79899 Other long term (current) drug therapy: Secondary | ICD-10-CM | POA: Insufficient documentation

## 2018-03-11 DIAGNOSIS — N739 Female pelvic inflammatory disease, unspecified: Secondary | ICD-10-CM

## 2018-03-11 DIAGNOSIS — F1410 Cocaine abuse, uncomplicated: Secondary | ICD-10-CM | POA: Insufficient documentation

## 2018-03-11 DIAGNOSIS — Z7982 Long term (current) use of aspirin: Secondary | ICD-10-CM | POA: Insufficient documentation

## 2018-03-11 DIAGNOSIS — F17210 Nicotine dependence, cigarettes, uncomplicated: Secondary | ICD-10-CM | POA: Insufficient documentation

## 2018-03-11 LAB — URINALYSIS, ROUTINE W REFLEX MICROSCOPIC
Bilirubin Urine: NEGATIVE
Glucose, UA: NEGATIVE mg/dL
HGB URINE DIPSTICK: NEGATIVE
Ketones, ur: NEGATIVE mg/dL
LEUKOCYTES UA: NEGATIVE
Nitrite: NEGATIVE
Protein, ur: NEGATIVE mg/dL
SPECIFIC GRAVITY, URINE: 1.026 (ref 1.005–1.030)
pH: 5 (ref 5.0–8.0)

## 2018-03-11 LAB — CBC
HCT: 39.1 % (ref 36.0–46.0)
HEMOGLOBIN: 12.6 g/dL (ref 12.0–15.0)
MCH: 30.3 pg (ref 26.0–34.0)
MCHC: 32.2 g/dL (ref 30.0–36.0)
MCV: 94 fL (ref 78.0–100.0)
Platelets: 375 10*3/uL (ref 150–400)
RBC: 4.16 MIL/uL (ref 3.87–5.11)
RDW: 14.7 % (ref 11.5–15.5)
WBC: 12.7 10*3/uL — AB (ref 4.0–10.5)

## 2018-03-11 LAB — COMPREHENSIVE METABOLIC PANEL
ALK PHOS: 43 U/L (ref 38–126)
ALT: 10 U/L (ref 0–44)
AST: 16 U/L (ref 15–41)
Albumin: 3.8 g/dL (ref 3.5–5.0)
Anion gap: 7 (ref 5–15)
BUN: 5 mg/dL — ABNORMAL LOW (ref 6–20)
CALCIUM: 8.6 mg/dL — AB (ref 8.9–10.3)
CHLORIDE: 107 mmol/L (ref 98–111)
CO2: 26 mmol/L (ref 22–32)
Creatinine, Ser: 0.79 mg/dL (ref 0.44–1.00)
GFR calc non Af Amer: >60 mL/min (ref >60)
Glucose, Bld: 101 mg/dL — ABNORMAL HIGH (ref 70–99)
Potassium: 4.4 mmol/L (ref 3.5–5.1)
SODIUM: 140 mmol/L (ref 135–145)
Total Bilirubin: 0.3 mg/dL (ref 0.3–1.2)
Total Protein: 6.5 g/dL (ref 6.5–8.1)

## 2018-03-11 LAB — LIPASE, BLOOD: LIPASE: 31 U/L (ref 11–51)

## 2018-03-11 LAB — I-STAT BETA HCG BLOOD, ED (MC, WL, AP ONLY)

## 2018-03-11 NOTE — ED Triage Notes (Signed)
Pt reports 10/10 lower abd pain for the last month. Pt denies N/V/D. Pt reports constipation, has taken otc medications without relief.

## 2018-03-11 NOTE — ED Notes (Signed)
[facility] for pt who continues to have 10/10 pain. Triage nurse [place], RN notified

## 2018-03-12 ENCOUNTER — Telehealth: Payer: Self-pay | Admitting: *Deleted

## 2018-03-12 LAB — WET PREP, GENITAL
Sperm: NONE SEEN
Trich, Wet Prep: NONE SEEN
Yeast Wet Prep HPF POC: NONE SEEN

## 2018-03-12 LAB — SYPHILIS: RPR W/REFLEX TO RPR TITER AND TREPONEMAL ANTIBODIES, TRADITIONAL SCREENING AND DIAGNOSIS ALGORITHM: RPR Ser Ql: NONREACTIVE

## 2018-03-12 LAB — HIV ANTIBODY (ROUTINE TESTING W REFLEX): HIV Screen 4th Generation wRfx: NONREACTIVE

## 2018-03-12 MED ORDER — DOXYCYCLINE HYCLATE 100 MG PO TABS
100.0000 mg | ORAL_TABLET | Freq: Once | ORAL | Status: AC
  Administered 2018-03-12: 100 mg via ORAL
  Filled 2018-03-12: qty 1

## 2018-03-12 MED ORDER — METRONIDAZOLE 500 MG PO TABS
500.0000 mg | ORAL_TABLET | Freq: Once | ORAL | Status: AC
  Administered 2018-03-12: 500 mg via ORAL
  Filled 2018-03-12: qty 1

## 2018-03-12 MED ORDER — AZITHROMYCIN 250 MG PO TABS
1000.0000 mg | ORAL_TABLET | Freq: Once | ORAL | Status: AC
  Administered 2018-03-12: 1000 mg via ORAL
  Filled 2018-03-12: qty 4

## 2018-03-12 MED ORDER — METRONIDAZOLE 500 MG PO TABS: 500.0000 mg | ORAL_TABLET | Freq: Two times a day (BID) | ORAL | 0 refills | Status: DC

## 2018-03-12 MED ORDER — DOXYCYCLINE HYCLATE 100 MG PO CAPS: 100.0000 mg | ORAL_CAPSULE | Freq: Two times a day (BID) | ORAL | 0 refills | Status: DC

## 2018-03-12 MED ORDER — CEFTRIAXONE SODIUM 250 MG IJ SOLR
250.0000 mg | Freq: Once | INTRAMUSCULAR | Status: AC
  Administered 2018-03-12: 250 mg via INTRAMUSCULAR
  Filled 2018-03-12: qty 250

## 2018-03-12 MED ORDER — KETOROLAC TROMETHAMINE 60 MG/2ML IM SOLN
60.0000 mg | Freq: Once | INTRAMUSCULAR | Status: AC
  Administered 2018-03-12: 60 mg via INTRAMUSCULAR
  Filled 2018-03-12: qty 2

## 2018-03-12 NOTE — Discharge Instructions (Signed)
Take ibuprofen or naproxen as needed for pain control.  Return if pain is getting worse, you start vomiting, or if you start running a fever.

## 2018-03-12 NOTE — Telephone Encounter (Signed)
Pt called regarding Rx not sent to pharmacy.  EDCM called in Rx to [facility].

## 2018-03-12 NOTE — ED Provider Notes (Signed)
[facility] EMERGENCY DEPARTMENT Provider Note   CSN: [id number] Arrival date & time: 03/11/18  1847     History   Chief Complaint Chief Complaint  Patient presents with  . Abdominal Pain    HPI Ellen Stone is a 32 y.o. female.  The history is provided by the patient.  She has history of vitamin D deficiency, cocaine abuse and comes in because of lower abdominal pain for the last month.  Pain is sharp and crampy and she rates it a 10/10.  Is worse with walking and worse with intercourse.  She denies fever, chills, sweats.  She denies nausea or vomiting.  She has had some intermittent constipation, no diarrhea.  She denies any urinary difficulty.  She denies any vaginal discharge.  She is not currently using any contraception.  She has been taking Midol with slight relief of pain.  Last menses was in the beginning of the month and was normal.  Past Medical History:  Diagnosis Date  . Chronic bronchitis ([place])   . Cocaine abuse ([place])   . Hyperphosphatemia 10/20/2015  . Recurrent UTI (urinary tract infection)    "none lately; since I started drinking more water" (10/18/2015)  . Vitamin D deficiency 10/20/2015    Patient Active Problem List   Diagnosis Date Noted  . Vitamin D deficiency 10/20/2015  . Cocaine abuse ([place]) 10/20/2015  . Hyperphosphatemia 10/20/2015  . Tibial plateau fracture 10/18/2015    Past Surgical History:  Procedure Laterality Date  . FRACTURE SURGERY    . ORIF PROXIMAL TIBIAL PLATEAU FRACTURE Left 10/18/2015  . ORIF TIBIA PLATEAU Left 10/18/2015   Procedure: OPEN REDUCTION INTERNAL FIXATION (ORIF) LEFT TIBIAL PLATEAU;  Surgeon: Altamese Bogard, MD;  Location: [place];  Service: Orthopedics;  Laterality: Left;     OB History   None      Home Medications    Prior to Admission medications   Medication Sig Start Date End Date Taking? Authorizing Provider  aspirin EC 325 MG tablet Take 1 tablet (325 mg total) by mouth daily. 10/20/15   Ainsley Spinner, PA-C  calcium citrate (CALCITRATE - DOSED IN MG ELEMENTAL CALCIUM) 950 MG tablet Take 1 tablet (200 mg of elemental calcium total) by mouth daily. 10/20/15   Ainsley Spinner, PA-C  cholecalciferol 2000 units TABS Take 1 tablet (2,000 Units total) by mouth 2 (two) times daily. 10/20/15   Ainsley Spinner, PA-C  cyclobenzaprine (FLEXERIL) 10 MG tablet Take 1 tablet (10 mg total) by mouth 2 (two) times daily as needed for muscle spasms. 12/22/15   Ashley Murrain, NP  docusate sodium (COLACE) 100 MG capsule Take 1 capsule (100 mg total) by mouth 2 (two) times daily. 10/20/15   Ainsley Spinner, PA-C  naproxen (NAPROSYN) 500 MG tablet Take 1 tablet (500 mg total) by mouth 2 (two) times daily. 12/22/15   Ashley Murrain, NP  vitamin C (VITAMIN C) 500 MG tablet Take 1 tablet (500 mg total) by mouth daily. 10/20/15   Ainsley Spinner, PA-C  Vitamin D, Ergocalciferol, (DRISDOL) 50000 units CAPS capsule Take 1 capsule (50,000 Units total) by mouth every 7 (seven) days. 10/20/15   Ainsley Spinner, PA-C    Family History No family history on file.  Social History Social History   Tobacco Use  . Smoking status: Current Every Day Smoker    Packs/day: 1.00    Years: 10.00    Pack years: 10.00    Types: Cigarettes  . Smokeless tobacco: Never Used  Substance Use Topics  . Alcohol use: Yes    Comment: 10/18/2015 "down to maybe 1 beer q 2 weeks or so"  . Drug use: Yes    Types: Cocaine, Marijuana    Comment: 10/18/2015 "quit cocaine recently"     Allergies   Patient has no known allergies.   Review of Systems Review of Systems  All other systems reviewed and are negative.    Physical Exam Updated Vital Signs BP (!) 135/92 (BP Location: Right Arm)   Pulse 83   Temp 99.3 F (37.4 C) (Oral)   Resp 17   Ht 5\' 11"  (1.803 m)   Wt 73.9 kg   LMP 02/13/2018   SpO2 100%   BMI 22.73 kg/m   Physical Exam  Nursing note and vitals reviewed.  32 year old female, resting comfortably and in no acute distress. Vital signs are  significant for borderline elevated diastolic blood pressure. Oxygen saturation is 100%, which is normal. Head is normocephalic and atraumatic. PERRLA, EOMI. Oropharynx is clear. Neck is nontender and supple without adenopathy or JVD. Back is nontender and there is no CVA tenderness. Lungs are clear without rales, wheezes, or rhonchi. Chest is nontender. Heart has regular rate and rhythm without murmur. Abdomen is soft, flat, with moderate tenderness across the suprapubic area with maximum tenderness in the midline and just to the right of midline.  There is no rebound or guarding.  There are no masses or hepatosplenomegaly and peristalsis is normoactive. Pelvic: Normal external female genitalia.  Cervix is closed.  No vaginal discharge seen.  Plus/minus cervical motion tenderness.  Fundus is normal in size and position but very tender.  Mild to moderate bilateral adnexal tenderness without masses. Extremities have no cyanosis or edema, full range of motion is present. Skin is warm and dry without rash. Neurologic: Mental status is normal, cranial nerves are intact, there are no motor or sensory deficits.  ED Treatments / Results  Labs (all labs ordered are listed, but only abnormal results are displayed) Labs Reviewed  WET PREP, GENITAL - Abnormal; Notable for the following components:      Result Value   Clue Cells Wet Prep HPF POC PRESENT (*)    WBC, Wet Prep HPF POC MANY (*)    All other components within normal limits  COMPREHENSIVE METABOLIC PANEL - Abnormal; Notable for the following components:   Glucose, Bld 101 (*)    BUN 5 (*)    Calcium 8.6 (*)    All other components within normal limits  CBC - Abnormal; Notable for the following components:   WBC 12.7 (*)    All other components within normal limits  LIPASE, BLOOD  URINALYSIS, ROUTINE W REFLEX MICROSCOPIC  RPR  HIV ANTIBODY (ROUTINE TESTING)  I-STAT BETA HCG BLOOD, ED ([facility], [facility], [facility] ONLY)  GC/CHLAMYDIA PROBE AMP ([facility]) NOT AT [facility]   Procedures Procedures  Medications Ordered in ED Medications  ketorolac (TORADOL) injection 60 mg (has no administration in time range)     Initial Impression / Assessment and Plan / ED Course  I have reviewed the triage vital signs and the nursing notes.  Pertinent labs & imaging results that were available during my care of the patient were reviewed by me and considered in my medical decision making (see chart for details).  Lower abdominal pain suspicious for pelvic inflammatory disease.  Old records are reviewed, and she has no relevant past visits.  Abdominal CT scan in 2017 was unremarkable.  She will be given a dose of ketorolac.  She had good relief of pain with ketorolac.  Pelvic exam certainly consistent with pelvic inflammatory disease.  She will be treated with ceftriaxone and azithromycin and a 10-day course of doxycycline, referred to women's clinic for follow-up.  Wet prep is come back with clue cells present.  Will add 10-day course of metronidazole to her regimen.  Return precautions discussed.  Final Clinical Impressions(s) / ED Diagnoses   Final diagnoses:  Pelvic inflammatory disease    ED Discharge Orders         Ordered    doxycycline (VIBRAMYCIN) 100 MG capsule  2 times daily     03/12/18 0342    metroNIDAZOLE (FLAGYL) 500 MG tablet  2 times daily     03/12/18 8889           Delora Fuel, MD 16/94/50 [phone]

## 2018-03-14 LAB — GC/CHLAMYDIA PROBE AMP (~~LOC~~) NOT AT ARMC
Chlamydia: NEGATIVE
Neisseria Gonorrhea: NEGATIVE

## 2019-07-11 ENCOUNTER — Encounter (HOSPITAL_COMMUNITY): Payer: Self-pay | Admitting: Emergency Medicine

## 2019-07-11 ENCOUNTER — Emergency Department (HOSPITAL_COMMUNITY): Payer: Self-pay

## 2019-07-11 ENCOUNTER — Emergency Department (HOSPITAL_COMMUNITY)
Admission: EM | Admit: 2019-07-11 | Discharge: 2019-07-11 | Disposition: A | Discharge status: Home / Self Care | Payer: Self-pay | Attending: Emergency Medicine | Admitting: Emergency Medicine

## 2019-07-11 ENCOUNTER — Other Ambulatory Visit: Payer: Self-pay

## 2019-07-11 DIAGNOSIS — Z79899 Other long term (current) drug therapy: Secondary | ICD-10-CM | POA: Insufficient documentation

## 2019-07-11 DIAGNOSIS — F17210 Nicotine dependence, cigarettes, uncomplicated: Secondary | ICD-10-CM | POA: Insufficient documentation

## 2019-07-11 DIAGNOSIS — K047 Periapical abscess without sinus: Secondary | ICD-10-CM | POA: Insufficient documentation

## 2019-07-11 LAB — CBC WITH DIFFERENTIAL/PLATELET
Abs Immature Granulocytes: 0.05 10*3/uL (ref 0.00–0.07)
Basophils Absolute: 0.1 10*3/uL (ref 0.0–0.1)
Basophils Relative: 0 %
Eosinophils Absolute: 0 10*3/uL (ref 0.0–0.5)
Eosinophils Relative: 0 %
HCT: 40.9 % (ref 36.0–46.0)
Hemoglobin: 13.7 g/dL (ref 12.0–15.0)
Immature Granulocytes: 0 %
Lymphocytes Relative: 12 %
Lymphs Abs: 1.8 10*3/uL (ref 0.7–4.0)
MCH: 30.7 pg (ref 26.0–34.0)
MCHC: 33.5 g/dL (ref 30.0–36.0)
MCV: 91.7 fL (ref 80.0–100.0)
Monocytes Absolute: 1.4 10*3/uL — ABNORMAL HIGH (ref 0.1–1.0)
Monocytes Relative: 9 %
Neutro Abs: 12.1 10*3/uL — ABNORMAL HIGH (ref 1.7–7.7)
Neutrophils Relative %: 79 %
Platelets: 434 10*3/uL — ABNORMAL HIGH (ref 150–400)
RBC: 4.46 MIL/uL (ref 3.87–5.11)
RDW: 15.7 % — ABNORMAL HIGH (ref 11.5–15.5)
WBC: 15.5 10*3/uL — ABNORMAL HIGH (ref 4.0–10.5)
nRBC: 0 % (ref 0.0–0.2)

## 2019-07-11 LAB — BASIC METABOLIC PANEL
Anion gap: 11 (ref 5–15)
BUN: 7 mg/dL (ref 6–20)
CO2: 25 mmol/L (ref 22–32)
Calcium: 9 mg/dL (ref 8.9–10.3)
Chloride: 99 mmol/L (ref 98–111)
Creatinine, Ser: 0.7 mg/dL (ref 0.44–1.00)
GFR calc Af Amer: >60 mL/min (ref >60)
GFR calc non Af Amer: >60 mL/min (ref >60)
Glucose, Bld: 112 mg/dL — ABNORMAL HIGH (ref 70–99)
Potassium: 3.5 mmol/L (ref 3.5–5.1)
Sodium: 135 mmol/L (ref 135–145)

## 2019-07-11 LAB — C-REACTIVE PROTEIN: CRP: 1.1 mg/dL — ABNORMAL HIGH (ref <1.0)

## 2019-07-11 LAB — I-STAT BETA HCG BLOOD, ED (MC, WL, AP ONLY): I-stat hCG, quantitative: <5 m[IU]/mL (ref <5)

## 2019-07-11 LAB — SEDIMENTATION RATE: Sed Rate: 17 mm/hr (ref 0–22)

## 2019-07-11 MED ORDER — HYDROCODONE-ACETAMINOPHEN 5-325 MG PO TABS: 1.0000 | ORAL_TABLET | ORAL | 0 refills | Status: DC | PRN

## 2019-07-11 MED ORDER — CLINDAMYCIN HCL 300 MG PO CAPS: 300.0000 mg | ORAL_CAPSULE | Freq: Four times a day (QID) | ORAL | 0 refills | Status: DC

## 2019-07-11 MED ORDER — SODIUM CHLORIDE 0.9 % IV SOLN
3.0000 g | Freq: Once | INTRAVENOUS | Status: AC
  Administered 2019-07-11: 3 g via INTRAVENOUS
  Filled 2019-07-11: qty 8

## 2019-07-11 MED ORDER — MORPHINE SULFATE (PF) 4 MG/ML IV SOLN
4.0000 mg | Freq: Once | INTRAVENOUS | Status: AC
  Administered 2019-07-11: 4 mg via INTRAVENOUS
  Filled 2019-07-11: qty 1

## 2019-07-11 MED ORDER — SODIUM CHLORIDE 0.9 % IV BOLUS
1000.0000 mL | Freq: Once | INTRAVENOUS | Status: AC
  Administered 2019-07-11: 1000 mL via INTRAVENOUS

## 2019-07-11 MED ORDER — IOHEXOL 300 MG/ML  SOLN
75.0000 mL | Freq: Once | INTRAMUSCULAR | Status: AC | PRN
  Administered 2019-07-11: 75 mL via INTRAVENOUS

## 2019-07-11 NOTE — ED Triage Notes (Addendum)
C/o L lower dental pain with facial swelling since 12/23.  Denies fever.  Reports nausea and headache that started today.

## 2019-07-11 NOTE — Discharge Instructions (Addendum)
Take Clindamycin three times daily for 10 days for infection. Take with food Take Ibuprofen for mild-moderate pain Take Norco for severe pain every 4-6 hours Please follow up with a dentist

## 2019-07-11 NOTE — ED Provider Notes (Signed)
[facility] EMERGENCY DEPARTMENT Provider Note   CSN: [id number] Arrival date & time: 07/11/19  4132     History Chief Complaint  Patient presents with  . Dental Pain  . Facial Swelling    Ellen Stone is a 33 y.o. female with history of cocaine abuse, tobacco abuse, dental caries who presents with facial swelling.  Patient states that she initially started having left lower dental pain 3 to 4 days ago.  She has been using over-the-counter medicines without significant relief.  Yesterday she started to have increasing pain and this morning she woke up with swelling of her left lower jaw and this prompted her to come to the ED.  She not had a fever but reports nausea and headache.  She hasn't been able to eat or drink and feels dehydrated. States that she has had these symptoms before and thinks it may be due to a root canal that was not done correctly. She has history of cocaine abuse but states she has been sober for 2 months  HPI     Past Medical History:  Diagnosis Date  . Chronic bronchitis ([place])   . Cocaine abuse ([place])   . Hyperphosphatemia 10/20/2015  . Recurrent UTI (urinary tract infection)    "none lately; since I started drinking more water" (10/18/2015)  . Vitamin D deficiency 10/20/2015    Patient Active Problem List   Diagnosis Date Noted  . Vitamin D deficiency 10/20/2015  . Cocaine abuse ([place]) 10/20/2015  . Hyperphosphatemia 10/20/2015  . Tibial plateau fracture 10/18/2015    Past Surgical History:  Procedure Laterality Date  . FRACTURE SURGERY    . ORIF PROXIMAL TIBIAL PLATEAU FRACTURE Left 10/18/2015  . ORIF TIBIA PLATEAU Left 10/18/2015   Procedure: OPEN REDUCTION INTERNAL FIXATION (ORIF) LEFT TIBIAL PLATEAU;  Surgeon: Altamese Gary, MD;  Location: [facility];  Service: Orthopedics;  Laterality: Left;     OB History   No obstetric history on file.     No family history on file.  Social History   Tobacco Use  . Smoking status: Current  Every Day Smoker    Packs/day: 1.00    Years: 10.00    Pack years: 10.00    Types: Cigarettes  . Smokeless tobacco: Never Used  Substance Use Topics  . Alcohol use: Yes    Comment: 10/18/2015 "down to maybe 1 beer q 2 weeks or so"  . Drug use: Yes    Types: Cocaine, Marijuana    Comment: 10/18/2015 "quit cocaine recently"    Home Medications Prior to Admission medications   Medication Sig Start Date End Date Taking? Authorizing Provider  Acetaminophen (MIDOL PO) Take 5 tablets by mouth 5 (five) times daily as needed (cramps).   Yes [provider]    Allergies    Patient has no known allergies.  Review of Systems   Review of Systems  Constitutional: Negative for fever.  HENT: Positive for dental problem and facial swelling.   Gastrointestinal: Positive for nausea. Negative for vomiting.  Neurological: Positive for headaches.  All other systems reviewed and are negative.   Physical Exam Updated Vital Signs BP (!) 136/91 (BP Location: Left Arm)   Pulse (!) 127   Temp 98.1 F (36.7 C) (Oral)   Resp 18   LMP 07/06/2019   SpO2 98%   Physical Exam Vitals and nursing note reviewed.  Constitutional:      General: She is not in acute distress.    Appearance: Normal  appearance. She is well-developed. She is not ill-appearing.  HENT:     Head: Normocephalic and atraumatic.     Mouth/Throat:     Lips: Pink.     Mouth: Mucous membranes are moist.     Dentition: Abnormal dentition. Dental tenderness, gingival swelling and dental caries present. No dental abscesses.     Pharynx: Oropharynx is clear.     Comments: Swelling over the area over the left lower gumline. Molar is absent. No obvious drainable abscess however she does have focal area of swelling over the left lower jawline. Eyes:     General: No scleral icterus.       Right eye: No discharge.        Left eye: No discharge.     Conjunctiva/sclera: Conjunctivae normal.     Pupils: Pupils are equal, round, and  reactive to light.  Cardiovascular:     Rate and Rhythm: Normal rate.  Pulmonary:     Effort: Pulmonary effort is normal. No respiratory distress.  Abdominal:     General: There is no distension.  Musculoskeletal:     Cervical back: Normal range of motion.  Skin:    General: Skin is warm and dry.  Neurological:     Mental Status: She is alert and oriented to person, place, and time.  Psychiatric:        Behavior: Behavior normal.     ED Results / Procedures / Treatments   Labs (all labs ordered are listed, but only abnormal results are displayed) Labs Reviewed  BASIC METABOLIC PANEL - Abnormal; Notable for the following components:      Result Value   Glucose, Bld 112 (*)    All other components within normal limits  CBC WITH DIFFERENTIAL/PLATELET - Abnormal; Notable for the following components:   WBC 15.5 (*)    RDW 15.7 (*)    Platelets 434 (*)    Neutro Abs 12.1 (*)    Monocytes Absolute 1.4 (*)    All other components within normal limits  C-REACTIVE PROTEIN - Abnormal; Notable for the following components:   CRP 1.1 (*)    All other components within normal limits  SEDIMENTATION RATE  I-STAT BETA HCG BLOOD, ED ([facility], [facility], [facility] ONLY)    EKG None  Radiology CT Maxillofacial W Contrast  Result Date: 07/11/2019 CLINICAL DATA:  Left lower dental pain and fever. Facial swelling for three days. EXAM: CT MAXILLOFACIAL WITH CONTRAST TECHNIQUE: Multidetector CT imaging of the maxillofacial structures was performed with intravenous contrast. Multiplanar CT image reconstructions were also generated. CONTRAST:  32m OMNIPAQUE IOHEXOL 300 MG/ML  SOLN COMPARISON:  None. FINDINGS: Osseous: Prominent dental caries is present within the residual left maxillary molar tooth. There is significant periapical lucency seen about the roots of this tooth without definite cortical perforation of mandible. There is some sclerosis in the marrow space surround the roots of this tooth. Other smaller  dental caries are present without significant periodontal disease. Acute or healing fractures are present. Facial bones are otherwise unremarkable. Orbits: The globes and orbits are within normal limits. Sinuses: The paranasal sinuses and mastoid air cells are clear. Soft tissues: Inflammatory changes are present along the a lateral aspect of the maxilla. These are centered at the angle the maxilla, the location of the root of the infected tooth. No discrete abscess is present. Intrinsic tongue muscles are within normal limits. There is some hypoattenuation within the inferior aspect of the masseter muscle. Limited intracranial: Visualized intracranial contents are within  normal limits. IMPRESSION: 1. Prominent dental caries and periapical lucency about the roots of the residual left maxillary molar tooth. 2. No discrete abscess. 3. Inflammatory changes along the lateral aspect of the maxilla, centered at the location of the root of the infected tooth. 4. Other smaller dental caries are present without significant periodontal disease. 5. Mild hypoattenuation within the inferior aspect of the left masseter muscle likely represents reactive edema. No discrete abscess is present. Electronically Signed   By: San Morelle M.D.   On: 07/11/2019 14:54    Procedures Procedures (including critical care time)  Medications Ordered in ED Medications  sodium chloride 0.9 % bolus 1,000 mL (1,000 mLs Intravenous New Bag/Given 07/11/19 1322)  Ampicillin-Sulbactam (UNASYN) 3 g in sodium chloride 0.9 % 100 mL IVPB (0 g Intravenous Stopped 07/11/19 1408)  morphine 4 MG/ML injection 4 mg (4 mg Intravenous Given 07/11/19 1323)  iohexol (OMNIPAQUE) 300 MG/ML solution 75 mL (75 mLs Intravenous Contrast Given 07/11/19 1414)    ED Course  I have reviewed the triage vital signs and the nursing notes.  Pertinent labs & imaging results that were available during my care of the patient were reviewed by me and considered  in my medical decision making (see chart for details).  33 year old female presents with left lower dental pain and jaw swelling for several days. BP is elevated and she is tachycardic in the 120s. There is no obvious drainable abscess. Will obtain labs and give fluids, pain meds, abx, and CT max/face  CBC shows leukocytosis of 15. BMP is normal. CRP is minimally elevated and ESR is normal. CT is still pending.  2:57 PM Heard patient screaming and went to recheck her - she is screaming and crying stating pain is worse. HR is 140s. Dental block given - she got immediate relief from this.  CT shows periapical lucency with multiple dental caries without discrete abscess. Rechecked pt. HR is improved after fluids and pain control. Will give rx for Clindamycin, pain medicine, and dental resource guide.   MDM Rules/Calculators/A&P                       Final Clinical Impression(s) / ED Diagnoses Final diagnoses:  Dental infection    Rx / DC Orders ED Discharge Orders    None       Recardo Evangelist, PA-C 07/11/19 1558    Lucrezia Starch, MD 07/12/19 1438

## 2019-08-18 ENCOUNTER — Emergency Department (HOSPITAL_COMMUNITY): Payer: Self-pay

## 2019-08-18 ENCOUNTER — Emergency Department (HOSPITAL_COMMUNITY)
Admission: EM | Admit: 2019-08-18 | Discharge: 2019-08-19 | Disposition: A | Discharge status: Home / Self Care | Payer: Self-pay | Attending: Emergency Medicine | Admitting: Emergency Medicine

## 2019-08-18 DIAGNOSIS — R1030 Lower abdominal pain, unspecified: Secondary | ICD-10-CM

## 2019-08-18 DIAGNOSIS — R110 Nausea: Secondary | ICD-10-CM | POA: Insufficient documentation

## 2019-08-18 DIAGNOSIS — F17210 Nicotine dependence, cigarettes, uncomplicated: Secondary | ICD-10-CM | POA: Insufficient documentation

## 2019-08-18 DIAGNOSIS — R911 Solitary pulmonary nodule: Secondary | ICD-10-CM

## 2019-08-18 DIAGNOSIS — N858 Other specified noninflammatory disorders of uterus: Secondary | ICD-10-CM | POA: Insufficient documentation

## 2019-08-18 DIAGNOSIS — R918 Other nonspecific abnormal finding of lung field: Secondary | ICD-10-CM | POA: Insufficient documentation

## 2019-08-18 LAB — WET PREP, GENITAL
Clue Cells Wet Prep HPF POC: NONE SEEN
Sperm: NONE SEEN
Trich, Wet Prep: NONE SEEN
Yeast Wet Prep HPF POC: NONE SEEN

## 2019-08-18 LAB — COMPREHENSIVE METABOLIC PANEL
ALT: 21 U/L (ref 0–44)
AST: 29 U/L (ref 15–41)
Albumin: 4.8 g/dL (ref 3.5–5.0)
Alkaline Phosphatase: 64 U/L (ref 38–126)
Anion gap: 18 — ABNORMAL HIGH (ref 5–15)
BUN: 9 mg/dL (ref 6–20)
CO2: 20 mmol/L — ABNORMAL LOW (ref 22–32)
Calcium: 9.5 mg/dL (ref 8.9–10.3)
Chloride: 99 mmol/L (ref 98–111)
Creatinine, Ser: 1.12 mg/dL — ABNORMAL HIGH (ref 0.44–1.00)
GFR calc Af Amer: >60 mL/min (ref >60)
GFR calc non Af Amer: >60 mL/min (ref >60)
Glucose, Bld: 116 mg/dL — ABNORMAL HIGH (ref 70–99)
Potassium: 3.2 mmol/L — ABNORMAL LOW (ref 3.5–5.1)
Sodium: 137 mmol/L (ref 135–145)
Total Bilirubin: 0.4 mg/dL (ref 0.3–1.2)
Total Protein: 8.8 g/dL — ABNORMAL HIGH (ref 6.5–8.1)

## 2019-08-18 LAB — CBC
HCT: 40.8 % (ref 36.0–46.0)
Hemoglobin: 13.5 g/dL (ref 12.0–15.0)
MCH: 29.9 pg (ref 26.0–34.0)
MCHC: 33.1 g/dL (ref 30.0–36.0)
MCV: 90.5 fL (ref 80.0–100.0)
Platelets: 842 10*3/uL — ABNORMAL HIGH (ref 150–400)
RBC: 4.51 MIL/uL (ref 3.87–5.11)
RDW: 14 % (ref 11.5–15.5)
WBC: 12.5 10*3/uL — ABNORMAL HIGH (ref 4.0–10.5)
nRBC: 0 % (ref 0.0–0.2)

## 2019-08-18 LAB — URINALYSIS, ROUTINE W REFLEX MICROSCOPIC
Bilirubin Urine: NEGATIVE
Glucose, UA: NEGATIVE mg/dL
Ketones, ur: 20 mg/dL — AB
Leukocytes,Ua: NEGATIVE
Nitrite: NEGATIVE
Protein, ur: 100 mg/dL — AB
Specific Gravity, Urine: 1.041 — ABNORMAL HIGH (ref 1.005–1.030)
pH: 5 (ref 5.0–8.0)

## 2019-08-18 LAB — I-STAT BETA HCG BLOOD, ED (MC, WL, AP ONLY): I-stat hCG, quantitative: <5 m[IU]/mL (ref <5)

## 2019-08-18 LAB — LIPASE, BLOOD: Lipase: 25 U/L (ref 11–51)

## 2019-08-18 MED ORDER — IOHEXOL 300 MG/ML  SOLN
100.0000 mL | Freq: Once | INTRAMUSCULAR | Status: AC | PRN
  Administered 2019-08-18: 100 mL via INTRAVENOUS

## 2019-08-18 MED ORDER — ONDANSETRON HCL 4 MG/2ML IJ SOLN
4.0000 mg | Freq: Once | INTRAMUSCULAR | Status: AC
  Administered 2019-08-18: 4 mg via INTRAVENOUS
  Filled 2019-08-18: qty 2

## 2019-08-18 MED ORDER — SODIUM CHLORIDE 0.9 % IV BOLUS
1000.0000 mL | Freq: Once | INTRAVENOUS | Status: AC
  Administered 2019-08-18: 1000 mL via INTRAVENOUS

## 2019-08-18 MED ORDER — HYDROCODONE-ACETAMINOPHEN 5-325 MG PO TABS: 1.0000 | ORAL_TABLET | Freq: Four times a day (QID) | ORAL | 0 refills | Status: DC | PRN

## 2019-08-18 MED ORDER — MORPHINE SULFATE (PF) 4 MG/ML IV SOLN
4.0000 mg | Freq: Once | INTRAVENOUS | Status: AC
  Administered 2019-08-18: 4 mg via INTRAVENOUS
  Filled 2019-08-18: qty 1

## 2019-08-18 MED ORDER — SODIUM CHLORIDE 0.9% FLUSH: 3.0000 mL | Freq: Once | INTRAVENOUS | Status: DC

## 2019-08-18 NOTE — Discharge Instructions (Signed)
You can take Tylenol or Ibuprofen as directed for pain. You can alternate Tylenol and Ibuprofen every 4 hours. If you take Tylenol at 1pm, then you can take Ibuprofen at 5pm. Then you can take Tylenol again at 9pm.   Take pain medications as directed for break through pain. Do not drive or operate machinery while taking this medication.   As we discussed, you will need to follow-up with OB/GYN for further evaluation of her findings on her ultrasound today.  Her ultrasound showed a 4.1 cm mass in the uterus that they think is a uterine fibroid.  This requires further follow-up by OB/GYN.  Additionally, your CT scan showed pulmonary nodules.  These are often benign but need to be followed by primary care doctor.  He will probably need repeat imaging in about a year.  Return the emergency department for any worsening pain, fevers, vomiting or any other worsening or concerning symptoms.

## 2019-08-18 NOTE — ED Triage Notes (Signed)
Pt having extreme lower abd pain was having menstrual bleeding but stopped 2 days ago and abd pain has gotten worse. Pt is 10/10 pain unable to sit still in chair.

## 2019-08-18 NOTE — ED Notes (Signed)
Pelvic Cart at bedside 

## 2019-08-18 NOTE — ED Provider Notes (Signed)
[facility] EMERGENCY DEPARTMENT Provider Note   CSN: [id number] Arrival date & time: 08/18/19  1728     History Chief Complaint  Patient presents with  . Abdominal Pain    Ellen Stone is a 34 y.o. female possible history of cocaine abuse, recurrent UTI who presents for evaluation of 4 to 5 days of lower abdominal pain that has progressively gotten worse.  She states it is been a constant pain but it has varied in intensity.  She describes it as a "pulling, tightness" sensation.  She states she thought it was related to her recent period and so she was taking Midol with no improvement.  She reports her LMP was between 1/23 and 1/29.  She states she has continued to have some mild vaginal spotting but no vaginal discharge.  She states she has had nausea and decreased appetite.  She has not been able to eat much over the last 4 to 5 days secondary to her symptoms.  She has not noted any fevers, vomiting, diarrhea.  She is currently sexually active and does not use protection.  She has not noted any vaginal discharge.  She denies any chest pain, difficulty breathing, dysuria, hematuria.  The history is provided by the patient.       Past Medical History:  Diagnosis Date  . Chronic bronchitis ([place])   . Cocaine abuse ([place])   . Hyperphosphatemia 10/20/2015  . Recurrent UTI (urinary tract infection)    "none lately; since I started drinking more water" (10/18/2015)  . Vitamin D deficiency 10/20/2015    Patient Active Problem List   Diagnosis Date Noted  . Vitamin D deficiency 10/20/2015  . Cocaine abuse ([place]) 10/20/2015  . Hyperphosphatemia 10/20/2015  . Tibial plateau fracture 10/18/2015    Past Surgical History:  Procedure Laterality Date  . FRACTURE SURGERY    . ORIF PROXIMAL TIBIAL PLATEAU FRACTURE Left 10/18/2015  . ORIF TIBIA PLATEAU Left 10/18/2015   Procedure: OPEN REDUCTION INTERNAL FIXATION (ORIF) LEFT TIBIAL PLATEAU;  Surgeon: Altamese Bergen, MD;  Location: [facility];  Service: Orthopedics;  Laterality: Left;     OB History   No obstetric history on file.     No family history on file.  Social History   Tobacco Use  . Smoking status: Current Every Day Smoker    Packs/day: 1.00    Years: 10.00    Pack years: 10.00    Types: Cigarettes  . Smokeless tobacco: Never Used  Substance Use Topics  . Alcohol use: Yes    Comment: 10/18/2015 "down to maybe 1 beer q 2 weeks or so"  . Drug use: Yes    Types: Cocaine, Marijuana    Comment: 10/18/2015 "quit cocaine recently"    Home Medications Prior to Admission medications   Medication Sig Start Date End Date Taking? Authorizing Provider  Acetaminophen (MIDOL PO) Take 5 tablets by mouth 5 (five) times daily as needed (cramps).   Yes [provider]  HYDROcodone-acetaminophen (NORCO/VICODIN) 5-325 MG tablet Take 1-2 tablets by mouth every 6 (six) hours as needed. 08/18/19   Volanda Napoleon, PA-C    Allergies    Patient has no known allergies.  Review of Systems   Review of Systems  Constitutional: Positive for appetite change. Negative for fever.  Respiratory: Negative for cough and shortness of breath.   Cardiovascular: Negative for chest pain.  Gastrointestinal: Positive for abdominal pain and nausea. Negative for vomiting.  Genitourinary: Positive for vaginal bleeding. Negative  for dysuria, hematuria and vaginal discharge.  Neurological: Negative for headaches.  All other systems reviewed and are negative.   Physical Exam Updated Vital Signs BP 130/85 (BP Location: Right Arm)   Pulse 84   Temp 98.1 F (36.7 C) (Oral)   Resp 16   SpO2 95%   Physical Exam Vitals and nursing note reviewed. Exam conducted with a chaperone present.  Constitutional:      Appearance: Normal appearance. She is well-developed.     Comments: Appears uncomfortable.  HENT:     Head: Normocephalic and atraumatic.  Eyes:     General: Lids are normal.     Conjunctiva/sclera: Conjunctivae normal.      Pupils: Pupils are equal, round, and reactive to light.  Cardiovascular:     Rate and Rhythm: Normal rate and regular rhythm.     Pulses: Normal pulses.     Heart sounds: Normal heart sounds. No murmur. No friction rub. No gallop.   Pulmonary:     Effort: Pulmonary effort is normal.     Breath sounds: Normal breath sounds.     Comments: Lungs clear to auscultation bilaterally.  Symmetric chest rise.  No wheezing, rales, rhonchi. Abdominal:     Palpations: Abdomen is soft. Abdomen is not rigid.     Tenderness: There is abdominal tenderness in the right lower quadrant, suprapubic area and left lower quadrant. There is no right CVA tenderness, left CVA tenderness or guarding. Positive signs include McBurney's sign.     Comments: Tenderness palpation noted to lower abdomen, particularly at McBurney's point.  She does have some voluntary guarding noted.  No rigidity.  No CVA tenderness noted bilaterally.  Genitourinary:    Comments: The exam was performed with a chaperone present. Normal external female genitalia. No lesions, rash, or sores.  Small amount of blood noted in vaginal vault and coming from cervical os.  No discharge noted.  No CMT.  No adnexal mass or tenderness noted bilaterally. Musculoskeletal:        General: Normal range of motion.     Cervical back: Full passive range of motion without pain.  Skin:    General: Skin is warm and dry.     Capillary Refill: Capillary refill takes less than 2 seconds.  Neurological:     Mental Status: She is alert and oriented to person, place, and time.  Psychiatric:        Speech: Speech normal.     ED Results / Procedures / Treatments   Labs (all labs ordered are listed, but only abnormal results are displayed) Labs Reviewed  WET PREP, GENITAL - Abnormal; Notable for the following components:      Result Value   WBC, Wet Prep HPF POC MODERATE (*)    All other components within normal limits  COMPREHENSIVE METABOLIC PANEL - Abnormal;  Notable for the following components:   Potassium 3.2 (*)    CO2 20 (*)    Glucose, Bld 116 (*)    Creatinine, Ser 1.12 (*)    Total Protein 8.8 (*)    Anion gap 18 (*)    All other components within normal limits  CBC - Abnormal; Notable for the following components:   WBC 12.5 (*)    Platelets 842 (*)    All other components within normal limits  URINALYSIS, ROUTINE W REFLEX MICROSCOPIC - Abnormal; Notable for the following components:   APPearance HAZY (*)    Specific Gravity, Urine 1.041 (*)    Hgb urine  dipstick SMALL (*)    Ketones, ur 20 (*)    Protein, ur 100 (*)    Bacteria, UA RARE (*)    All other components within normal limits  URINE CULTURE  LIPASE, BLOOD  I-STAT BETA HCG BLOOD, ED ([facility], [facility], [facility] ONLY)  GC/CHLAMYDIA PROBE AMP ([place] [facility] [place]) NOT AT [facility]    EKG None  Radiology US Transvaginal Non-OB  Result Date: 08/18/2019 CLINICAL DATA:  Pelvic pain. EXAM: TRANSABDOMINAL AND TRANSVAGINAL ULTRASOUND OF PELVIS DOPPLER ULTRASOUND OF OVARIES TECHNIQUE: Both transabdominal and transvaginal ultrasound examinations of the pelvis were performed. Transabdominal technique was performed for global imaging of the pelvis including uterus, ovaries, adnexal regions, and pelvic cul-de-sac. It was necessary to proceed with endovaginal exam following the transabdominal exam to visualize the endometrium and ovaries. Color and duplex Doppler ultrasound was utilized to evaluate blood flow to the ovaries. COMPARISON:  CT from the same day. FINDINGS: Uterus Measurements: 9.1 x 5.6 x 6.2 cm = volume: 165 mL. There is suggestion of a 4.1 x 3.6 x 3.2 cm mass in the uterine fundus. This mass is heterogeneous. There is no definite hypervascularity. This may be centered within the endometrial canal. There is possible pseudo shadowing. Endometrium Thickness: Cannot be evaluated. Right ovary Measurements: 1.9 x 1.4 x 1.6 cm = volume: 2.1 mL. Normal appearance/no adnexal mass. Left ovary Measurements: 2.8 x  1.8 x 3.6 cm = volume: 9.5 mL. There is a septated cyst measuring approximately 2.4 cm. Pulsed Doppler evaluation of both ovaries demonstrates normal low-resistance arterial and venous waveforms. Other findings There is a trace amount of free fluid in the patient's pelvis. There may be debris within the urinary bladder. IMPRESSION: 1. No evidence for ovarian torsion. 2. There is a heterogeneous 4.1 cm mass in the uterine fundus. The imaging characteristics favor a submucosal fibroid or polyp. However, in the setting of pelvic pain and leukocytosis follow-up with a nonemergent contrast enhanced pelvic MRI is recommended. 3. Trace amount of pelvic free fluid. 4. Probable debris within the urinary bladder. Correlation with urinalysis is recommended. Electronically Signed   By: Constance Holster M.D.   On: 08/18/2019 22:58   US Pelvis Complete  Result Date: 08/18/2019 CLINICAL DATA:  Pelvic pain. EXAM: TRANSABDOMINAL AND TRANSVAGINAL ULTRASOUND OF PELVIS DOPPLER ULTRASOUND OF OVARIES TECHNIQUE: Both transabdominal and transvaginal ultrasound examinations of the pelvis were performed. Transabdominal technique was performed for global imaging of the pelvis including uterus, ovaries, adnexal regions, and pelvic cul-de-sac. It was necessary to proceed with endovaginal exam following the transabdominal exam to visualize the endometrium and ovaries. Color and duplex Doppler ultrasound was utilized to evaluate blood flow to the ovaries. COMPARISON:  CT from the same day. FINDINGS: Uterus Measurements: 9.1 x 5.6 x 6.2 cm = volume: 165 mL. There is suggestion of a 4.1 x 3.6 x 3.2 cm mass in the uterine fundus. This mass is heterogeneous. There is no definite hypervascularity. This may be centered within the endometrial canal. There is possible pseudo shadowing. Endometrium Thickness: Cannot be evaluated. Right ovary Measurements: 1.9 x 1.4 x 1.6 cm = volume: 2.1 mL. Normal appearance/no adnexal mass. Left ovary Measurements:  2.8 x 1.8 x 3.6 cm = volume: 9.5 mL. There is a septated cyst measuring approximately 2.4 cm. Pulsed Doppler evaluation of both ovaries demonstrates normal low-resistance arterial and venous waveforms. Other findings There is a trace amount of free fluid in the patient's pelvis. There may be debris within the urinary bladder. IMPRESSION: 1. No evidence for ovarian torsion.  2. There is a heterogeneous 4.1 cm mass in the uterine fundus. The imaging characteristics favor a submucosal fibroid or polyp. However, in the setting of pelvic pain and leukocytosis follow-up with a nonemergent contrast enhanced pelvic MRI is recommended. 3. Trace amount of pelvic free fluid. 4. Probable debris within the urinary bladder. Correlation with urinalysis is recommended. Electronically Signed   By: Constance Holster M.D.   On: 08/18/2019 22:58   CT ABDOMEN PELVIS W CONTRAST  Result Date: 08/18/2019 CLINICAL DATA:  Abdominal pain. EXAM: CT ABDOMEN AND PELVIS WITH CONTRAST TECHNIQUE: Multidetector CT imaging of the abdomen and pelvis was performed using the standard protocol following bolus administration of intravenous contrast. CONTRAST:  187mL OMNIPAQUE IOHEXOL 300 MG/ML  SOLN COMPARISON:  CT dated December 22, 2015 FINDINGS: Lower chest: There are a few subpleural nodules along the peripheral left lower lobe measuring up to approximately 5 mm (axial series 5, image 13).The heart size is normal. Hepatobiliary: The liver is normal. The gallbladder is distended without CT evidence for acute cholecystitis.There is no biliary ductal dilation. Pancreas: Normal contours without ductal dilatation. No peripancreatic fluid collection. Spleen: No splenic laceration or hematoma. Adrenals/Urinary Tract: --Adrenal glands: No adrenal hemorrhage. --Right kidney/ureter: No hydronephrosis or perinephric hematoma. --Left kidney/ureter: No hydronephrosis or perinephric hematoma. --Urinary bladder: The urinary bladder is decompressed which limits  evaluation. Stomach/Bowel: --Stomach/Duodenum: No hiatal hernia or other gastric abnormality. Normal duodenal course and caliber. --Small bowel: No dilatation or inflammation. --Colon: No focal abnormality. --Appendix: Normal. Vascular/Lymphatic: Normal course and caliber of the major abdominal vessels. --No retroperitoneal lymphadenopathy. --No mesenteric lymphadenopathy. --No pelvic or inguinal lymphadenopathy. Reproductive: There is a heterogeneous appearance of the uterine fundus. The endometrial [place] is somewhat indistinct. There is a hypoattenuating 3.3 cm area at the level of the uterine fundus. There appears to be a small amount of free fluid in the patient's cervix. There is a small amount of free fluid in the patient's pelvis. There is a 2.6 cm left ovarian cystic structure. There may be some infiltration of the pelvic fat. Other: No significant ascites. No free air. The abdominal wall is normal. Musculoskeletal. No acute displaced fractures. IMPRESSION: 1. Heterogeneous irregular appearance of the uterus is nonspecific and not well characterized on this exam. Differential considerations include endometritis or pelvic inflammatory disease. Follow-up with pelvic ultrasound is recommended. 2. Small amount of free fluid in the patient's pelvis. 3. 2.6 cm left ovarian cyst. 4. Peripheral 5 mm pulmonary nodules in the left lower lobe. No follow-up needed if patient is low-risk (and has no known or suspected primary neoplasm). Non-contrast chest CT can be considered in 12 months if patient is high-risk. This recommendation follows the consensus statement: Guidelines for Management of Incidental Pulmonary Nodules Detected on CT Images: From the Fleischner Society 2017; Radiology 2017; 284:228-243. Electronically Signed   By: Constance Holster M.D.   On: 08/18/2019 21:00   [place] Art/Ven Flow Abd Pelv Doppler  Result Date: 08/18/2019 CLINICAL DATA:  Pelvic pain. EXAM: TRANSABDOMINAL AND TRANSVAGINAL ULTRASOUND OF  PELVIS DOPPLER ULTRASOUND OF OVARIES TECHNIQUE: Both transabdominal and transvaginal ultrasound examinations of the pelvis were performed. Transabdominal technique was performed for global imaging of the pelvis including uterus, ovaries, adnexal regions, and pelvic cul-de-sac. It was necessary to proceed with endovaginal exam following the transabdominal exam to visualize the endometrium and ovaries. Color and duplex Doppler ultrasound was utilized to evaluate blood flow to the ovaries. COMPARISON:  CT from the same day. FINDINGS: Uterus Measurements: 9.1 x 5.6 x 6.2  cm = volume: 165 mL. There is suggestion of a 4.1 x 3.6 x 3.2 cm mass in the uterine fundus. This mass is heterogeneous. There is no definite hypervascularity. This may be centered within the endometrial canal. There is possible pseudo shadowing. Endometrium Thickness: Cannot be evaluated. Right ovary Measurements: 1.9 x 1.4 x 1.6 cm = volume: 2.1 mL. Normal appearance/no adnexal mass. Left ovary Measurements: 2.8 x 1.8 x 3.6 cm = volume: 9.5 mL. There is a septated cyst measuring approximately 2.4 cm. Pulsed Doppler evaluation of both ovaries demonstrates normal low-resistance arterial and venous waveforms. Other findings There is a trace amount of free fluid in the patient's pelvis. There may be debris within the urinary bladder. IMPRESSION: 1. No evidence for ovarian torsion. 2. There is a heterogeneous 4.1 cm mass in the uterine fundus. The imaging characteristics favor a submucosal fibroid or polyp. However, in the setting of pelvic pain and leukocytosis follow-up with a nonemergent contrast enhanced pelvic MRI is recommended. 3. Trace amount of pelvic free fluid. 4. Probable debris within the urinary bladder. Correlation with urinalysis is recommended. Electronically Signed   By: Constance Holster M.D.   On: 08/18/2019 22:58    Procedures Procedures (including critical care time)  Medications Ordered in ED Medications  ondansetron  (ZOFRAN) injection 4 mg (4 mg Intravenous Given 08/18/19 1844)  sodium chloride 0.9 % bolus 1,000 mL (0 mLs Intravenous Stopped 08/19/19 0020)  morphine 4 MG/ML injection 4 mg (4 mg Intravenous Given 08/18/19 1844)  iohexol (OMNIPAQUE) 300 MG/ML solution 100 mL (100 mLs Intravenous Contrast Given 08/18/19 2026)  morphine 4 MG/ML injection 4 mg (4 mg Intravenous Given 08/18/19 2303)  ondansetron (ZOFRAN) injection 4 mg (4 mg Intravenous Given 08/18/19 2303)    ED Course  I have reviewed the triage vital signs and the nursing notes.  Pertinent labs & imaging results that were available during my care of the patient were reviewed by me and considered in my medical decision making (see chart for details).  Clinical Course as of Aug 18 2152  Tue Aug 18, 2019  2325 Bacteria, UA(!): RARE [LL]    Clinical Course User Index [LL] Desma Mcgregor   MDM Rules/Calculators/A&P                      34 year old female who presents for evaluation of lower abdominal pain x4 to 5 days.  Associated with nausea, decreased appetite.  No fevers, vomiting.  LMP was was 1/23-1/29.  Initially arrival but she is afebrile, but appears uncomfortable.  She is slightly tachycardic but vitals otherwise stable.  Tachycardia likely secondary to pain.  On exam, she has tenderness palpation of the lower abdomen.  Consider appendicitis versus ectopic pregnancy versus other infectious etiology.  We will plan to check labs.  CBC shows leukocytosis of 12.5.  CMP shows BUN of 9, creatinine 1.12. UA shows ketones, pyuria.  There is squamous epithelium so question contaminant.  She is not symptomatic at this time.  We will plan for culture.  I-STAT beta negative.  CT scan shows normal appendix. Dimension heterogenous appearance of the uterine fundus as well as a hypoattenuating 3 x 3 cm area at the level of the uterine fundus. There appears to be a small amount of free fluid in the patient cervix. Also small amount of free fluid in the  patient's pelvis. There is a 2.6 cm left ovarian cystic structure. Findings could be consistent with endometritis or pelvic inflammatory disease.  Patient also has mention of pulmonary nodules in the left lower lobe.  Pelvic exam as documented above.  No CMT that would be concerning for PID.  Ultrasound shows a septated cyst measuring approximate 2.4 cm.  No evidence of torsion.  There is a heterogenous 4.1 cm mass in the uterine fundus that could favor a submucosal fibroid or polyp.   Discussed patient with Dr. Nehemiah Settle (OB/GYN).  Agrees with plan for outpatient follow-up.  Wet prep is unremarkable.  Discussed results with patient.  Patient was tearful and states that she has this pain every month after her period.  I discussed with her that this could be uterine fibroid that is causing her symptoms.  Additionally, discussed with her regarding the pulmonary nodules finding on CT scan.  Patient reviewed on PMP.  No recent narcotic prescription.  Will give a short course of pain medication to help with acute pain until she is able to be seen by hand. At this time, patient exhibits no emergent life-threatening condition that require further evaluation in ED or admission.  Patient had ample opportunity for questions and discussion. All patient's questions were answered with full understanding. Strict return precautions discussed. Patient expresses understanding and agreement to plan.   Portions of this note were generated with [employer]. Dictation errors may occur despite best attempts at proofreading.   Final Clinical Impression(s) / ED Diagnoses Final diagnoses:  Pulmonary nodule  Lower abdominal pain  Uterine mass    Rx / DC Orders ED Discharge Orders         Ordered    HYDROcodone-acetaminophen (NORCO/VICODIN) 5-325 MG tablet  Every 6 hours PRN     08/18/19 2333           Desma Mcgregor 08/19/19 2155    Virgel Manifold, MD 08/21/19 1905

## 2019-08-20 LAB — GC/CHLAMYDIA PROBE AMP (~~LOC~~) NOT AT ARMC
Chlamydia: NEGATIVE
Neisseria Gonorrhea: NEGATIVE

## 2019-08-20 LAB — URINE CULTURE

## 2019-09-17 ENCOUNTER — Ambulatory Visit (INDEPENDENT_AMBULATORY_CARE_PROVIDER_SITE_OTHER): Payer: Self-pay | Admitting: Obstetrics and Gynecology

## 2019-09-17 ENCOUNTER — Encounter: Payer: Self-pay | Admitting: Obstetrics and Gynecology

## 2019-09-17 ENCOUNTER — Other Ambulatory Visit: Payer: Self-pay

## 2019-09-17 VITALS — BP 99/69 | HR 121 | Ht 71.0 in | Wt 156.0 lb

## 2019-09-17 DIAGNOSIS — N920 Excessive and frequent menstruation with regular cycle: Secondary | ICD-10-CM

## 2019-09-17 DIAGNOSIS — D219 Benign neoplasm of connective and other soft tissue, unspecified: Secondary | ICD-10-CM

## 2019-09-17 DIAGNOSIS — D75839 Thrombocytosis, unspecified: Secondary | ICD-10-CM

## 2019-09-17 DIAGNOSIS — R102 Pelvic and perineal pain unspecified side: Secondary | ICD-10-CM

## 2019-09-17 DIAGNOSIS — D473 Essential (hemorrhagic) thrombocythemia: Secondary | ICD-10-CM

## 2019-09-17 DIAGNOSIS — Z1151 Encounter for screening for human papillomavirus (HPV): Secondary | ICD-10-CM

## 2019-09-17 DIAGNOSIS — R7989 Other specified abnormal findings of blood chemistry: Secondary | ICD-10-CM

## 2019-09-17 DIAGNOSIS — Z124 Encounter for screening for malignant neoplasm of cervix: Secondary | ICD-10-CM

## 2019-09-17 DIAGNOSIS — N939 Abnormal uterine and vaginal bleeding, unspecified: Secondary | ICD-10-CM

## 2019-09-17 DIAGNOSIS — F1911 Other psychoactive substance abuse, in remission: Secondary | ICD-10-CM

## 2019-09-17 DIAGNOSIS — E876 Hypokalemia: Secondary | ICD-10-CM | POA: Insufficient documentation

## 2019-09-17 MED ORDER — IBUPROFEN 600 MG PO TABS: 600.0000 mg | ORAL_TABLET | Freq: Four times a day (QID) | ORAL | Status: AC | PRN

## 2019-09-17 MED ORDER — MEDROXYPROGESTERONE ACETATE 10 MG PO TABS: ORAL_TABLET | ORAL | 1 refills | Status: DC

## 2019-09-17 MED ORDER — ACETAMINOPHEN 500 MG PO TABS: 1000.0000 mg | ORAL_TABLET | Freq: Three times a day (TID) | ORAL | 0 refills | Status: AC | PRN

## 2019-09-17 NOTE — Progress Notes (Signed)
Obstetrics and Gynecology New Patient Evaluation  Appointment Date: 09/17/2019  OBGYN Clinic: [facility]  Primary Care Provider: None  Referring Provider: [facility]  Chief Complaint: ?fibroid, AUB. Follow up ED visit  History of Present Illness: Ellen Stone is a 34 y.o. African-American G0 (Patient's last menstrual period was 09/04/2019 (approximate).), seen for the above chief complaint. Her past medical history is significant for h/o polysubstance abuse, elevated Cr, thrombocytosis   Patient went to ED on 2/2 for pain and bleeding. Exam unremarkable with CT and u/s showing 4cm large fibroid or polyp at the fundus  Patient states that prior to going to the ED she had bleeding and pain for about the past six week.  Prior to this she had regular, monthly periods that lasted for 4-5d and was mostly painful and somewhat heavy. Last bleeding was    Review of Systems: Pertinent items noted in HPI and remainder of comprehensive ROS otherwise negative.   Patient Active Problem List   Diagnosis Date Noted  . Thrombocytosis ([place]) 09/17/2019  . Elevated serum creatinine 09/17/2019  . Hypokalemia 09/17/2019  . Vitamin D deficiency 10/20/2015  . Cocaine abuse ([place]) 10/20/2015  . Hyperphosphatemia 10/20/2015     Past Medical History:  Past Medical History:  Diagnosis Date  . Chronic bronchitis ([place])   . Cocaine abuse ([place])   . Hyperphosphatemia 10/20/2015  . Recurrent UTI (urinary tract infection)    "none lately; since I started drinking more water" (10/18/2015)  . Vitamin D deficiency 10/20/2015    Past Surgical History:  Past Surgical History:  Procedure Laterality Date  . FRACTURE SURGERY    . ORIF PROXIMAL TIBIAL PLATEAU FRACTURE Left 10/18/2015  . ORIF TIBIA PLATEAU Left 10/18/2015   Procedure: OPEN REDUCTION INTERNAL FIXATION (ORIF) LEFT TIBIAL PLATEAU;  Surgeon: Altamese Zephyrhills West, MD;  Location: [facility];  Service: Orthopedics;  Laterality: Left;    Past  Obstetrical History:  OB History  Gravida Para Term Preterm AB Living  0 0 0 0 0 0  SAB TAB Ectopic Multiple Live Births  0 0 0 0 0    Past Gynecological History: As per HPI. Hormone history: was on depo provera for several years as a teen and did well with that  Periods: see above History of Pap Smear(s): unknown History of STI(s): No. She is currently using no method for contraception.   Social History:  Social History   Socioeconomic History  . Marital status: Single    Spouse name: Not on file  . Number of children: Not on file  . Years of education: Not on file  . Highest education level: Not on file  Occupational History  . Not on file  Tobacco Use  . Smoking status: Current Every Day Smoker    Packs/day: 1.00    Years: 10.00    Pack years: 10.00    Types: Cigarettes  . Smokeless tobacco: Never Used  Substance and Sexual Activity  . Alcohol use: Yes    Comment: 10/18/2015 "down to maybe 1 beer q 2 weeks or so"  . Drug use: Yes    Types: Cocaine, Marijuana    Comment: 10/18/2015 "quit cocaine recently"  . Sexual activity: Not Currently    Birth control/protection: None  Other Topics Concern  . Not on file  Social History Narrative  . Not on file   Social Determinants of Health   Financial Resource Strain:   . Difficulty of Paying Living Expenses: Not on file  Food Insecurity:   . Worried About Charity fundraiser in the Last Year: Not on file  . Ran Out of Food in the Last Year: Not on file  Transportation Needs:   . Lack of Transportation (Medical): Not on file  . Lack of Transportation (Non-Medical): Not on file  Physical Activity:   . Days of Exercise per Week: Not on file  . Minutes of Exercise per Session: Not on file  Stress:   . Feeling of Stress : Not on file  Social Connections:   . Frequency of Communication with Friends and Family: Not on file  . Frequency of Social Gatherings with Friends and Family: Not on file  . Attends Religious  Services: Not on file  . Active Member of Clubs or Organizations: Not on file  . Attends Archivist Meetings: Not on file  . Marital Status: Not on file  Intimate Partner Violence:   . Fear of Current or Ex-Partner: Not on file  . Emotionally Abused: Not on file  . Physically Abused: Not on file  . Sexually Abused: Not on file    Family History: No family history on file.  Medications Fran Lowes. Limes had no medications administered during this visit. Current Outpatient Medications  Medication Sig Dispense Refill  . Acetaminophen (MIDOL PO) Take 5 tablets by mouth 5 (five) times daily as needed (cramps).    Marland Kitchen acetaminophen (TYLENOL) 325 MG tablet Take 325 mg by mouth every 6 (six) hours as needed. Takes 6 at a time     No current facility-administered medications for this visit.    Allergies Patient has no known allergies.   Physical Exam:  BP 99/69   Pulse (!) 121   Ht 5\' 11"  (1.803 m)   Wt 156 lb (70.8 kg)   LMP 09/04/2019 (Approximate)   BMI 21.76 kg/m  Body mass index is 21.76 kg/m. General appearance: Well nourished, well developed female in no acute distress.  Neck:  Supple, normal appearance, and no thyromegaly  Cardiovascular: normal s1 and s2.  No murmurs, rubs or gallops. HR 100s Respiratory:  Clear to auscultation bilateral. Normal respiratory effort Abdomen: positive bowel sounds and no masses, hernias; diffusely non tender to palpation, non distended Neuro/Psych:  Normal mood and affect.  Skin:  Warm and dry.  Lymphatic:  No inguinal lymphadenopathy.   Pelvic exam: is not limited by body habitus EGBUS: within normal limits Vagina: within normal limits and with no blood or discharge in the vault Cervix: normal appearing cervix without tenderness, discharge or lesions.  Uterus:  enlarged, c/w 10 week size and non tender Adnexa:  normal adnexa and no mass, fullness, tenderness Rectovaginal: deferred  Laboratory: ED labs reviewed  Radiology:  CLINICAL DATA:  Pelvic pain.  EXAM: TRANSABDOMINAL AND TRANSVAGINAL ULTRASOUND OF PELVIS  DOPPLER ULTRASOUND OF OVARIES  TECHNIQUE: Both transabdominal and transvaginal ultrasound examinations of the pelvis were performed. Transabdominal technique was performed for global imaging of the pelvis including uterus, ovaries, adnexal regions, and pelvic cul-de-sac.  It was necessary to proceed with endovaginal exam following the transabdominal exam to visualize the endometrium and ovaries. Color and duplex Doppler ultrasound was utilized to evaluate blood flow to the ovaries.  COMPARISON:  CT from the same day.  FINDINGS: Uterus  Measurements: 9.1 x 5.6 x 6.2 cm = volume: 165 mL. There is suggestion of a 4.1 x 3.6 x 3.2 cm mass in the uterine fundus. This mass is heterogeneous. There is no definite hypervascularity. This may  be centered within the endometrial canal. There is possible pseudo shadowing.  Endometrium  Thickness: Cannot be evaluated.  Right ovary  Measurements: 1.9 x 1.4 x 1.6 cm = volume: 2.1 mL. Normal appearance/no adnexal mass.  Left ovary  Measurements: 2.8 x 1.8 x 3.6 cm = volume: 9.5 mL. There is a septated cyst measuring approximately 2.4 cm.  Pulsed Doppler evaluation of both ovaries demonstrates normal low-resistance arterial and venous waveforms.  Other findings  There is a trace amount of free fluid in the patient's pelvis. There may be debris within the urinary bladder.  IMPRESSION: 1. No evidence for ovarian torsion. 2. There is a heterogeneous 4.1 cm mass in the uterine fundus. The imaging characteristics favor a submucosal fibroid or polyp. However, in the setting of pelvic pain and leukocytosis follow-up with a nonemergent contrast enhanced pelvic MRI is recommended. 3. Trace amount of pelvic free fluid. 4. Probable debris within the urinary bladder. Correlation with urinalysis is recommended.   Electronically  Signed   By: Constance Holster M.D.   On: 08/18/2019 22:58  CLINICAL DATA:  Abdominal pain.  EXAM: CT ABDOMEN AND PELVIS WITH CONTRAST  TECHNIQUE: Multidetector CT imaging of the abdomen and pelvis was performed using the standard protocol following bolus administration of intravenous contrast.  CONTRAST:  141mL OMNIPAQUE IOHEXOL 300 MG/ML  SOLN  COMPARISON:  CT dated December 22, 2015  FINDINGS: Lower chest: There are a few subpleural nodules along the peripheral left lower lobe measuring up to approximately 5 mm (axial series 5, image 13).The heart size is normal.  Hepatobiliary: The liver is normal. The gallbladder is distended without CT evidence for acute cholecystitis.There is no biliary ductal dilation.  Pancreas: Normal contours without ductal dilatation. No peripancreatic fluid collection.  Spleen: No splenic laceration or hematoma.  Adrenals/Urinary Tract:  --Adrenal glands: No adrenal hemorrhage.  --Right kidney/ureter: No hydronephrosis or perinephric hematoma.  --Left kidney/ureter: No hydronephrosis or perinephric hematoma.  --Urinary bladder: The urinary bladder is decompressed which limits evaluation.  Stomach/Bowel:  --Stomach/Duodenum: No hiatal hernia or other gastric abnormality. Normal duodenal course and caliber.  --Small bowel: No dilatation or inflammation.  --Colon: No focal abnormality.  --Appendix: Normal.  Vascular/Lymphatic: Normal course and caliber of the major abdominal vessels.  --No retroperitoneal lymphadenopathy.  --No mesenteric lymphadenopathy.  --No pelvic or inguinal lymphadenopathy.  Reproductive: There is a heterogeneous appearance of the uterine fundus. The endometrial Stipe is somewhat indistinct. There is a hypoattenuating 3.3 cm area at the level of the uterine fundus. There appears to be a small amount of free fluid in the patient's cervix. There is a small amount of free fluid in the  patient's pelvis. There is a 2.6 cm left ovarian cystic structure. There may be some infiltration of the pelvic fat.  Other: No significant ascites. No free air. The abdominal wall is normal.  Musculoskeletal. No acute displaced fractures.  IMPRESSION: 1. Heterogeneous irregular appearance of the uterus is nonspecific and not well characterized on this exam. Differential considerations include endometritis or pelvic inflammatory disease. Follow-up with pelvic ultrasound is recommended. 2. Small amount of free fluid in the patient's pelvis. 3. 2.6 cm left ovarian cyst. 4. Peripheral 5 mm pulmonary nodules in the left lower lobe. No follow-up needed if patient is low-risk (and has no known or suspected primary neoplasm). Non-contrast chest CT can be considered in 12 months if patient is high-risk. This recommendation follows the consensus statement: Guidelines for Management of Incidental Pulmonary Nodules Detected on CT Images: From the  Fleischner Society 2017; Radiology 2017; [phone].   Electronically Signed   By: Constance Holster M.D.   On: 08/18/2019 21:00  Assessment: pt stable  Plan:  1. Thrombocytosis (HCC) - CBC  2. Hypokalemia - Basic metabolic panel  3. Elevated serum creatinine - Basic metabolic panel  4. History of drug abuse ([place]) - [id number] 11+Oxyco+Alc+Crt-Bund  5. Pelvic pain Hard to tell exact location but it looks more like a fibroid than a polyp on review of images. 2017 CT renal didn't she any fibroids so presumably it was too small to see or not there at that time.  I told her I recommend getting a pelvic MRI. If it's intramural then can consider medical therapy trial first. If it is intracavitary, whether a polyp or fibroid, then I'd lean towards removal over medical therapy  Pt is taking upper limit of apap. I told her recommended nsaid and apap dosing ?elevated Cr. - Cytology - PAP  6. Hyperphosphatemia - Phosphorus  7. Menorrhagia  with regular cycle PRN provera sent in in the mean tim  8. Abnormal uterine bleeding (AUB) - TSH - MR PELVIS W CONTRAST; Future  9. Fibroid - MR PELVIS W CONTRAST; Future  Orders Placed This Encounter  Procedures  . MR PELVIS W CONTRAST  . Basic metabolic panel  . CBC  . [id number] 11+Oxyco+Alc+Crt-Bund  . Phosphorus  . TSH    RTC d/w pt after MRI  Durene Romans MD Attending Center [facility])

## 2019-09-17 NOTE — Progress Notes (Signed)
Patient has elevated phq9, which she attributes to the pain

## 2019-09-18 LAB — CBC
Hematocrit: 34.8 % (ref 34.0–46.6)
Hemoglobin: 11.4 g/dL (ref 11.1–15.9)
MCH: 29.2 pg (ref 26.6–33.0)
MCHC: 32.8 g/dL (ref 31.5–35.7)
MCV: 89 fL (ref 79–97)
Platelets: 656 10*3/uL — ABNORMAL HIGH (ref 150–450)
RBC: 3.9 x10E6/uL (ref 3.77–5.28)
RDW: 13.3 % (ref 11.7–15.4)
WBC: 10.3 10*3/uL (ref 3.4–10.8)

## 2019-09-18 LAB — CYTOLOGY - PAP
Comment: NEGATIVE
Diagnosis: NEGATIVE
High risk HPV: NEGATIVE

## 2019-09-18 LAB — BASIC METABOLIC PANEL
BUN/Creatinine Ratio: 11 (ref 9–23)
BUN: 8 mg/dL (ref 6–20)
CO2: 21 mmol/L (ref 20–29)
Calcium: 9.5 mg/dL (ref 8.7–10.2)
Chloride: 101 mmol/L (ref 96–106)
Creatinine, Ser: 0.74 mg/dL (ref 0.57–1.00)
GFR calc Af Amer: 123 mL/min/{1.73_m2} (ref >59)
GFR calc non Af Amer: 107 mL/min/{1.73_m2} (ref >59)
Glucose: 93 mg/dL (ref 65–99)
Potassium: 4.5 mmol/L (ref 3.5–5.2)
Sodium: 137 mmol/L (ref 134–144)

## 2019-09-18 LAB — PHOSPHORUS: Phosphorus: 4.5 mg/dL — ABNORMAL HIGH (ref 3.0–4.3)

## 2019-09-18 LAB — TSH: TSH: 0.348 u[IU]/mL — ABNORMAL LOW (ref 0.450–4.500)

## 2019-09-27 ENCOUNTER — Ambulatory Visit (HOSPITAL_COMMUNITY)
Admission: RE | Admit: 2019-09-27 | Discharge: 2019-09-27 | Disposition: A | Discharge status: Home / Self Care | Payer: Self-pay | Source: Ambulatory Visit | Attending: Obstetrics and Gynecology | Admitting: Obstetrics and Gynecology

## 2019-09-27 ENCOUNTER — Emergency Department (HOSPITAL_COMMUNITY): Admission: EM | Admit: 2019-09-27 | Discharge status: Absent without leave | Payer: Self-pay | Source: Home / Self Care

## 2019-09-27 DIAGNOSIS — N939 Abnormal uterine and vaginal bleeding, unspecified: Secondary | ICD-10-CM | POA: Insufficient documentation

## 2019-09-27 DIAGNOSIS — D219 Benign neoplasm of connective and other soft tissue, unspecified: Secondary | ICD-10-CM | POA: Insufficient documentation

## 2019-09-27 MED ORDER — GADOBUTROL 1 MMOL/ML IV SOLN
7.0000 mL | Freq: Once | INTRAVENOUS | Status: AC | PRN
  Administered 2019-09-27: 7 mL via INTRAVENOUS

## 2019-09-29 ENCOUNTER — Other Ambulatory Visit: Payer: Self-pay | Admitting: Obstetrics and Gynecology

## 2019-09-29 ENCOUNTER — Telehealth: Payer: Self-pay | Admitting: Obstetrics and Gynecology

## 2019-09-29 DIAGNOSIS — N8003 Adenomyosis of the uterus: Secondary | ICD-10-CM | POA: Insufficient documentation

## 2019-09-29 DIAGNOSIS — R7989 Other specified abnormal findings of blood chemistry: Secondary | ICD-10-CM | POA: Insufficient documentation

## 2019-09-29 DIAGNOSIS — A5901 Trichomonal vulvovaginitis: Secondary | ICD-10-CM

## 2019-09-29 HISTORY — DX: Other specified abnormal findings of blood chemistry: R79.89

## 2019-09-29 MED ORDER — METRONIDAZOLE 500 MG PO TABS: 2000.0000 mg | ORAL_TABLET | Freq: Once | ORAL | 0 refills | Status: AC

## 2019-09-29 MED ORDER — MEDROXYPROGESTERONE ACETATE 10 MG PO TABS: ORAL_TABLET | ORAL | 1 refills | Status: DC

## 2019-09-29 NOTE — Progress Notes (Signed)
GYN Telephone Note Pap smear shows trich. Pt called again and d/w her and could also cause aub and pain too. Flagyl sent in  Durene Romans MD Attending [facility] for [facility] (Faculty Practice) 09/29/2019 Time: [phone]

## 2019-09-29 NOTE — Telephone Encounter (Signed)
GYN Telephone Note Patient [phone] and d/w her re: the MRI and blood work.  I told her the MRI shows no fibroids but adenomyosis which is likely causing the pelvic pain and potentially causing the AUB if not contributing to it.   In terms of the blood work, I told her that she still has high platelets, which could be reactionary to stress/aub, low tsh and slight electrolyte abnormalities.   She states that the provera is working in terms of helping with the pain and aub. She is applying for medicaid and [place] finanical assistance  Plan -refill provera -pt to follow up applying for medicaid and/or [place] financial assistance  -our clinic will set her up with a pcp because I told her that her labs need to be investigated before any surgical interventions are considered -pt states she is interested in hysterectomy but I told her we have time for additional work up before deciding on this  Durene Romans MD Attending [facility] ([place]) 09/29/2019 Time: 1352

## 2019-09-30 ENCOUNTER — Other Ambulatory Visit: Payer: Self-pay | Admitting: *Deleted

## 2019-09-30 DIAGNOSIS — R7989 Other specified abnormal findings of blood chemistry: Secondary | ICD-10-CM

## 2019-09-30 DIAGNOSIS — D473 Essential (hemorrhagic) thrombocythemia: Secondary | ICD-10-CM

## 2019-09-30 DIAGNOSIS — D75839 Thrombocytosis, unspecified: Secondary | ICD-10-CM

## 2019-09-30 LAB — DRUG SCREEN 764883 11+OXYCO+ALC+CRT-BUND
Amphetamines, Urine: NEGATIVE ng/mL
BENZODIAZ UR QL: NEGATIVE ng/mL
Barbiturate: NEGATIVE ng/mL
Cocaine (Metabolite): NEGATIVE ng/mL
Creatinine: 294.6 mg/dL (ref 20.0–300.0)
Ethanol: NEGATIVE %
Meperidine: NEGATIVE ng/mL
Methadone Screen, Urine: NEGATIVE ng/mL
OPIATE SCREEN URINE: NEGATIVE ng/mL
Oxycodone/Oxymorphone, Urine: NEGATIVE ng/mL
Phencyclidine: NEGATIVE ng/mL
Propoxyphene: NEGATIVE ng/mL
Tramadol: NEGATIVE ng/mL
pH, Urine: 5.7 (ref 4.5–8.9)

## 2019-09-30 LAB — CANNABINOID CONFIRMATION, UR
CANNABINOIDS: POSITIVE — AB
Carboxy THC GC/MS Conf: >300 ng/mL

## 2019-09-30 NOTE — Progress Notes (Signed)
Pt was called and informed that the referral for her to be seen @ [place] has been sent. She may call [phone] to schedule the appointment @ her convenience. Pt voiced understanding

## 2019-10-05 ENCOUNTER — Telehealth: Payer: Self-pay | Admitting: Obstetrics and Gynecology

## 2019-10-05 NOTE — Telephone Encounter (Signed)
Attempted to reach patient to see if she had turned in a financial assistance application. Was not able to reach her, she does not have a voicemail setup.

## 2019-10-06 ENCOUNTER — Telehealth: Payer: Self-pay | Admitting: Obstetrics and Gynecology

## 2019-10-06 NOTE — Telephone Encounter (Signed)
I spoke with Ellen Stone this morning about her financial application. She has stated she will bring her information in the office today.

## 2019-10-08 ENCOUNTER — Telehealth: Payer: Self-pay | Admitting: Obstetrics and Gynecology

## 2019-10-08 NOTE — Telephone Encounter (Signed)
Attempted to reach Ellen Stone to inform her we still needed more information to complete her financial application. She needs to have income information. Was not able to leave a message.

## 2019-10-22 ENCOUNTER — Other Ambulatory Visit: Payer: Self-pay

## 2019-10-22 ENCOUNTER — Ambulatory Visit: Payer: Self-pay | Attending: Family Medicine | Admitting: Physician Assistant

## 2019-10-22 VITALS — BP 102/71 | HR 101 | Temp 97.9°F | Ht 71.0 in | Wt 169.4 lb

## 2019-10-22 DIAGNOSIS — Z716 Tobacco abuse counseling: Secondary | ICD-10-CM

## 2019-10-22 DIAGNOSIS — R7989 Other specified abnormal findings of blood chemistry: Secondary | ICD-10-CM

## 2019-10-22 DIAGNOSIS — N8003 Adenomyosis of the uterus: Secondary | ICD-10-CM

## 2019-10-22 NOTE — Patient Instructions (Signed)
We are doing a lab today to check your thyroid function.  I have enclosed information about hyperthyroidism, just to be clear I am not giving you this diagnosis.  Great job on smoking cessation, keep up the good work.  Please make sure to sign up for my chart, it is a great tool for your health maintenance.   Hyperthyroidism  Hyperthyroidism is when the thyroid gland is too active (overactive). The thyroid gland is a small gland located in the lower front part of the neck, just in front of the windpipe (trachea). This gland makes hormones that help control how the body uses food for energy (metabolism) as well as how the heart and brain function. These hormones also play a role in keeping your bones strong. When the thyroid is overactive, it produces too much of a hormone called thyroxine. What are the causes? This condition may be caused by:  Graves' disease. This is a disorder in which the body's disease-fighting system (immune system) attacks the thyroid gland. This is the most common cause.  Inflammation of the thyroid gland.  A tumor in the thyroid gland.  Use of certain medicines, including: ? Prescription thyroid hormone replacement. ? Herbal supplements that mimic thyroid hormones. ? Amiodarone therapy.  Solid or fluid-filled lumps within your thyroid gland (thyroid nodules).  Taking in a large amount of iodine from foods or medicines. What increases the risk? You are more likely to develop this condition if:  You are female.  You have a family history of thyroid conditions.  You smoke tobacco.  You use a medicine called lithium.  You take medicines that affect the immune system (immunosuppressants). What are the signs or symptoms? Symptoms of this condition include:  Nervousness.  Inability to tolerate heat.  Unexplained weight loss.  Diarrhea.  Change in the texture of hair or skin.  Heart skipping beats or making extra beats.  Rapid heart rate.  Loss  of menstruation.  Shaky hands.  Fatigue.  Restlessness.  Sleep problems.  Enlarged thyroid gland or a lump in the thyroid (nodule). You may also have symptoms of Graves' disease, which may include:  Protruding eyes.  Dry eyes.  Red or swollen eyes.  Problems with vision. How is this diagnosed? This condition may be diagnosed based on:  Your symptoms and medical history.  A physical exam.  Blood tests.  Thyroid ultrasound. This test involves using sound waves to produce images of the thyroid gland.  A thyroid scan. A radioactive substance is injected into a vein, and images show how much iodine is present in the thyroid.  Radioactive iodine uptake test (RAIU). A small amount of radioactive iodine is given by mouth to see how much iodine the thyroid absorbs after a certain amount of time. How is this treated? Treatment depends on the cause and severity of the condition. Treatment may include:  Medicines to reduce the amount of thyroid hormone your body makes.  Radioactive iodine treatment (radioiodine therapy). This involves swallowing a small dose of radioactive iodine, in capsule or liquid form, to kill thyroid cells.  Surgery to remove part or all of your thyroid gland. You may need to take thyroid hormone replacement medicine for the rest of your life after thyroid surgery.  Medicines to help manage your symptoms. Follow these instructions at home:   Take over-the-counter and prescription medicines only as told by your health care provider.  Do not use any products that contain nicotine or tobacco, such as cigarettes and e-cigarettes. If  you need help quitting, ask your health care provider.  Follow any instructions from your health care provider about diet. You may be instructed to limit foods that contain iodine.  Keep all follow-up visits as told by your health care provider. This is important. ? You will need to have blood tests regularly so that your  health care provider can monitor your condition. Contact a health care provider if:  Your symptoms do not get better with treatment.  You have a fever.  You are taking thyroid hormone replacement medicine and you: ? Have symptoms of depression. ? Feel like you are tired all the time. ? Gain weight. Get help right away if:  You have chest pain.  You have decreased alertness or a change in your awareness.  You have abdominal pain.  You feel dizzy.  You have a rapid heartbeat.  You have an irregular heartbeat.  You have difficulty breathing. Summary  The thyroid gland is a small gland located in the lower front part of the neck, just in front of the windpipe (trachea).  Hyperthyroidism is when the thyroid gland is too active (overactive) and produces too much of a hormone called thyroxine.  The most common cause is Graves' disease, a disorder in which your immune system attacks the thyroid gland.  Hyperthyroidism can cause various symptoms, such as unexplained weight loss, nervousness, inability to tolerate heat, or changes in your heartbeat.  Treatment may include medicine to reduce the amount of thyroid hormone your body makes, radioiodine therapy, surgery, or medicines to manage symptoms. This information is not intended to replace advice given to you by your health care provider. Make sure you discuss any questions you have with your health care provider. Document Revised: 06/14/2017 Document Reviewed: 06/12/2017 Elsevier Patient Education  2020 [employer].

## 2019-10-22 NOTE — Progress Notes (Signed)
New Patient Office Visit  Subjective:  Patient ID: Ellen Stone, female    DOB: [birth year]  Age: 34 y.o. MRN: [id number]  CC:  Chief Complaint  Patient presents with  . Abnormal Lab    HPI [name] reports that she has been having abdominal cramping, abnormal menses for the past 7 to 8 months, states it became unbearable, [facility]  She was started on Tylenol and Provera.  Reports that it is starting to offer relief but is still having discomfort.  During her evaluation she had an abnormal thyroid reading.  Denies previous history of hyperthyroid.  Does endorse abnormal menses, states that her cycle has been running closer together, has noticed a fast heart rate and sweating while resting, has not measured her heart rate at home.  Reports that she has limited her smoking to 1 to 2 cigarettes a month.    Past Medical History:  Diagnosis Date  . Chronic bronchitis ([place])   . Cocaine abuse ([place])   . Hyperphosphatemia 10/20/2015  . Recurrent UTI (urinary tract infection)    "none lately; since I started drinking more water" (10/18/2015)  . Vitamin D deficiency 10/20/2015    Past Surgical History:  Procedure Laterality Date  . FRACTURE SURGERY    . ORIF PROXIMAL TIBIAL PLATEAU FRACTURE Left 10/18/2015  . ORIF TIBIA PLATEAU Left 10/18/2015   Procedure: OPEN REDUCTION INTERNAL FIXATION (ORIF) LEFT TIBIAL PLATEAU;  Surgeon: Altamese Skagway, MD;  Location: [facility];  Service: Orthopedics;  Laterality: Left;    Family History  Problem Relation Age of Onset  . Clotting disorder Mother   . Heart Problems Father   . Clotting disorder Maternal Uncle     Social History   Socioeconomic History  . Marital status: Single    Spouse name: Not on file  . Number of children: Not on file  . Years of education: Not on file  . Highest education level: Not on file  Occupational History  . Not on file  Tobacco Use  . Smoking status: Current Every Day  Smoker    Packs/day: 1.00    Years: 10.00    Pack years: 10.00    Types: Cigarettes  . Smokeless tobacco: Never Used  Substance and Sexual Activity  . Alcohol use: Yes    Comment: 10/18/2015 "down to maybe 1 beer q 2 weeks or so"  . Drug use: Yes    Types: Cocaine, Marijuana    Comment: 10/18/2015 "quit cocaine recently"  . Sexual activity: Not Currently    Birth control/protection: None  Other Topics Concern  . Not on file  Social History Narrative  . Not on file   Social Determinants of Health   Financial Resource Strain:   . Difficulty of Paying Living Expenses:   Food Insecurity:   . Worried About Charity fundraiser in the Last Year:   . Arboriculturist in the Last Year:   Transportation Needs:   . Film/video editor (Medical):   Marland Kitchen Lack of Transportation (Non-Medical):   Physical Activity:   . Days of Exercise per Week:   . Minutes of Exercise per Session:   Stress:   . Feeling of Stress :   Social Connections:   . Frequency of Communication with Friends and Family:   . Frequency of Social Gatherings with Friends and Family:   . Attends Religious Services:   . Active Member of Clubs or  Organizations:   . Attends Archivist Meetings:   Marland Kitchen Marital Status:   Intimate Partner Violence:   . Fear of Current or Ex-Partner:   . Emotionally Abused:   Marland Kitchen Physically Abused:   . Sexually Abused:     ROS Review of Systems  Constitutional: Positive for diaphoresis. Negative for unexpected weight change.  HENT: Negative.   Eyes: Negative.   Respiratory: Negative.   Cardiovascular: Negative.   Gastrointestinal: Positive for abdominal pain.  Endocrine: Negative.   Genitourinary: Positive for menstrual problem and pelvic pain.  Musculoskeletal: Negative.   Skin: Negative.   Allergic/Immunologic: Negative.   Neurological: Negative.   Hematological: Negative.   Psychiatric/Behavioral: Negative.     Objective:   Today's Vitals: BP 102/71 (BP Location: Left  Arm, Patient Position: Sitting, Cuff Size: Large)   Pulse (!) 101   Temp 97.9 F (36.6 C) (Oral)   Ht 5\' 11"  (1.803 m)   Wt 169 lb 6.4 oz (76.8 kg)   LMP 10/06/2019 (Exact Date)   SpO2 100%   BMI 23.63 kg/m   Physical Exam Vitals and nursing note reviewed.  Constitutional:      Appearance: Normal appearance.  HENT:     Head: Normocephalic and atraumatic.     Right Ear: External ear normal.     Left Ear: External ear normal.     Nose: Nose normal.     Mouth/Throat:     Mouth: Mucous membranes are moist.     Pharynx: Oropharynx is clear.  Eyes:     Extraocular Movements: Extraocular movements intact.     Conjunctiva/sclera: Conjunctivae normal.     Pupils: Pupils are equal, round, and reactive to light.  Cardiovascular:     Rate and Rhythm: Normal rate and regular rhythm.     Pulses: Normal pulses.     Heart sounds: Normal heart sounds.  Pulmonary:     Effort: Pulmonary effort is normal.     Breath sounds: Normal breath sounds.  Abdominal:     Tenderness: There is abdominal tenderness in the periumbilical area and suprapubic area.  Musculoskeletal:        General: Normal range of motion.     Cervical back: Normal range of motion and neck supple.  Skin:    General: Skin is warm and dry.  Neurological:     General: No focal deficit present.     Mental Status: She is alert and oriented to person, place, and time.  Psychiatric:        Mood and Affect: Mood normal.        Behavior: Behavior normal.        Thought Content: Thought content normal.        Judgment: Judgment normal.     Assessment & Plan:   Problem List Items Addressed This Visit      Genitourinary   Adenomyosis     Other   Low TSH level - Primary   Relevant Orders   Thyroid Panel With TSH    Other Visit Diagnoses    Tobacco abuse counseling          Outpatient Encounter Medications as of 10/22/2019  Medication Sig  . medroxyPROGESTERone (PROVERA) 10 MG tablet 1 tab po q8h prn heavy vaginal  bleeding, pain   No facility-administered encounter medications on file as of 10/22/2019.  1. Low TSH level Repeat thyroid panel today.  Gave patient education on hyperthyroidism.  Patient is uninsured and is applying for [employer] health financial  assistance.  Encouraged patient to continue with Tylenol and Provera continue working on smoking cessation.  Patient will return in 4 weeks to establish primary care provider. - Thyroid Panel With TSH  2. Adenomyosis [facility]  They recommended evaluation of thyroid prior to proceeding with any further procedures for her adenomyosis   I have reviewed the patient's medical history (PMH, PSH, Social History, Family History, Medications, and allergies) , and have been updated if relevant. I spent 30 minutes reviewing chart and  face to face time with patient.     Follow-up: Return in about 4 weeks (around 11/19/2019) for To establish PCP.   Loraine Grip Rayven Rettig, PA-C

## 2019-10-22 NOTE — Progress Notes (Signed)
New Patient   HFU

## 2019-10-23 ENCOUNTER — Ambulatory Visit: Payer: Self-pay | Admitting: Family Medicine

## 2019-10-23 LAB — THYROID PANEL WITH TSH
Free Thyroxine Index: 1.4 (ref 1.2–4.9)
T3 Uptake Ratio: 26 % (ref 24–39)
T4, Total: 5.3 ug/dL (ref 4.5–12.0)
TSH: 0.757 u[IU]/mL (ref 0.450–4.500)

## 2019-10-29 ENCOUNTER — Telehealth: Payer: Self-pay | Admitting: *Deleted

## 2019-10-29 NOTE — Telephone Encounter (Signed)
Patient verified DOB Patient is aware of TSH being normal and to keep 5/13 appointment with [facility] to establish care and follow up.

## 2019-11-18 ENCOUNTER — Telehealth: Payer: Self-pay

## 2019-11-18 NOTE — Telephone Encounter (Signed)
Pt called stated that she was told by her pharmacy that her medication needs an authorization.

## 2019-11-23 NOTE — Telephone Encounter (Signed)
I called Ellen Stone and heard a message she had a voice mailbox that has not been set up. Sahaj Bona,RN

## 2019-11-26 ENCOUNTER — Other Ambulatory Visit: Payer: Self-pay

## 2019-11-26 ENCOUNTER — Ambulatory Visit: Payer: Self-pay | Attending: Internal Medicine | Admitting: Internal Medicine

## 2019-11-26 DIAGNOSIS — N921 Excessive and frequent menstruation with irregular cycle: Secondary | ICD-10-CM

## 2019-11-26 DIAGNOSIS — N946 Dysmenorrhea, unspecified: Secondary | ICD-10-CM

## 2019-11-26 DIAGNOSIS — D75839 Thrombocytosis, unspecified: Secondary | ICD-10-CM

## 2019-11-26 DIAGNOSIS — D473 Essential (hemorrhagic) thrombocythemia: Secondary | ICD-10-CM

## 2019-11-26 DIAGNOSIS — N8003 Adenomyosis of the uterus: Secondary | ICD-10-CM

## 2019-11-26 DIAGNOSIS — N800 Endometriosis of uterus: Secondary | ICD-10-CM

## 2019-11-26 MED ORDER — MEDROXYPROGESTERONE ACETATE 10 MG PO TABS: ORAL_TABLET | ORAL | 0 refills | Status: DC

## 2019-11-26 NOTE — Progress Notes (Signed)
Virtual Visit via Telephone Note Due to current restrictions/limitations of in-office visits due to the COVID-19 pandemic, this scheduled clinical appointment was converted to a telehealth visit  I connected with Ellen Stone on 11/26/19 at 4:09 p.m by telephone and verified that I am speaking with the correct person using two identifiers. I am in my office.  The patient is at home.  Only the patient and myself participated in this encounter.  I discussed the limitations, risks, security and privacy concerns of performing an evaluation and management service by telephone and the availability of in person appointments. I also discussed with the patient that there may be a patient responsible charge related to this service. The patient expressed understanding and agreed to proceed.  Lab Results  Component Value Date   WBC 10.3 09/17/2019   HGB 11.4 09/17/2019   HCT 34.8 09/17/2019   MCV 89 09/17/2019   PLT 656 (H) 09/17/2019    History of Present Illness: Patient with history of tobacco dependence, adenomyosis with menorrhagia with regular cycle pelvic pain, thrombocytosis   Pt requesting RF on Provera.  She had called the gynecologist and they did try to call her back but her mailbox was not set up on the cell phone.  She saw Dr. Ilda Basset in March.  She had MRI of the pelvis which revealed significant adenomyosis.  She has not followed up as yet. On CBC she was noted to have thrombocytosis.  Observations/Objective: No direct observation as this was a telephone encounter.  Assessment and Plan: 1. Adenomyosis Advised patient that I will give 1 month refill on Provera but she needs to follow-up with the gynecologist. - Ambulatory referral to Gynecology - medroxyPROGESTERone (PROVERA) 10 MG tablet; 1 tab po q8h prn heavy vaginal bleeding, pain  Dispense: 90 tablet; Refill: 0  2. Menorrhagia with irregular cycle - Ambulatory referral to Gynecology - medroxyPROGESTERone (PROVERA) 10 MG  tablet; 1 tab po q8h prn heavy vaginal bleeding, pain  Dispense: 90 tablet; Refill: 0  3. Dysmenorrhea - Ambulatory referral to Gynecology  4. Thrombocytosis ([place]) - Ambulatory referral to Hematology   Follow Up Instructions:    I discussed the assessment and treatment plan with the patient. The patient was provided an opportunity to ask questions and all were answered. The patient agreed with the plan and demonstrated an understanding of the instructions.   The patient was advised to call back or seek an in-person evaluation if the symptoms worsen or if the condition fails to improve as anticipated.  I provided  5 minutes of non-face-to-face time during this encounter.   Karle Plumber, MD

## 2019-11-27 ENCOUNTER — Telehealth: Payer: Self-pay | Admitting: Hematology and Oncology

## 2019-11-27 NOTE — Telephone Encounter (Signed)
Received a new hem referral from Dr. Wynetta Emery for thrombocytosis. Pt has been cld and scheduled to see Dr. Lorenso Courier on 5/20 at 2pm. Pt aware to arrive 15 minutes early.

## 2019-12-01 ENCOUNTER — Other Ambulatory Visit: Payer: Self-pay | Admitting: General Practice

## 2019-12-03 ENCOUNTER — Inpatient Hospital Stay: Payer: Self-pay

## 2019-12-03 ENCOUNTER — Other Ambulatory Visit: Payer: Self-pay

## 2019-12-03 ENCOUNTER — Encounter: Payer: Self-pay | Admitting: Hematology and Oncology

## 2019-12-03 ENCOUNTER — Inpatient Hospital Stay: Payer: Self-pay | Attending: Hematology and Oncology | Admitting: Hematology and Oncology

## 2019-12-03 VITALS — BP 112/78 | HR 88 | Temp 97.7°F | Resp 16 | Ht 71.0 in | Wt 174.2 lb

## 2019-12-03 DIAGNOSIS — D72825 Bandemia: Secondary | ICD-10-CM

## 2019-12-03 DIAGNOSIS — R7989 Other specified abnormal findings of blood chemistry: Secondary | ICD-10-CM | POA: Insufficient documentation

## 2019-12-03 DIAGNOSIS — D72829 Elevated white blood cell count, unspecified: Secondary | ICD-10-CM | POA: Insufficient documentation

## 2019-12-03 DIAGNOSIS — Z87440 Personal history of urinary (tract) infections: Secondary | ICD-10-CM | POA: Insufficient documentation

## 2019-12-03 DIAGNOSIS — N920 Excessive and frequent menstruation with regular cycle: Secondary | ICD-10-CM | POA: Insufficient documentation

## 2019-12-03 DIAGNOSIS — D75839 Thrombocytosis, unspecified: Secondary | ICD-10-CM

## 2019-12-03 DIAGNOSIS — F17210 Nicotine dependence, cigarettes, uncomplicated: Secondary | ICD-10-CM | POA: Insufficient documentation

## 2019-12-03 DIAGNOSIS — Z7982 Long term (current) use of aspirin: Secondary | ICD-10-CM | POA: Insufficient documentation

## 2019-12-03 DIAGNOSIS — Z793 Long term (current) use of hormonal contraceptives: Secondary | ICD-10-CM | POA: Insufficient documentation

## 2019-12-03 LAB — CBC WITH DIFFERENTIAL (CANCER CENTER ONLY)
Abs Immature Granulocytes: 0.03 10*3/uL (ref 0.00–0.07)
Basophils Absolute: 0 10*3/uL (ref 0.0–0.1)
Basophils Relative: 0 %
Eosinophils Absolute: 0.1 10*3/uL (ref 0.0–0.5)
Eosinophils Relative: 1 %
HCT: 32.6 % — ABNORMAL LOW (ref 36.0–46.0)
Hemoglobin: 10.5 g/dL — ABNORMAL LOW (ref 12.0–15.0)
Immature Granulocytes: 0 %
Lymphocytes Relative: 24 %
Lymphs Abs: 2.5 10*3/uL (ref 0.7–4.0)
MCH: 27.3 pg (ref 26.0–34.0)
MCHC: 32.2 g/dL (ref 30.0–36.0)
MCV: 84.7 fL (ref 80.0–100.0)
Monocytes Absolute: 0.8 10*3/uL (ref 0.1–1.0)
Monocytes Relative: 7 %
Neutro Abs: 6.8 10*3/uL (ref 1.7–7.7)
Neutrophils Relative %: 68 %
Platelet Count: 546 10*3/uL — ABNORMAL HIGH (ref 150–400)
RBC: 3.85 MIL/uL — ABNORMAL LOW (ref 3.87–5.11)
RDW: 18.6 % — ABNORMAL HIGH (ref 11.5–15.5)
WBC Count: 10.2 10*3/uL (ref 4.0–10.5)
nRBC: 0 % (ref 0.0–0.2)

## 2019-12-03 LAB — CMP (CANCER CENTER ONLY)
ALT: 11 U/L (ref 0–44)
AST: 14 U/L — ABNORMAL LOW (ref 15–41)
Albumin: 4.2 g/dL (ref 3.5–5.0)
Alkaline Phosphatase: 41 U/L (ref 38–126)
Anion gap: 7 (ref 5–15)
BUN: 7 mg/dL (ref 6–20)
CO2: 24 mmol/L (ref 22–32)
Calcium: 8.9 mg/dL (ref 8.9–10.3)
Chloride: 108 mmol/L (ref 98–111)
Creatinine: 0.7 mg/dL (ref 0.44–1.00)
GFR, Est AFR Am: >60 mL/min (ref >60)
GFR, Estimated: >60 mL/min (ref >60)
Glucose, Bld: 80 mg/dL (ref 70–99)
Potassium: 4.2 mmol/L (ref 3.5–5.1)
Sodium: 139 mmol/L (ref 135–145)
Total Bilirubin: <0.2 mg/dL — ABNORMAL LOW (ref 0.3–1.2)
Total Protein: 7.6 g/dL (ref 6.5–8.1)

## 2019-12-03 LAB — RETIC PANEL
Immature Retic Fract: 28.7 % — ABNORMAL HIGH (ref 2.3–15.9)
RBC.: 3.87 MIL/uL (ref 3.87–5.11)
Retic Count, Absolute: 61.9 10*3/uL (ref 19.0–186.0)
Retic Ct Pct: 1.6 % (ref 0.4–3.1)
Reticulocyte Hemoglobin: 31.5 pg (ref >27.9)

## 2019-12-03 LAB — LACTATE DEHYDROGENASE: LDH: 147 U/L (ref 98–192)

## 2019-12-03 LAB — SEDIMENTATION RATE: Sed Rate: 7 mm/hr (ref 0–22)

## 2019-12-03 LAB — C-REACTIVE PROTEIN: CRP: <0.5 mg/dL (ref <1.0)

## 2019-12-03 LAB — SAVE SMEAR(SSMR), FOR PROVIDER SLIDE REVIEW

## 2019-12-03 NOTE — Progress Notes (Signed)
[facility] [phone]   Fax:([phone] [place] NOTE  Patient Care Team: Ladell Pier, MD as PCP - General (Internal Medicine)  Hematological/Oncological History # Thrombocytosis # Leukocytosis 1) 11/26/2010: WBC 16.3, Hgb 15.0, MCV 88.5, Plt 207. First CBC on record 2) 10/13/2015: WBC 8.7, Hgb 12.2, MCV 88.8, Plt 442 3) 07/11/2019: WBC 15.5, Hgb 13.7, MCV 91.7, Plt 842 4) 08/18/2019: WBC 12.5, Hgb 13.5, MCV 90.5, Plt 842 5) 09/17/2019: WBC 10.3, Hgb 11.4, MCV 89, Plt 656 6) 12/03/2019: establish care with Dr. Lorenso Courier   CHIEF COMPLAINTS/PURPOSE OF CONSULTATION:  "Thrombocytosis "  HISTORY OF PRESENTING ILLNESS:  Ellen Stone 34 y.o. female with medical history significant for chronic bronchitis, tobacco use, and recurrent UTI who presents for evaluation of chronic leukocytosis/thrombocytosis.   On review of the previous records Ellen Stone is a longstanding history of leukocytosis dating back to at least 11/26/2010.  At that time she was noted to have white blood cell count of 37.8 with neutrophilic predominance.  This was the first CBC that we have on record.  She subsequently had a normal white blood cell count in 2017 but every CBC thereafter for the next 9 years was elevated, with a local maximum of 19.8 in 2017.  The patient's thrombocytosis became most evident in 2017 when she had a white blood cell count of 8.7, platelet of 422.  She had a platelet high of 842 in February 2021.  Most recently on 09/17/2019 the patient had a white blood cell count of 10.3, hemoglobin 11.4, and a platelet count of 656.  During this entire time the patient has had an MCV within normal limits.  Due to concern for this patient's longstanding thrombocytosis she was referred to hematology for further evaluation management.  On exam today Ellen Stone notes that she has been having difficulties with her menstrual cycles.  She notes that she has a uterine fibroid and has  painful menstrual cycles as well as very irregular ones.  She notes that she was on her period for the entire month of June 2020.  She notes that she can use up to 48 pads during the whole cycle, with 5-7 pads during the worst day.  She notes that frequently these pads are saturated.  She reports that she has never been told she was iron deficient has not been on iron supplementation before.  At this time she denies having any other overt signs of bleeding such as nosebleeds, bruising, or dark stools.  She notes that she is a very picky eater and does not consume red meat.  She does note that she eats a lot of pork in the form of bacon and ham as well as some fish and chicken.  She does not eat much in the way of vegetables.  Her family history is remarkable for a maternal uncle who died of a blood clot in a maternal grandmother who passed away of brain cancer.  The patient is an active smoker and smokes approximately 1 pack/day and has done so for the last 16 to 17 years.  She has stopped drinking alcohol in 2013 as it was a problem at that time.  Additionally she had a crack cocaine habit which she has quit within the last 8 months.  At this time she denies having any issues with fevers, chills, sweats, nausea, vomiting or diarrhea.  A full 10 point ROS is otherwise negative.  MEDICAL HISTORY:  Past Medical History:  Diagnosis  Date  . Chronic bronchitis ([place])   . Cocaine abuse ([place])   . Hyperphosphatemia 10/20/2015  . Recurrent UTI (urinary tract infection)    "none lately; since I started drinking more water" (10/18/2015)  . Vitamin D deficiency 10/20/2015    SURGICAL HISTORY: Past Surgical History:  Procedure Laterality Date  . FRACTURE SURGERY    . ORIF PROXIMAL TIBIAL PLATEAU FRACTURE Left 10/18/2015  . ORIF TIBIA PLATEAU Left 10/18/2015   Procedure: OPEN REDUCTION INTERNAL FIXATION (ORIF) LEFT TIBIAL PLATEAU;  Surgeon: Altamese Zwolle, MD;  Location: [facility];  Service: Orthopedics;  Laterality: Left;      SOCIAL HISTORY: Social History   Socioeconomic History  . Marital status: Single    Spouse name: Not on file  . Number of children: Not on file  . Years of education: Not on file  . Highest education level: Not on file  Occupational History  . Not on file  Tobacco Use  . Smoking status: Current Every Day Smoker    Packs/day: 1.00    Years: 10.00    Pack years: 10.00    Types: Cigarettes  . Smokeless tobacco: Never Used  Substance and Sexual Activity  . Alcohol use: Yes    Comment: 10/18/2015 "down to maybe 1 beer q 2 weeks or so"  . Drug use: Yes    Types: Cocaine, Marijuana    Comment: 10/18/2015 "quit cocaine recently"  . Sexual activity: Not Currently    Birth control/protection: None  Other Topics Concern  . Not on file  Social History Narrative  . Not on file   Social Determinants of Health   Financial Resource Strain:   . Difficulty of Paying Living Expenses:   Food Insecurity:   . Worried About Charity fundraiser in the Last Year:   . Arboriculturist in the Last Year:   Transportation Needs:   . Film/video editor (Medical):   Marland Kitchen Lack of Transportation (Non-Medical):   Physical Activity:   . Days of Exercise per Week:   . Minutes of Exercise per Session:   Stress:   . Feeling of Stress :   Social Connections:   . Frequency of Communication with Friends and Family:   . Frequency of Social Gatherings with Friends and Family:   . Attends Religious Services:   . Active Member of Clubs or Organizations:   . Attends Archivist Meetings:   Marland Kitchen Marital Status:   Intimate Partner Violence:   . Fear of Current or Ex-Partner:   . Emotionally Abused:   Marland Kitchen Physically Abused:   . Sexually Abused:     FAMILY HISTORY: Family History  Problem Relation Age of Onset  . Clotting disorder Mother   . Heart Problems Father   . Clotting disorder Maternal Uncle     ALLERGIES:  has No Known Allergies.  MEDICATIONS:  Current Outpatient Medications   Medication Sig Dispense Refill  . acetaminophen (TYLENOL) 500 MG tablet Take 500 mg by mouth every 6 (six) hours as needed.    . medroxyPROGESTERone (PROVERA) 10 MG tablet 1 tab po q8h prn heavy vaginal bleeding, pain 90 tablet 0   No current facility-administered medications for this visit.    REVIEW OF SYSTEMS:   Constitutional: ( - ) fevers, ( - )  chills , ( - ) night sweats Eyes: ( - ) blurriness of vision, ( - ) double vision, ( - ) watery eyes Ears, nose, mouth, throat, and face: ( - )  mucositis, ( - ) sore throat Respiratory: ( - ) cough, ( - ) dyspnea, ( - ) wheezes Cardiovascular: ( - ) palpitation, ( - ) chest discomfort, ( - ) lower extremity swelling Gastrointestinal:  ( - ) nausea, ( - ) heartburn, ( - ) change in bowel habits Skin: ( - ) abnormal skin rashes Lymphatics: ( - ) new lymphadenopathy, ( - ) easy bruising Neurological: ( - ) numbness, ( - ) tingling, ( - ) new weaknesses Behavioral/Psych: ( - ) mood change, ( - ) new changes  All other systems were reviewed with the patient and are negative.  PHYSICAL EXAMINATION: ECOG PERFORMANCE STATUS: 0 - Asymptomatic  Vitals:   12/03/19 1418  BP: 112/78  Pulse: 88  Resp: 16  Temp: 97.7 F (36.5 C)  SpO2: 100%   Filed Weights   12/03/19 1418  Weight: 174 lb 3.2 oz (79 kg)    GENERAL: well appearing middle aged [place] American female in NAD  SKIN: skin color, texture, turgor are normal, no rashes or significant lesions EYES: conjunctiva are pink and non-injected, sclera clear OROPHARYNX: no exudate, no erythema; lips, buccal mucosa, and tongue normal  LUNGS: clear to auscultation and percussion with normal breathing effort HEART: regular rate & rhythm and no murmurs and no lower extremity edema Musculoskeletal: no cyanosis of digits and no clubbing  PSYCH: alert & oriented x 3, fluent speech NEURO: no focal motor/sensory deficits  LABORATORY DATA:  I have reviewed the data as listed CBC Latest Ref Rng &  Units 09/17/2019 08/18/2019 07/11/2019  WBC 3.4 - 10.8 x10E3/uL 10.3 12.5(H) 15.5(H)  Hemoglobin 11.1 - 15.9 g/dL 11.4 13.5 13.7  Hematocrit 34.0 - 46.6 % 34.8 40.8 40.9  Platelets 150 - 450 x10E3/uL 656(H) 842(H) 434(H)    CMP Latest Ref Rng & Units 09/17/2019 08/18/2019 07/11/2019  Glucose 65 - 99 mg/dL 93 116(H) 112(H)  BUN 6 - 20 mg/dL _0 Creatinine 0.57 - 1.00 mg/dL 0.74 1.12(H) 0.70  Sodium 134 - 144 mmol/L 137 137 135  Potassium 3.5 - 5.2 mmol/L 4.5 3.2(L) 3.5  Chloride 96 - 106 mmol/L 101 99 99  CO2 20 - 29 mmol/L 21 20(L) 25  Calcium 8.7 - 10.2 mg/dL 9.5 9.5 9.0  Total Protein 6.5 - 8.1 g/dL - 8.8(H) -  Total Bilirubin 0.3 - 1.2 mg/dL - 0.4 -  Alkaline Phos 38 - 126 U/L - 64 -  AST 15 - 41 U/L - 29 -  ALT 0 - 44 U/L - 21 -     PATHOLOGY: None relevant to review.    RADIOGRAPHIC STUDIES: None relevant to review.  No results found.  ASSESSMENT & PLAN Ellen Stone 34 y.o. female with medical history significant for chronic bronchitis, tobacco use, and recurrent UTI who presents for evaluation of chronic leukocytosis/thrombocytosis.  At this time there are 3 possible etiologies for the patient's findings.  The most likely causes are inflammation, myeloproliferative neoplasm, or iron deficiency.  Inflammation myeloproliferative neoplasm both explain the leukocytosis, however the iron deficiency does not do this.  Smoking is the most likely etiology of her leukocytosis, but we cannot exclude the possibility that these 2 saline increases are related.  Iron deficiency anemia is the most likely etiology given her heavy menstrual cycles and the fitting of the clinical picture.  We will order iron studies in order to effectively rule this out.  In order to rule out inflammatory conditions we will order ESR and CRP.  Given that she has had 2 saline elevations with 1 being chronic for the last 12 years I do think it be worthwhile to pursue an MPN evaluation with a BCR able fish as well as  a JAK2 panel with reflex.  In the interim we can have the patient take baby aspirin 81 mg daily as thromboprophylaxis.  Bone marrow biopsy may be required if a myeloproliferative neoplasm is suspected.  In the event the patient is iron deficient we will start her on p.o. iron supplementation 325 mg ferrous sulfate daily with a source of vitamin C and have her follow-up in clinic in approximately 3 months time.  If no clear etiology can be discerned we will have her simply follow-up in 6 months time.  # Thrombocytosis, chronic # Leukocytosis, neutrophilia --numerous possible etiologies including inflammation (smoking), MPN, or iron deficiency anemia. Will work up all these potential etiologies today --will order CBC, CMP retic panel, and iron studies with ferritin --additionally will collect ESR and CRP --MPN workup with BCR/ABL FISH and JAK2 w/ reflex --can recommend taking a baby ASA 22m daily as thromboprophylaxis while we begin this workup.  --the need for Bmbx can be determined by the genetic workup --RTC pending the results of the above labs (3 months if iron deficiency, 6 months if no etiology found)   No orders of the defined types were placed in this encounter.   All questions were answered. The patient knows to call the clinic with any problems, questions or concerns.  A total of more than 60 minutes were spent on this encounter and over half of that time was spent on counseling and coordination of care as outlined above.   JLedell Peoples MD Department of Hematology/Oncology [place]at [facility] HospitalPhone: [phone]: [phone]: [email or web address]  12/03/2019 2:30 PM

## 2019-12-04 ENCOUNTER — Telehealth: Payer: Self-pay | Admitting: Hematology and Oncology

## 2019-12-04 LAB — IRON AND TIBC
Iron: 13 ug/dL — ABNORMAL LOW (ref 41–142)
Saturation Ratios: 3 % — ABNORMAL LOW (ref 21–57)
TIBC: 494 ug/dL — ABNORMAL HIGH (ref 236–444)
UIBC: 481 ug/dL — ABNORMAL HIGH (ref 120–384)

## 2019-12-04 LAB — FERRITIN: Ferritin: 12 ng/mL (ref 11–307)

## 2019-12-04 NOTE — Telephone Encounter (Signed)
Scheduled per 05/21 los, patient has been called and notified of upcoming appointments.

## 2019-12-10 ENCOUNTER — Telehealth: Payer: Self-pay | Admitting: *Deleted

## 2019-12-10 NOTE — Telephone Encounter (Signed)
TCT patient regarding recent lab results. Spoke with pt's family member. He will have her call back in the morning.

## 2019-12-10 NOTE — Telephone Encounter (Signed)
-----   Message from Orson Slick, MD sent at 12/09/2019 10:38 AM EDT ----- Please let Ellen Stone know that her bloodwork was most consistent with Iron Deficiency Anemia. We can call in ferrous sulfate 325mg  PO daily with a source of vitamin C to a pharmacy of her preference.   We will see her back in about 3 months to assure it is working.  Colan Neptune   ----- Message ----- From: Buel Ream, Lab In [place] Sent: 12/03/2019   3:17 PM EDT To: Orson Slick, MD

## 2019-12-11 ENCOUNTER — Telehealth: Payer: Self-pay | Admitting: *Deleted

## 2019-12-11 ENCOUNTER — Other Ambulatory Visit: Payer: Self-pay | Admitting: *Deleted

## 2019-12-11 MED ORDER — FERROUS SULFATE 325 (65 FE) MG PO TBEC: 325.0000 mg | DELAYED_RELEASE_TABLET | Freq: Every day | ORAL | 3 refills | Status: DC

## 2019-12-11 MED ORDER — FERROUS SULFATE 325 (65 FE) MG PO TBEC
325.0000 mg | DELAYED_RELEASE_TABLET | Freq: Every day | ORAL | 3 refills | Status: DC
Start: 2019-12-11 — End: 2019-12-11

## 2019-12-11 NOTE — Telephone Encounter (Signed)
-----   Message from Orson Slick, MD sent at 12/09/2019 10:38 AM EDT ----- Please let Mrs. Bob know that her bloodwork was most consistent with Iron Deficiency Anemia. We can call in ferrous sulfate 325mg  PO daily with a source of vitamin C to a pharmacy of her preference.   We will see her back in about 3 months to assure it is working.  Colan Neptune   ----- Message ----- From: Buel Ream, Lab In [place] Sent: 12/03/2019   3:17 PM EDT To: Orson Slick, MD

## 2019-12-11 NOTE — Telephone Encounter (Signed)
Received call back from patient regarding labs.  Advised that she does have iron deficiency anemia and will need to take oral iron supplement.  Instructed patient on correct way to take oral iron-with Vitamin C source, no dairy products 2 hours before to 2 hours after taking oral iron. Advised about the potential for constipation and her stools being darker in color. Pt voiced understanding. Prescription for oral iron sent in to [facility] on [address]

## 2019-12-15 ENCOUNTER — Telehealth (INDEPENDENT_AMBULATORY_CARE_PROVIDER_SITE_OTHER): Payer: Self-pay | Admitting: Obstetrics and Gynecology

## 2019-12-15 DIAGNOSIS — Z760 Encounter for issue of repeat prescription: Secondary | ICD-10-CM

## 2019-12-15 NOTE — Telephone Encounter (Signed)
Patient called about getting more medication. She is about to run out.

## 2019-12-15 NOTE — Telephone Encounter (Signed)
Returned patients call, She would like to have a refill on her Provera. She reports she is taking it every 8 hours. She is in a lot a pain if she does not take her Provera. She reports her PCP filled the last time and advised that she needs to follow up with Dr. Ilda Basset.   She reports all her testing is coming back normal except for anemia.   She reports she is still having pain in the ovary area and would like to set up an appointment with Dr. Ilda Basset.   Note to Dr. Ilda Basset to advise on Provera refill. Message to front desk to make follow up appt with Dr. Ilda Basset.

## 2019-12-18 ENCOUNTER — Other Ambulatory Visit: Payer: Self-pay | Admitting: Lactation Services

## 2019-12-18 MED ORDER — MEDROXYPROGESTERONE ACETATE 10 MG PO TABS
10.0000 mg | ORAL_TABLET | Freq: Three times a day (TID) | ORAL | 3 refills | Status: DC
Start: 2019-12-18 — End: 2020-05-10

## 2019-12-18 NOTE — Telephone Encounter (Signed)
Okay to give 3 months worth with 1 refill

## 2019-12-18 NOTE — Telephone Encounter (Signed)
Called patient to let her know I have sent in refills for her Provera. Patient did not answer and voicemail not set up yet so not able to leave message. Patient is not active with My Chart.

## 2019-12-24 LAB — JAK2 (INCLUDING V617F AND EXON 12), MPL,& CALR W/RFL MPN PANEL (NGS)

## 2019-12-24 LAB — BCR ABL1 FISH (GENPATH)

## 2019-12-31 ENCOUNTER — Telehealth: Payer: Self-pay | Admitting: *Deleted

## 2019-12-31 NOTE — Telephone Encounter (Signed)
Pt left VM message requesting refill of Medroxyprogesterone.

## 2020-01-01 ENCOUNTER — Telehealth: Payer: Self-pay | Admitting: *Deleted

## 2020-01-01 NOTE — Telephone Encounter (Signed)
Kenlyn left another message she is calling about a refill on her medroxyprogesterone and she missed out call back this am.  Per chart refill already sent in. I called Carin and again heard a message voicemail not set up. Jacques Navy

## 2020-01-01 NOTE — Telephone Encounter (Signed)
Per chart refill was sent in earlier this month for 3 month supply. I called [facility] and was unable to leave a message. I heard a message voicemail not set up. Weaver Tweed,RN

## 2020-01-12 ENCOUNTER — Telehealth: Payer: Self-pay

## 2020-01-12 NOTE — Telephone Encounter (Signed)
Pt called requesting refill on her Provera.  Contacted pt's pharmacy and refilled pt's Provera.  Pt notified.   Mel Almond, RN

## 2020-01-27 ENCOUNTER — Encounter: Payer: Self-pay | Admitting: Obstetrics and Gynecology

## 2020-01-27 ENCOUNTER — Other Ambulatory Visit: Payer: Self-pay

## 2020-01-27 ENCOUNTER — Ambulatory Visit (INDEPENDENT_AMBULATORY_CARE_PROVIDER_SITE_OTHER): Payer: Self-pay | Admitting: Obstetrics and Gynecology

## 2020-01-27 VITALS — BP 105/79 | HR 94 | Wt 169.7 lb

## 2020-01-27 DIAGNOSIS — N939 Abnormal uterine and vaginal bleeding, unspecified: Secondary | ICD-10-CM

## 2020-01-27 DIAGNOSIS — Z8619 Personal history of other infectious and parasitic diseases: Secondary | ICD-10-CM | POA: Insufficient documentation

## 2020-01-27 DIAGNOSIS — N800 Endometriosis of uterus: Secondary | ICD-10-CM

## 2020-01-27 DIAGNOSIS — R109 Unspecified abdominal pain: Secondary | ICD-10-CM

## 2020-01-27 DIAGNOSIS — N8003 Adenomyosis of the uterus: Secondary | ICD-10-CM

## 2020-01-27 HISTORY — DX: Personal history of other infectious and parasitic diseases: Z86.19

## 2020-01-27 NOTE — Progress Notes (Signed)
Obstetrics and Gynecology Visit Return Patient Evaluation  Appointment Date: 01/27/2020  Primary Care Provider: [place], [place] for [facility]  Chief Complaint: AUB and cramping with bleeding   History of Present Illness:  Ellen Stone is a 34 y.o. G0 with above CC. PMHx significant for adenomyosis, h/o trich, thrombocytosis  Patient takes the provera daily and it keeps her from having unscheduled bleeding and pain; she has regular, monthly periods that last for about a week. When she comes off the provera she has bleeding and pain, which happened when she ran out of meds and she was awaiting a refill. She is currently asymptomatic.   She did not take the flagyl for trich yet b/c she is awaiting her partner to be treated.   Review of Systems:  as noted in the History of Present Illness.  Medications:  Yarethzi Branan. Dralle had no medications administered during this visit. Current Outpatient Medications  Medication Sig Dispense Refill  . acetaminophen (TYLENOL) 500 MG tablet Take 500 mg by mouth every 6 (six) hours as needed.    . ferrous sulfate 325 (65 FE) MG EC tablet Take 1 tablet (325 mg total) by mouth daily with breakfast. 30 tablet 3  . medroxyPROGESTERone (PROVERA) 10 MG tablet Take 1 tablet (10 mg total) by mouth 3 (three) times daily. As needed for pain and bleeding 90 tablet 3   No current facility-administered medications for this visit.    Allergies: has No Known Allergies.  Physical Exam:  BP 105/79   Pulse 94   Wt 169 lb 11.2 oz (77 kg)   BMI 23.67 kg/m  Body mass index is 23.67 kg/m. General appearance: Well nourished, well developed female in no acute distress.  Neuro/Psych:  Normal mood and affect.    Assessment: pt stable  Plan:  1. Adenomyosis D/w her that I definitely recommend treatment not only to potentially help with her s/s but also to prevent any ascending infection.  She is self-pay and options d/w her. Mirena  application as pt would like to do something that's not qday. I recommend she stay on the provera.   2. Abnormal uterine bleeding (AUB)  3. Abdominal pain, unspecified abdominal location  4. History of trichomoniasis   RTC: PRN  Durene Romans MD Attending Center for [facility])

## 2020-02-25 ENCOUNTER — Telehealth: Payer: Self-pay | Admitting: Hematology and Oncology

## 2020-02-25 NOTE — Telephone Encounter (Signed)
R/s 8/19 per provider pal. Could not leave msg. Will try to contact again

## 2020-03-03 ENCOUNTER — Ambulatory Visit: Payer: Self-pay | Admitting: Hematology and Oncology

## 2020-03-03 ENCOUNTER — Other Ambulatory Visit: Payer: Self-pay

## 2020-03-09 ENCOUNTER — Other Ambulatory Visit: Payer: Self-pay | Admitting: Hematology and Oncology

## 2020-03-09 DIAGNOSIS — D75839 Thrombocytosis, unspecified: Secondary | ICD-10-CM

## 2020-03-10 ENCOUNTER — Inpatient Hospital Stay: Payer: Self-pay | Attending: Hematology and Oncology

## 2020-03-10 ENCOUNTER — Other Ambulatory Visit: Payer: Self-pay

## 2020-03-10 ENCOUNTER — Inpatient Hospital Stay: Payer: Self-pay | Admitting: Hematology and Oncology

## 2020-03-10 DIAGNOSIS — D473 Essential (hemorrhagic) thrombocythemia: Secondary | ICD-10-CM | POA: Insufficient documentation

## 2020-03-10 DIAGNOSIS — D75839 Thrombocytosis, unspecified: Secondary | ICD-10-CM

## 2020-03-10 LAB — CBC WITH DIFFERENTIAL (CANCER CENTER ONLY)
Abs Immature Granulocytes: 0.05 10*3/uL (ref 0.00–0.07)
Basophils Absolute: 0 10*3/uL (ref 0.0–0.1)
Basophils Relative: 0 %
Eosinophils Absolute: 0.1 10*3/uL (ref 0.0–0.5)
Eosinophils Relative: 1 %
HCT: 33.1 % — ABNORMAL LOW (ref 36.0–46.0)
Hemoglobin: 11.4 g/dL — ABNORMAL LOW (ref 12.0–15.0)
Immature Granulocytes: 1 %
Lymphocytes Relative: 27 %
Lymphs Abs: 2.8 10*3/uL (ref 0.7–4.0)
MCH: 30.4 pg (ref 26.0–34.0)
MCHC: 34.4 g/dL (ref 30.0–36.0)
MCV: 88.3 fL (ref 80.0–100.0)
Monocytes Absolute: 1 10*3/uL (ref 0.1–1.0)
Monocytes Relative: 9 %
Neutro Abs: 6.2 10*3/uL (ref 1.7–7.7)
Neutrophils Relative %: 62 %
Platelet Count: 505 10*3/uL — ABNORMAL HIGH (ref 150–400)
RBC: 3.75 MIL/uL — ABNORMAL LOW (ref 3.87–5.11)
RDW: 18.6 % — ABNORMAL HIGH (ref 11.5–15.5)
WBC Count: 10.1 10*3/uL (ref 4.0–10.5)
nRBC: 0 % (ref 0.0–0.2)

## 2020-03-10 LAB — CMP (CANCER CENTER ONLY)
ALT: 7 U/L (ref 0–44)
AST: 12 U/L — ABNORMAL LOW (ref 15–41)
Albumin: 3.7 g/dL (ref 3.5–5.0)
Alkaline Phosphatase: 40 U/L (ref 38–126)
Anion gap: 6 (ref 5–15)
BUN: 8 mg/dL (ref 6–20)
CO2: 22 mmol/L (ref 22–32)
Calcium: 8.6 mg/dL — ABNORMAL LOW (ref 8.9–10.3)
Chloride: 108 mmol/L (ref 98–111)
Creatinine: 0.67 mg/dL (ref 0.44–1.00)
GFR, Est AFR Am: >60 mL/min (ref >60)
GFR, Estimated: >60 mL/min (ref >60)
Glucose, Bld: 86 mg/dL (ref 70–99)
Potassium: 3.5 mmol/L (ref 3.5–5.1)
Sodium: 136 mmol/L (ref 135–145)
Total Bilirubin: <0.2 mg/dL — ABNORMAL LOW (ref 0.3–1.2)
Total Protein: 6.3 g/dL — ABNORMAL LOW (ref 6.5–8.1)

## 2020-03-10 LAB — IRON AND TIBC
Iron: 32 ug/dL — ABNORMAL LOW (ref 41–142)
Saturation Ratios: 9 % — ABNORMAL LOW (ref 21–57)
TIBC: 355 ug/dL (ref 236–444)
UIBC: 323 ug/dL (ref 120–384)

## 2020-03-10 LAB — RETIC PANEL
Immature Retic Fract: 33.9 % — ABNORMAL HIGH (ref 2.3–15.9)
RBC.: 3.65 MIL/uL — ABNORMAL LOW (ref 3.87–5.11)
Retic Count, Absolute: 73.4 10*3/uL (ref 19.0–186.0)
Retic Ct Pct: 2 % (ref 0.4–3.1)
Reticulocyte Hemoglobin: 41.2 pg (ref >27.9)

## 2020-03-10 LAB — FERRITIN: Ferritin: 36 ng/mL (ref 11–307)

## 2020-03-11 ENCOUNTER — Telehealth: Payer: Self-pay | Admitting: Hematology and Oncology

## 2020-03-11 ENCOUNTER — Telehealth: Payer: Self-pay | Admitting: *Deleted

## 2020-03-11 NOTE — Telephone Encounter (Signed)
Attempted call to patient regarding recent lab results. No answer and pt does not have vm set up on her phone. Will try again next week.

## 2020-03-11 NOTE — Telephone Encounter (Signed)
-----   Message from Orson Slick, MD sent at 03/11/2020 10:25 AM EDT ----- Please let Mrs. Gendron know that her Hgb and iron levels are improving slowly. She should continue to take the iron pills as prescribed. If she is not tolerating/taking the medication we can schedule her for IV iron instead. We will plan to see her back in 3 months with labs (please schedule this)  ----- Message ----- From: Buel Ream, Lab In [place] Sent: 03/10/2020  11:21 AM EDT To: Orson Slick, MD

## 2020-03-11 NOTE — Telephone Encounter (Signed)
Scheduled apt per 8/27 sch msg - mailed reminder letter with appt date and time

## 2020-03-17 NOTE — Progress Notes (Signed)
Entered in error

## 2020-03-30 ENCOUNTER — Encounter: Payer: Self-pay | Admitting: Obstetrics and Gynecology

## 2020-03-30 ENCOUNTER — Ambulatory Visit: Payer: Self-pay | Admitting: Obstetrics and Gynecology

## 2020-03-30 NOTE — Progress Notes (Signed)
Patient did not keep her GYN appointment for 03/30/2020.  Durene Romans MD Attending Center for [employer] [facility])

## 2020-04-25 ENCOUNTER — Other Ambulatory Visit: Payer: Self-pay | Admitting: *Deleted

## 2020-04-25 NOTE — Telephone Encounter (Signed)
Ellen Stone left message today needing refill on Progesterone for AUB. She did not keep her last appt with Dr Ilda Basset on 03/30/20

## 2020-04-26 ENCOUNTER — Other Ambulatory Visit: Payer: Self-pay | Admitting: Obstetrics and Gynecology

## 2020-04-27 NOTE — Telephone Encounter (Signed)
I called the pharmacy and was informed that a new Rx was e-prescribed yesterday by Dr. Ilda Basset for #9 tablets. I stated that I thought he may have meant to send Rx for #90 tablets and would send a message to him regarding the question.

## 2020-05-10 ENCOUNTER — Other Ambulatory Visit: Payer: Self-pay

## 2020-05-10 MED ORDER — MEDROXYPROGESTERONE ACETATE 10 MG PO TABS: ORAL_TABLET | ORAL | 0 refills | Status: DC

## 2020-05-10 NOTE — Progress Notes (Signed)
Per Dr.Pickens ok to refill Provera for 1 month, she has a appointment coming up

## 2020-05-11 ENCOUNTER — Encounter: Payer: Self-pay | Admitting: Obstetrics and Gynecology

## 2020-05-11 ENCOUNTER — Other Ambulatory Visit: Payer: Self-pay

## 2020-05-11 ENCOUNTER — Ambulatory Visit (INDEPENDENT_AMBULATORY_CARE_PROVIDER_SITE_OTHER): Payer: Self-pay | Admitting: Obstetrics and Gynecology

## 2020-05-11 VITALS — BP 118/92 | HR 99 | Wt 175.6 lb

## 2020-05-11 DIAGNOSIS — N800 Endometriosis of uterus: Secondary | ICD-10-CM

## 2020-05-11 DIAGNOSIS — N8003 Adenomyosis of the uterus: Secondary | ICD-10-CM

## 2020-05-11 DIAGNOSIS — R7989 Other specified abnormal findings of blood chemistry: Secondary | ICD-10-CM

## 2020-05-11 DIAGNOSIS — N939 Abnormal uterine and vaginal bleeding, unspecified: Secondary | ICD-10-CM

## 2020-05-11 DIAGNOSIS — I10 Essential (primary) hypertension: Secondary | ICD-10-CM | POA: Insufficient documentation

## 2020-05-11 MED ORDER — MEDROXYPROGESTERONE ACETATE 10 MG PO TABS
ORAL_TABLET | ORAL | 2 refills | Status: DC
Start: 2020-05-11 — End: 2020-08-15

## 2020-05-11 NOTE — Progress Notes (Signed)
Obstetrics and Gynecology Established Patient Evaluation  Appointment Date: 05/11/2020  OBGYN Clinic: Center for [facility]  Primary Care Provider: Karle Plumber Stone  Chief Complaint: adenomyosis surveillance History of Present Illness: Ellen Stone is a 34 y.o. African-American G0 (Patient's last menstrual period was 05/05/2020.), seen for the above chief complaint. Her past medical history is significant for severe adenomyosis on MRI, AUB, HTN, thrombocytosis, tobacco abuse, h/o trich, low TSH with normal TFTs  Patient is self pay and has been on provera since her new patient visit in march 2021. She uses it 2-3x/day and has a monthly period that lasts 5-7 days; sometimes she will get a few days of spotting pre or post period.  She ran out about a month ago and before a refill was sent had a period for two weeks. LMP ended yesterday  Review of Systems: Pertinent items are noted in HPI.   Patient Active Problem List   Diagnosis Date Noted  . Hypertension   . History of trichomoniasis 01/27/2020  . Adenomyosis 09/29/2019  . Low TSH level 09/29/2019  . Thrombocytosis 09/17/2019  . Vitamin D deficiency 10/20/2015  . Hyperphosphatemia 10/20/2015    Past Medical History:  Past Medical History:  Diagnosis Date  . Chronic bronchitis ([place])   . Cocaine abuse ([place])   . Hyperphosphatemia 10/20/2015  . Hypertension   . Low TSH level 09/29/2019  . Recurrent UTI (urinary tract infection)    "none lately; since I started drinking more water" (10/18/2015)  . Vitamin D deficiency 10/20/2015    Past Surgical History:  Past Surgical History:  Procedure Laterality Date  . FRACTURE SURGERY    . ORIF PROXIMAL TIBIAL PLATEAU FRACTURE Left 10/18/2015  . ORIF TIBIA PLATEAU Left 10/18/2015   Procedure: OPEN REDUCTION INTERNAL FIXATION (ORIF) LEFT TIBIAL PLATEAU;  Surgeon: Ellen Bay Port, MD;  Location: [place];  Service: Orthopedics;  Laterality: Left;    Past Obstetrical  History:  OB History  Gravida Para Term Preterm AB Living  0 0 0 0 0 0  SAB TAB Ectopic Multiple Live Births  0 0 0 0 0    Past Gynecological History: As per HPI. History of Pap Smear(s): Yes.   Last pap 09/2019, which was negative  Social History:  Social History   Socioeconomic History  . Marital status: Married    Spouse name: Not on file  . Number of children: Not on file  . Years of education: Not on file  . Highest education level: Not on file  Occupational History  . Not on file  Tobacco Use  . Smoking status: Current Every Day Smoker    Packs/day: 1.00    Years: 10.00    Pack years: 10.00    Types: Cigarettes  . Smokeless tobacco: Never Used  Vaping Use  . Vaping Use: Never used  Substance and Sexual Activity  . Alcohol use: Yes    Comment: 10/18/2015 "down to maybe 1 beer q 2 weeks or so"  . Drug use: Yes    Types: Cocaine, Marijuana    Comment: 10/18/2015 "quit cocaine recently"  . Sexual activity: Not Currently    Birth control/protection: None  Other Topics Concern  . Not on file  Social History Narrative  . Not on file   Social Determinants of Health   Financial Resource Strain:   . Difficulty of Paying Living Expenses: Not on file  Food Insecurity:   . Worried About Charity fundraiser in the Last Year:  Not on file  . Ran Out of Food in the Last Year: Not on file  Transportation Needs:   . Lack of Transportation (Medical): Not on file  . Lack of Transportation (Non-Medical): Not on file  Physical Activity:   . Days of Exercise per Week: Not on file  . Minutes of Exercise per Session: Not on file  Stress:   . Feeling of Stress : Not on file  Social Connections:   . Frequency of Communication with Friends and Family: Not on file  . Frequency of Social Gatherings with Friends and Family: Not on file  . Attends Religious Services: Not on file  . Active Member of Clubs or Organizations: Not on file  . Attends Archivist Meetings: Not on  file  . Marital Status: Not on file  Intimate Partner Violence:   . Fear of Current or Ex-Partner: Not on file  . Emotionally Abused: Not on file  . Physically Abused: Not on file  . Sexually Abused: Not on file    Family History:  Family History  Problem Relation Age of Onset  . Clotting disorder Mother   . Heart Problems Father   . Clotting disorder Maternal Uncle     Medications Ellen Stone had no medications administered during this visit. Current Outpatient Medications  Medication Sig Dispense Refill  . acetaminophen (TYLENOL) 500 MG tablet Take 500 mg by mouth every 6 (six) hours as needed.    . ferrous sulfate 325 (65 FE) MG EC tablet Take 1 tablet (325 mg total) by mouth daily with breakfast. 30 tablet 3  . medroxyPROGESTERone (PROVERA) 10 MG tablet TAKE 1 TABLET BY MOUTH THREE TIMES DAILY AS NEEDED FOR PAIN AND BLEEDING. 90 tablet 2   No current facility-administered medications for this visit.    Allergies Patient has no known allergies.   Physical Exam:  BP (!) 118/92   Pulse 99   Wt 175 lb 9.6 oz (79.7 kg)   LMP 05/05/2020   BMI 24.49 kg/m  Body mass index is 24.49 kg/m. General appearance: Well nourished, well developed female in no acute distress.  Neck:  Supple, normal appearance, and no thyromegaly  Cardiovascular: normal s1 and s2.  No murmurs, rubs or gallops. Respiratory:  Clear to auscultation bilateral. Normal respiratory effort Abdomen: positive bowel sounds and no masses, hernias; diffusely non tender to palpation, non distended Neuro/Psych:  Normal mood and affect.  Skin:  Warm and dry.   Laboratory: no new labs  Radiology: no new imaging  Assessment: pt stable  Plan:  1. Adenomyosis D/w her again re: options. Pt has Mirena app from last time to turn in and also has medicaid app; I also told her about family planning medicaid and potentially getting a Mirena or depo through the HD; she used depo for four years in HS and was  amenorrheic and did well on it but she weight up to 230lbs. If she gets coverage I told her I'd prefer Mirena but Slynd is also an option and could try and use it continuously. I also d/w her that unfortunately surgical options are limited to hysterectomy, which I don't recommend without trying a better medical option. Provera is okay to continue to now but unknown risk with being on it chronically at that dose and potential risk of breast cancer, etc.   2. Abnormal uterine bleeding (AUB)  3. Thrombocytosis Followed by Heme. Pt reminded of appointment in late November  RTC 3 months  Jmari Pelc,  Brooke Bonito MD Attending Center for [facility])

## 2020-05-11 NOTE — Patient Instructions (Signed)
You have adenomyosis  https://www.mayoclinic.org/diseases-conditions/adenomyosis/symptoms-causes/syc-20369138#:~:text=Adenomyosis%20(ad%2Duh%2Dno,bleeding%20%E2%80%94%20during%20each%37menstrual%20cycle.   Apply for regular medicaid Apply for family planning medicaid Look into the heath department about a Mirena or a depo provera shot Bring the Mirena application to the clinic

## 2020-06-13 ENCOUNTER — Other Ambulatory Visit: Payer: Self-pay | Admitting: Hematology and Oncology

## 2020-06-13 ENCOUNTER — Encounter: Payer: Self-pay | Admitting: Hematology and Oncology

## 2020-06-13 ENCOUNTER — Other Ambulatory Visit: Payer: Self-pay

## 2020-06-13 ENCOUNTER — Inpatient Hospital Stay: Payer: Self-pay | Attending: Hematology and Oncology | Admitting: Hematology and Oncology

## 2020-06-13 ENCOUNTER — Inpatient Hospital Stay: Payer: Self-pay

## 2020-06-13 VITALS — BP 118/87 | HR 85 | Temp 98.3°F | Resp 16 | Ht 71.0 in | Wt 186.7 lb

## 2020-06-13 DIAGNOSIS — K5900 Constipation, unspecified: Secondary | ICD-10-CM | POA: Insufficient documentation

## 2020-06-13 DIAGNOSIS — N920 Excessive and frequent menstruation with regular cycle: Secondary | ICD-10-CM | POA: Insufficient documentation

## 2020-06-13 DIAGNOSIS — Z793 Long term (current) use of hormonal contraceptives: Secondary | ICD-10-CM | POA: Insufficient documentation

## 2020-06-13 DIAGNOSIS — D500 Iron deficiency anemia secondary to blood loss (chronic): Secondary | ICD-10-CM | POA: Insufficient documentation

## 2020-06-13 DIAGNOSIS — F17210 Nicotine dependence, cigarettes, uncomplicated: Secondary | ICD-10-CM | POA: Insufficient documentation

## 2020-06-13 DIAGNOSIS — Z79899 Other long term (current) drug therapy: Secondary | ICD-10-CM | POA: Insufficient documentation

## 2020-06-13 DIAGNOSIS — Z87440 Personal history of urinary (tract) infections: Secondary | ICD-10-CM | POA: Insufficient documentation

## 2020-06-13 DIAGNOSIS — I10 Essential (primary) hypertension: Secondary | ICD-10-CM | POA: Insufficient documentation

## 2020-06-13 DIAGNOSIS — D75839 Thrombocytosis, unspecified: Secondary | ICD-10-CM | POA: Insufficient documentation

## 2020-06-13 DIAGNOSIS — D72829 Elevated white blood cell count, unspecified: Secondary | ICD-10-CM | POA: Insufficient documentation

## 2020-06-13 LAB — CBC WITH DIFFERENTIAL (CANCER CENTER ONLY)
Abs Immature Granulocytes: 0.03 10*3/uL (ref 0.00–0.07)
Basophils Absolute: 0.1 10*3/uL (ref 0.0–0.1)
Basophils Relative: 1 %
Eosinophils Absolute: 0.2 10*3/uL (ref 0.0–0.5)
Eosinophils Relative: 2 %
HCT: 41.7 % (ref 36.0–46.0)
Hemoglobin: 13.5 g/dL (ref 12.0–15.0)
Immature Granulocytes: 0 %
Lymphocytes Relative: 28 %
Lymphs Abs: 2.7 10*3/uL (ref 0.7–4.0)
MCH: 30.8 pg (ref 26.0–34.0)
MCHC: 32.4 g/dL (ref 30.0–36.0)
MCV: 95.2 fL (ref 80.0–100.0)
Monocytes Absolute: 0.7 10*3/uL (ref 0.1–1.0)
Monocytes Relative: 7 %
Neutro Abs: 6.2 10*3/uL (ref 1.7–7.7)
Neutrophils Relative %: 62 %
Platelet Count: 427 10*3/uL — ABNORMAL HIGH (ref 150–400)
RBC: 4.38 MIL/uL (ref 3.87–5.11)
RDW: 14.8 % (ref 11.5–15.5)
WBC Count: 9.8 10*3/uL (ref 4.0–10.5)
nRBC: 0 % (ref 0.0–0.2)

## 2020-06-13 LAB — RETIC PANEL
Immature Retic Fract: 16.6 % — ABNORMAL HIGH (ref 2.3–15.9)
RBC.: 4.37 MIL/uL (ref 3.87–5.11)
Retic Count, Absolute: 60.7 10*3/uL (ref 19.0–186.0)
Retic Ct Pct: 1.4 % (ref 0.4–3.1)
Reticulocyte Hemoglobin: 36.2 pg (ref >27.9)

## 2020-06-13 LAB — CMP (CANCER CENTER ONLY)
ALT: 17 U/L (ref 0–44)
AST: 15 U/L (ref 15–41)
Albumin: 3.8 g/dL (ref 3.5–5.0)
Alkaline Phosphatase: 50 U/L (ref 38–126)
Anion gap: 8 (ref 5–15)
BUN: 9 mg/dL (ref 6–20)
CO2: 25 mmol/L (ref 22–32)
Calcium: 9.3 mg/dL (ref 8.9–10.3)
Chloride: 108 mmol/L (ref 98–111)
Creatinine: 0.87 mg/dL (ref 0.44–1.00)
GFR, Estimated: >60 mL/min (ref >60)
Glucose, Bld: 88 mg/dL (ref 70–99)
Potassium: 4.3 mmol/L (ref 3.5–5.1)
Sodium: 141 mmol/L (ref 135–145)
Total Bilirubin: 0.2 mg/dL — ABNORMAL LOW (ref 0.3–1.2)
Total Protein: 7.5 g/dL (ref 6.5–8.1)

## 2020-06-13 LAB — FERRITIN: Ferritin: 13 ng/mL (ref 11–307)

## 2020-06-13 LAB — IRON AND TIBC
Iron: 74 ug/dL (ref 41–142)
Saturation Ratios: 16 % — ABNORMAL LOW (ref 21–57)
TIBC: 460 ug/dL — ABNORMAL HIGH (ref 236–444)
UIBC: 386 ug/dL — ABNORMAL HIGH (ref 120–384)

## 2020-06-13 NOTE — Progress Notes (Signed)
[facility] [phone]   [phone]  PROGRESS NOTE  Patient Care Team: Ladell Pier, MD as PCP - General (Internal Medicine)  Hematological/Oncological History # Thrombocytosis # Leukocytosis #Iron Deficiency Anemia 2/2 to GYN Bleeding 1) 11/26/2010: WBC 16.3, Hgb 15.0, MCV 88.5, Plt 207. First CBC on record 2) 10/13/2015: WBC 8.7, Hgb 12.2, MCV 88.8, Plt 442 3) 07/11/2019: WBC 15.5, Hgb 13.7, MCV 91.7, Plt 842 4) 08/18/2019: WBC 12.5, Hgb 13.5, MCV 90.5, Plt 842 5) 09/17/2019: WBC 10.3, Hgb 11.4, MCV 89, Plt 656 6) 12/03/2019: establish care with Dr. Lorenso Courier  7) 03/10/2020: WBC 10.1, Hgb 11.4, MCV 88.3, Plt 505 8) 06/13/2020: WBC 9.8, Hgb 13.5, MCV 95.2, Plt 427  Interval History:  Ellen Stone 34 y.o. female with medical history significant for iron deficiency anemia presents for a follow up visit. The patient's last visit was on 12/03/2019. In the interim since the last visit she has been sporadically taking iron pills and had interval labs in August 2021 which showed improving Hgb and thrombocytosis.  On exam today Ellen Stone reports she has been well in the interim since our last visit.  She reports that she has had a boost in her energy and that she requires it because she has been looking for a new job.  She notes the iron pills do cause "a little constipation" but that she thinks mostly constipation is due to her eating junk food.  She reports that she continues to have heavier menstrual cycles and that her last period lasted proximally 6 to 7 days with some spotting afterwards.  She is also had considerable weight gain since our last visit and is now up to 186 pounds.  Misunderstood the instructions about taking the iron and was taking it only on mornings where she ate breakfast and was unaware that she could take it with other meals throughout the day.  We instructed on this and she stated that she will not try to take the pills 7 days a week with  a source of vitamin C.  She currently denies having any issues with fevers, chills, sweats, nausea, vomiting or diarrhea.  She is willing and able to continue with the p.o. iron therapy at this time.  A full 10 point ROS is listed below.  MEDICAL HISTORY:  Past Medical History:  Diagnosis Date  . Chronic bronchitis ([place])   . Cocaine abuse ([place])   . Hyperphosphatemia 10/20/2015  . Hypertension   . Low TSH level 09/29/2019  . Recurrent UTI (urinary tract infection)    "none lately; since I started drinking more water" (10/18/2015)  . Vitamin D deficiency 10/20/2015    SURGICAL HISTORY: Past Surgical History:  Procedure Laterality Date  . FRACTURE SURGERY    . ORIF PROXIMAL TIBIAL PLATEAU FRACTURE Left 10/18/2015  . ORIF TIBIA PLATEAU Left 10/18/2015   Procedure: OPEN REDUCTION INTERNAL FIXATION (ORIF) LEFT TIBIAL PLATEAU;  Surgeon: Altamese Murdo, MD;  Location: [place];  Service: Orthopedics;  Laterality: Left;    SOCIAL HISTORY: Social History   Socioeconomic History  . Marital status: Married    Spouse name: Not on file  . Number of children: Not on file  . Years of education: Not on file  . Highest education level: Not on file  Occupational History  . Not on file  Tobacco Use  . Smoking status: Current Every Day Smoker    Packs/day: 1.00    Years: 10.00    Pack years: 10.00  Types: Cigarettes  . Smokeless tobacco: Never Used  Vaping Use  . Vaping Use: Never used  Substance and Sexual Activity  . Alcohol use: Yes    Comment: 10/18/2015 "down to maybe 1 beer q 2 weeks or so"  . Drug use: Yes    Types: Cocaine, Marijuana    Comment: 10/18/2015 "quit cocaine recently"  . Sexual activity: Not Currently    Birth control/protection: None  Other Topics Concern  . Not on file  Social History Narrative  . Not on file   Social Determinants of Health   Financial Resource Strain:   . Difficulty of Paying Living Expenses: Not on file  Food Insecurity:   . Worried About Ship broker in the Last Year: Not on file  . Ran Out of Food in the Last Year: Not on file  Transportation Needs:   . Lack of Transportation (Medical): Not on file  . Lack of Transportation (Non-Medical): Not on file  Physical Activity:   . Days of Exercise per Week: Not on file  . Minutes of Exercise per Session: Not on file  Stress:   . Feeling of Stress : Not on file  Social Connections:   . Frequency of Communication with Friends and Family: Not on file  . Frequency of Social Gatherings with Friends and Family: Not on file  . Attends Religious Services: Not on file  . Active Member of Clubs or Organizations: Not on file  . Attends Archivist Meetings: Not on file  . Marital Status: Not on file  Intimate Partner Violence:   . Fear of Current or Ex-Partner: Not on file  . Emotionally Abused: Not on file  . Physically Abused: Not on file  . Sexually Abused: Not on file    FAMILY HISTORY: Family History  Problem Relation Age of Onset  . Clotting disorder Mother   . Heart Problems Father   . Clotting disorder Maternal Uncle     ALLERGIES:  has No Known Allergies.  MEDICATIONS:  Current Outpatient Medications  Medication Sig Dispense Refill  . acetaminophen (TYLENOL) 500 MG tablet Take 500 mg by mouth every 6 (six) hours as needed.    . ferrous sulfate 325 (65 FE) MG EC tablet Take 1 tablet (325 mg total) by mouth daily with breakfast. 30 tablet 3  . medroxyPROGESTERone (PROVERA) 10 MG tablet TAKE 1 TABLET BY MOUTH THREE TIMES DAILY AS NEEDED FOR PAIN AND BLEEDING. 90 tablet 2   No current facility-administered medications for this visit.    REVIEW OF SYSTEMS:   Constitutional: ( - ) fevers, ( - )  chills , ( - ) night sweats Eyes: ( - ) blurriness of vision, ( - ) double vision, ( - ) watery eyes Ears, nose, mouth, throat, and face: ( - ) mucositis, ( - ) sore throat Respiratory: ( - ) cough, ( - ) dyspnea, ( - ) wheezes Cardiovascular: ( - ) palpitation, ( - )  chest discomfort, ( - ) lower extremity swelling Gastrointestinal:  ( - ) nausea, ( - ) heartburn, ( - ) change in bowel habits Skin: ( - ) abnormal skin rashes Lymphatics: ( - ) new lymphadenopathy, ( - ) easy bruising Neurological: ( - ) numbness, ( - ) tingling, ( - ) new weaknesses Behavioral/Psych: ( - ) mood change, ( - ) new changes  All other systems were reviewed with the patient and are negative.  PHYSICAL EXAMINATION: ECOG PERFORMANCE STATUS: 0 -  Asymptomatic  Vitals:   06/13/20 1541  BP: 118/87  Pulse: 85  Resp: 16  Temp: 98.3 F (36.8 C)  SpO2: 100%   Filed Weights   06/13/20 1541  Weight: 186 lb 11.2 oz (84.7 kg)    GENERAL: well appearing young [place] American female. alert, no distress and comfortable SKIN: skin color, texture, turgor are normal, no rashes or significant lesions EYES: conjunctiva are pink and non-injected, sclera clear LUNGS: clear to auscultation and percussion with normal breathing effort HEART: regular rate & rhythm and no murmurs and no lower extremity edema Musculoskeletal: no cyanosis of digits and no clubbing  PSYCH: alert & oriented x 3, fluent speech NEURO: no focal motor/sensory deficits  LABORATORY DATA:  I have reviewed the data as listed CBC Latest Ref Rng & Units 06/13/2020 03/10/2020 12/03/2019  WBC 4.0 - 10.5 K/uL 9.8 10.1 10.2  Hemoglobin 12.0 - 15.0 g/dL 13.5 11.4(L) 10.5(L)  Hematocrit 36 - 46 % 41.7 33.1(L) 32.6(L)  Platelets 150 - 400 K/uL 427(H) 505(H) 546(H)    CMP Latest Ref Rng & Units 06/13/2020 03/10/2020 12/03/2019  Glucose 70 - 99 mg/dL 88 86 80  BUN 6 - 20 mg/dL 9 8 7   Creatinine 0.44 - 1.00 mg/dL 0.87 0.67 0.70  Sodium 135 - 145 mmol/L 141 136 139  Potassium 3.5 - 5.1 mmol/L 4.3 3.5 4.2  Chloride 98 - 111 mmol/L 108 108 108  CO2 22 - 32 mmol/L 25 22 24   Calcium 8.9 - 10.3 mg/dL 9.3 8.6(L) 8.9  Total Protein 6.5 - 8.1 g/dL 7.5 6.3(L) 7.6  Total Bilirubin 0.3 - 1.2 mg/dL 0.2(L) <0.2(L) <0.2(L)  Alkaline  Phos 38 - 126 U/L 50 40 41  AST 15 - 41 U/L 15 12(L) 14(L)  ALT 0 - 44 U/L 17 7 11     No results found for: MPROTEIN No results found for: KPAFRELGTCHN, LAMBDASER, KAPLAMBRATIO   RADIOGRAPHIC STUDIES: No results found.  ASSESSMENT & PLAN Ellen Stone 34 y.o. female with medical history significant for iron deficiency anemia presents for a follow up visit.  I reviewed the labs, the records, discussion with the patient the findings are consistent with improvement in thrombocytosis and anemia with iron pills.  We recommend that she take these daily from this point forward as unfortunately she was taking them sporadically only on mornings which she was waking up early enough to take them with breakfast.  She is not having any ill effects from the p.o. iron and therefore can continue this and there is no need for consideration of IV iron at this time.  We will plan to see her back in approximately 3 months in order to assure that her iron levels continue to improve.  # Thrombocytosis, chronic. Resolving.  # Leukocytosis, resolved #Iron Deficiency Anemia 2/2 to GYN Blood loss --will order CBC, CMP retic panel, and iron studies with ferritin --thrombocytosis is improving, leukocytosis has resolved. Etiology of leukocytosis has been unclear.  --MPN workup with BCR/ABL FISH and JAK2 w/ reflex was negative. Leukocytosis resolved.  --continue PO ferrous sulfate 325mg  PO daily with a source of vitamin C.  --encourage patient to connect with OB/GYN if menstrual bleeding continues to cause anemia despite PO iron therapy.  --RTC in 3 months to continue monitoring iron levels.   No orders of the defined types were placed in this encounter.   All questions were answered. The patient knows to call the clinic with any problems, questions or concerns.  A total of more  than 30 minutes were spent on this encounter and over half of that time was spent on counseling and coordination of care as outlined above.    Ledell Peoples, MD Department of Hematology/Oncology [place] at [facility] Phone: [phone] Pager: [phone] Email: [email or web address]  06/13/2020 4:15 PM

## 2020-06-14 ENCOUNTER — Telehealth: Payer: Self-pay | Admitting: Hematology and Oncology

## 2020-06-14 NOTE — Telephone Encounter (Signed)
Scheduled per los. Called and left msg. Mailed printout  °

## 2020-06-15 ENCOUNTER — Telehealth: Payer: Self-pay | Admitting: *Deleted

## 2020-06-15 NOTE — Telephone Encounter (Signed)
-----   Message from Orson Slick, MD sent at 06/15/2020 11:41 AM EST ----- Please let Ellen Stone know that her iron levels are improving, but still low. Encourage her to continue taking PO iron therapy. We will see her back in 3 months to assure her levels continue to improve.  ----- Message ----- From: Interface, Lab In [place] Sent: 06/13/2020   2:54 PM EST To: Orson Slick, MD

## 2020-06-15 NOTE — Telephone Encounter (Signed)
Notified of message below. Verbalized understanding 

## 2020-08-15 ENCOUNTER — Ambulatory Visit (INDEPENDENT_AMBULATORY_CARE_PROVIDER_SITE_OTHER): Payer: Self-pay | Admitting: Obstetrics and Gynecology

## 2020-08-15 ENCOUNTER — Encounter: Payer: Self-pay | Admitting: Obstetrics and Gynecology

## 2020-08-15 ENCOUNTER — Other Ambulatory Visit: Payer: Self-pay

## 2020-08-15 ENCOUNTER — Ambulatory Visit: Payer: Self-pay | Admitting: Obstetrics and Gynecology

## 2020-08-15 VITALS — BP 125/94 | HR 77 | Ht 71.0 in | Wt 196.0 lb

## 2020-08-15 DIAGNOSIS — N800 Endometriosis of uterus: Secondary | ICD-10-CM

## 2020-08-15 DIAGNOSIS — N8003 Adenomyosis of the uterus: Secondary | ICD-10-CM

## 2020-08-15 MED ORDER — MEDROXYPROGESTERONE ACETATE 10 MG PO TABS: 20.0000 mg | ORAL_TABLET | Freq: Every day | ORAL | 3 refills | Status: DC

## 2020-08-15 NOTE — Progress Notes (Signed)
Ran out a week ago.    19th, close to a month. Not too bad  Two pills daily  Obstetrics and Gynecology Visit Return Patient Evaluation  Appointment Date: 08/15/2020  Primary Care Provider: [facility], [place] for [facility]  Chief Complaint: follow up adenomyosis  History of Present Illness:  Ellen Stone is a 35 y.o. P0 with above CC. Her past medical history is significant for severe adenomyosis on MRI, AUB, HTN, thrombocytosis, tobacco abuse, h/o trich, low TSH with normal TFTs  Patient last seen by me in late October 2021 and plan was to continue on po provera while patient applies for financial assistance, fills out Mirena IUD assistance forms.  Patient states she takes provera 20mg  po qday and that gives her a monthly period (around the 19th) and lasts for approximately 5 days and not too heavy or painful. She ran out a week ago of her provera and she had a period and that was heavy and painful.   She has not filled out financial assistance programs applications and forms; pt is self pay  Review of Systems:  as noted in the History of Present Illness.   Patient Active Problem List   Diagnosis Date Noted  . Hypertension   . History of trichomoniasis 01/27/2020  . Adenomyosis 09/29/2019  . Low TSH level 09/29/2019  . Thrombocytosis 09/17/2019  . Vitamin D deficiency 10/20/2015  . Hyperphosphatemia 10/20/2015   Medications:  Solveig Fangman. [place] had no medications administered during this visit. Current Outpatient Medications  Medication Sig Dispense Refill  . acetaminophen (TYLENOL) 500 MG tablet Take 500 mg by mouth every 6 (six) hours as needed.    . ferrous sulfate 325 (65 FE) MG EC tablet Take 1 tablet (325 mg total) by mouth daily with breakfast. 30 tablet 3  . medroxyPROGESTERone (PROVERA) 10 MG tablet TAKE 1 TABLET BY MOUTH THREE TIMES DAILY AS NEEDED FOR PAIN AND BLEEDING. 90 tablet 2   No current  facility-administered medications for this visit.    Allergies: has No Known Allergies.  Physical Exam:  BP (!) 125/94   Pulse 77   Ht 5\' 11"  (1.803 m)   Wt 196 lb (88.9 kg)   LMP 08/14/2020 (Exact Date)   BMI 27.34 kg/m  Body mass index is 27.34 kg/m. General appearance: Well nourished, well developed female in no acute distress.  Abdomen: diffusely non tender to palpation, non distended, and no masses, hernias Neuro/Psych:  Normal mood and affect.     Assessment: pt stable  Plan: Continue with po provera; applications for mirena and liletta given as well as resources for applying for medicaid  RTC: 6 months  Durene Romans MD Attending Center for [employer] manager)

## 2020-09-12 ENCOUNTER — Other Ambulatory Visit: Payer: Self-pay | Admitting: Hematology and Oncology

## 2020-09-12 ENCOUNTER — Inpatient Hospital Stay: Payer: Self-pay | Admitting: Hematology and Oncology

## 2020-09-12 ENCOUNTER — Inpatient Hospital Stay: Payer: Self-pay

## 2020-09-12 DIAGNOSIS — D500 Iron deficiency anemia secondary to blood loss (chronic): Secondary | ICD-10-CM

## 2020-09-14 ENCOUNTER — Telehealth: Payer: Self-pay | Admitting: Hematology and Oncology

## 2020-09-14 NOTE — Telephone Encounter (Signed)
Rescheduled appointment per 03/01 schedule messages. Attempted to contact patient with new appointment time. Voicemail was not set up. Will mail appointment calender with new appointment date and times.

## 2020-09-28 ENCOUNTER — Other Ambulatory Visit: Payer: Self-pay

## 2020-09-28 ENCOUNTER — Inpatient Hospital Stay (HOSPITAL_BASED_OUTPATIENT_CLINIC_OR_DEPARTMENT_OTHER): Payer: Self-pay | Admitting: Hematology and Oncology

## 2020-09-28 ENCOUNTER — Inpatient Hospital Stay: Payer: Self-pay | Attending: Hematology and Oncology

## 2020-09-28 VITALS — BP 106/74 | HR 81 | Temp 97.5°F | Resp 17 | Ht 71.0 in | Wt 203.1 lb

## 2020-09-28 DIAGNOSIS — D500 Iron deficiency anemia secondary to blood loss (chronic): Secondary | ICD-10-CM

## 2020-09-28 DIAGNOSIS — Z793 Long term (current) use of hormonal contraceptives: Secondary | ICD-10-CM | POA: Insufficient documentation

## 2020-09-28 DIAGNOSIS — Z79899 Other long term (current) drug therapy: Secondary | ICD-10-CM | POA: Insufficient documentation

## 2020-09-28 DIAGNOSIS — D75839 Thrombocytosis, unspecified: Secondary | ICD-10-CM

## 2020-09-28 DIAGNOSIS — I10 Essential (primary) hypertension: Secondary | ICD-10-CM | POA: Insufficient documentation

## 2020-09-28 DIAGNOSIS — F17210 Nicotine dependence, cigarettes, uncomplicated: Secondary | ICD-10-CM | POA: Insufficient documentation

## 2020-09-28 DIAGNOSIS — Z8249 Family history of ischemic heart disease and other diseases of the circulatory system: Secondary | ICD-10-CM | POA: Insufficient documentation

## 2020-09-28 DIAGNOSIS — D72829 Elevated white blood cell count, unspecified: Secondary | ICD-10-CM | POA: Insufficient documentation

## 2020-09-28 DIAGNOSIS — N920 Excessive and frequent menstruation with regular cycle: Secondary | ICD-10-CM | POA: Insufficient documentation

## 2020-09-28 LAB — CBC WITH DIFFERENTIAL (CANCER CENTER ONLY)
Abs Immature Granulocytes: 0.04 10*3/uL (ref 0.00–0.07)
Basophils Absolute: 0 10*3/uL (ref 0.0–0.1)
Basophils Relative: 0 %
Eosinophils Absolute: 0.1 10*3/uL (ref 0.0–0.5)
Eosinophils Relative: 1 %
HCT: 39.8 % (ref 36.0–46.0)
Hemoglobin: 13.2 g/dL (ref 12.0–15.0)
Immature Granulocytes: 0 %
Lymphocytes Relative: 26 %
Lymphs Abs: 2.9 10*3/uL (ref 0.7–4.0)
MCH: 31.8 pg (ref 26.0–34.0)
MCHC: 33.2 g/dL (ref 30.0–36.0)
MCV: 95.9 fL (ref 80.0–100.0)
Monocytes Absolute: 0.9 10*3/uL (ref 0.1–1.0)
Monocytes Relative: 8 %
Neutro Abs: 7 10*3/uL (ref 1.7–7.7)
Neutrophils Relative %: 65 %
Platelet Count: 374 10*3/uL (ref 150–400)
RBC: 4.15 MIL/uL (ref 3.87–5.11)
RDW: 14.5 % (ref 11.5–15.5)
WBC Count: 11 10*3/uL — ABNORMAL HIGH (ref 4.0–10.5)
nRBC: 0 % (ref 0.0–0.2)

## 2020-09-28 LAB — RETIC PANEL
Immature Retic Fract: 15 % (ref 2.3–15.9)
RBC.: 4.07 MIL/uL (ref 3.87–5.11)
Retic Count, Absolute: 63.5 10*3/uL (ref 19.0–186.0)
Retic Ct Pct: 1.6 % (ref 0.4–3.1)
Reticulocyte Hemoglobin: 35.4 pg (ref >27.9)

## 2020-09-28 LAB — CMP (CANCER CENTER ONLY)
ALT: 10 U/L (ref 0–44)
AST: 13 U/L — ABNORMAL LOW (ref 15–41)
Albumin: 4.1 g/dL (ref 3.5–5.0)
Alkaline Phosphatase: 45 U/L (ref 38–126)
Anion gap: 4 — ABNORMAL LOW (ref 5–15)
BUN: 9 mg/dL (ref 6–20)
CO2: 24 mmol/L (ref 22–32)
Calcium: 8.8 mg/dL — ABNORMAL LOW (ref 8.9–10.3)
Chloride: 108 mmol/L (ref 98–111)
Creatinine: 0.82 mg/dL (ref 0.44–1.00)
GFR, Estimated: >60 mL/min (ref >60)
Glucose, Bld: 58 mg/dL — ABNORMAL LOW (ref 70–99)
Potassium: 4.3 mmol/L (ref 3.5–5.1)
Sodium: 136 mmol/L (ref 135–145)
Total Bilirubin: 0.2 mg/dL — ABNORMAL LOW (ref 0.3–1.2)
Total Protein: 7.5 g/dL (ref 6.5–8.1)

## 2020-09-28 NOTE — Progress Notes (Signed)
[facility] Telephone:([phone]   Fax:([phone]  PROGRESS NOTE  Patient Care Team: Ladell Pier, MD as PCP - General (Internal Medicine)  Hematological/Oncological History # Thrombocytosis # Leukocytosis #Iron Deficiency Anemia 2/2 to GYN Bleeding 1) 11/26/2010: WBC 16.3, Hgb 15.0, MCV 88.5, Plt 207. First CBC on record 2) 10/13/2015: WBC 8.7, Hgb 12.2, MCV 88.8, Plt 442 3) 07/11/2019: WBC 15.5, Hgb 13.7, MCV 91.7, Plt 842 4) 08/18/2019: WBC 12.5, Hgb 13.5, MCV 90.5, Plt 842 5) 09/17/2019: WBC 10.3, Hgb 11.4, MCV 89, Plt 656 6) 12/03/2019: establish care with Dr. Lorenso Courier  7) 03/10/2020: WBC 10.1, Hgb 11.4, MCV 88.3, Plt 505 8) 06/13/2020: WBC 9.8, Hgb 13.5, MCV 95.2, Plt 427 9) 09/28/2020: WBC 11.0, Hgb 13.2, MCV 95.9, Plt 374  Interval History:  Ellen Stone 35 y.o. female with medical history significant for iron deficiency anemia presents for a follow up visit. The patient's last visit was on 06/13/2020. In the interim since the last visit she is had no major changes in her health.  On exam today Ellen Stone reports she was started on Provera shots and it "works quite well".  She notes that she is not as much pain with her menstrual cycles and that her last 2 have not been as heavy.  She reports no other overt sources of bleeding since her last visit.  She currently denies any shortness of breath or fatigue.  She also reports that she is eating red meat and trying to increase her overall iron intake.  He is currently taking ferrous sulfate 325 mg p.o. daily.  She currently denies having any issues with fevers, chills, sweats, nausea, vomiting or diarrhea.  She is willing and able to continue with the p.o. iron therapy at this time.  A full 10 point ROS is listed below.  MEDICAL HISTORY:  Past Medical History:  Diagnosis Date  . Chronic bronchitis ([place])   . Cocaine abuse ([place])   . Hyperphosphatemia 10/20/2015  . Hypertension   . Low TSH level 09/29/2019  .  Recurrent UTI (urinary tract infection)    "none lately; since I started drinking more water" (10/18/2015)  . Vitamin D deficiency 10/20/2015    SURGICAL HISTORY: Past Surgical History:  Procedure Laterality Date  . FRACTURE SURGERY    . ORIF PROXIMAL TIBIAL PLATEAU FRACTURE Left 10/18/2015  . ORIF TIBIA PLATEAU Left 10/18/2015   Procedure: OPEN REDUCTION INTERNAL FIXATION (ORIF) LEFT TIBIAL PLATEAU;  Surgeon: Altamese [place], MD;  Location: [facility];  Service: Orthopedics;  Laterality: Left;    SOCIAL HISTORY: Social History   Socioeconomic History  . Marital status: Married    Spouse name: Not on file  . Number of children: Not on file  . Years of education: Not on file  . Highest education level: Not on file  Occupational History  . Not on file  Tobacco Use  . Smoking status: Current Every Day Smoker    Packs/day: 1.00    Years: 10.00    Pack years: 10.00    Types: Cigarettes  . Smokeless tobacco: Never Used  Vaping Use  . Vaping Use: Never used  Substance and Sexual Activity  . Alcohol use: Yes    Comment: 10/18/2015 "down to maybe 1 beer q 2 weeks or so"  . Drug use: Yes    Types: Cocaine, Marijuana    Comment: 10/18/2015 "quit cocaine recently"  . Sexual activity: Not Currently    Birth control/protection: None  Other Topics Concern  .  Not on file  Social History Narrative  . Not on file   Social Determinants of Health   Financial Resource Strain: Not on file  Food Insecurity: Not on file  Transportation Needs: Not on file  Physical Activity: Not on file  Stress: Not on file  Social Connections: Not on file  Intimate Partner Violence: Not on file    FAMILY HISTORY: Family History  Problem Relation Age of Onset  . Clotting disorder Mother   . Heart Problems Father   . Clotting disorder Maternal Uncle     ALLERGIES:  has No Known Allergies.  MEDICATIONS:  Current Outpatient Medications  Medication Sig Dispense Refill  . acetaminophen (TYLENOL) 500 MG tablet  Take 500 mg by mouth every 6 (six) hours as needed.    . ferrous sulfate 325 (65 FE) MG EC tablet Take 1 tablet (325 mg total) by mouth daily with breakfast. 30 tablet 3  . medroxyPROGESTERone (PROVERA) 10 MG tablet Take 2 tablets (20 mg total) by mouth daily. If bleeding ever becomes heavy, call us 180 tablet 3   No current facility-administered medications for this visit.    REVIEW OF SYSTEMS:   Constitutional: ( - ) fevers, ( - )  chills , ( - ) night sweats Eyes: ( - ) blurriness of vision, ( - ) double vision, ( - ) watery eyes Ears, nose, mouth, throat, and face: ( - ) mucositis, ( - ) sore throat Respiratory: ( - ) cough, ( - ) dyspnea, ( - ) wheezes Cardiovascular: ( - ) palpitation, ( - ) chest discomfort, ( - ) lower extremity swelling Gastrointestinal:  ( - ) nausea, ( - ) heartburn, ( - ) change in bowel habits Skin: ( - ) abnormal skin rashes Lymphatics: ( - ) new lymphadenopathy, ( - ) easy bruising Neurological: ( - ) numbness, ( - ) tingling, ( - ) new weaknesses Behavioral/Psych: ( - ) mood change, ( - ) new changes  All other systems were reviewed with the patient and are negative.  PHYSICAL EXAMINATION: ECOG PERFORMANCE STATUS: 0 - Asymptomatic  Vitals:   09/28/20 1548  BP: 106/74  Pulse: 81  Resp: 17  Temp: (!) 97.5 F (36.4 C)  SpO2: 100%   Filed Weights   09/28/20 1548  Weight: 203 lb 1.6 oz (92.1 kg)    GENERAL: well appearing young [place] American female. alert, no distress and comfortable SKIN: skin color, texture, turgor are normal, no rashes or significant lesions EYES: conjunctiva are pink and non-injected, sclera clear LUNGS: clear to auscultation and percussion with normal breathing effort HEART: regular rate & rhythm and no murmurs and no lower extremity edema Musculoskeletal: no cyanosis of digits and no clubbing  PSYCH: alert & oriented x 3, fluent speech NEURO: no focal motor/sensory deficits  LABORATORY DATA:  I have reviewed the data  as listed CBC Latest Ref Rng & Units 09/28/2020 06/13/2020 03/10/2020  WBC 4.0 - 10.5 K/uL 11.0(H) 9.8 10.1  Hemoglobin 12.0 - 15.0 g/dL 13.2 13.5 11.4(L)  Hematocrit 36.0 - 46.0 % 39.8 41.7 33.1(L)  Platelets 150 - 400 K/uL 374 427(H) 505(H)    CMP Latest Ref Rng & Units 09/28/2020 06/13/2020 03/10/2020  Glucose 70 - 99 mg/dL 58(L) 88 86  BUN 6 - 20 mg/dL 9 9 8   Creatinine 0.44 - 1.00 mg/dL 0.82 0.87 0.67  Sodium 135 - 145 mmol/L 136 141 136  Potassium 3.5 - 5.1 mmol/L 4.3 4.3 3.5  Chloride 98 - 111  mmol/L 108 108 108  CO2 22 - 32 mmol/L 24 25 22   Calcium 8.9 - 10.3 mg/dL 8.8(L) 9.3 8.6(L)  Total Protein 6.5 - 8.1 g/dL 7.5 7.5 6.3(L)  Total Bilirubin 0.3 - 1.2 mg/dL 0.2(L) 0.2(L) <0.2(L)  Alkaline Phos 38 - 126 U/L 45 50 40  AST 15 - 41 U/L 13(L) 15 12(L)  ALT 0 - 44 U/L 10 17 7     No results found for: MPROTEIN No results found for: KPAFRELGTCHN, LAMBDASER, KAPLAMBRATIO   RADIOGRAPHIC STUDIES: No results found.  ASSESSMENT & PLAN Ellen Stone 35 y.o. female with medical history significant for iron deficiency anemia presents for a follow up visit.   After review of the labs, the records, discussion with the patient the findings are consistent with improvement in thrombocytosis and anemia with iron pills.  We recommend that she take these daily from this point forward as unfortunately she was taking them sporadically only on mornings which she was waking up early enough to take them with breakfast.  She is not having any ill effects from the p.o. iron and therefore can continue this and there is no need for consideration of IV iron at this time.  There is no need for routine f/u as her Hgb has normalized. Encourage her to continue seeing OB/GYN regarding control of her menstrual bleeding to assure she does not become iron deficient again.   # Thrombocytosis, chronic. Resolved # Leukocytosis #Iron Deficiency Anemia 2/2 to GYN Blood loss --will order CBC, CMP retic panel, and iron  studies with ferritin --thrombocytosis is improving, leukocytosis has resolved. Etiology of leukocytosis has been unclear.  --MPN workup with BCR/ABL FISH and JAK2 w/ reflex was negative. Leukocytosis resolved.  --continue PO ferrous sulfate 325mg  PO daily with a source of vitamin C.  --patient established with OB/GYN and is on Provera to control menstrual bleeding. --if bleeding continues to cause anemia despite PO iron therapy Can consider IV iron treatment.  --RTC in 6 months.   No orders of the defined types were placed in this encounter.   All questions were answered. The patient knows to call the clinic with any problems, questions or concerns.  A total of more than 30 minutes were spent on this encounter and over half of that time was spent on counseling and coordination of care as outlined above.   Ledell Peoples, MD Department of Hematology/Oncology [place] [facility] Phone: [phone] Pager: [phone] Email: [email or web address]  09/30/2020 12:55 PM

## 2020-09-29 LAB — FERRITIN: Ferritin: 27 ng/mL (ref 11–307)

## 2020-09-29 LAB — IRON AND TIBC
Iron: 69 ug/dL (ref 41–142)
Saturation Ratios: 17 % — ABNORMAL LOW (ref 21–57)
TIBC: 416 ug/dL (ref 236–444)
UIBC: 348 ug/dL (ref 120–384)

## 2020-09-30 ENCOUNTER — Encounter: Payer: Self-pay | Admitting: Hematology and Oncology

## 2020-10-03 ENCOUNTER — Telehealth: Payer: Self-pay | Admitting: Hematology and Oncology

## 2020-10-03 NOTE — Telephone Encounter (Signed)
Attempted to contact patient regarding her upcoming appointment. Unable to leave voicemail. Mailed calendar.

## 2020-11-11 ENCOUNTER — Emergency Department (HOSPITAL_COMMUNITY)
Admission: EM | Admit: 2020-11-11 | Discharge: 2020-11-11 | Disposition: A | Discharge status: Home / Self Care | Payer: Self-pay | Attending: Emergency Medicine | Admitting: Emergency Medicine

## 2020-11-11 ENCOUNTER — Other Ambulatory Visit: Payer: Self-pay

## 2020-11-11 ENCOUNTER — Encounter (HOSPITAL_COMMUNITY): Payer: Self-pay

## 2020-11-11 DIAGNOSIS — R112 Nausea with vomiting, unspecified: Secondary | ICD-10-CM | POA: Insufficient documentation

## 2020-11-11 DIAGNOSIS — R519 Headache, unspecified: Secondary | ICD-10-CM | POA: Insufficient documentation

## 2020-11-11 DIAGNOSIS — R42 Dizziness and giddiness: Secondary | ICD-10-CM | POA: Insufficient documentation

## 2020-11-11 DIAGNOSIS — F17210 Nicotine dependence, cigarettes, uncomplicated: Secondary | ICD-10-CM | POA: Insufficient documentation

## 2020-11-11 DIAGNOSIS — R21 Rash and other nonspecific skin eruption: Secondary | ICD-10-CM | POA: Insufficient documentation

## 2020-11-11 DIAGNOSIS — I10 Essential (primary) hypertension: Secondary | ICD-10-CM | POA: Insufficient documentation

## 2020-11-11 LAB — I-STAT BETA HCG BLOOD, ED (MC, WL, AP ONLY): I-stat hCG, quantitative: <5 m[IU]/mL (ref <5)

## 2020-11-11 LAB — CBC WITH DIFFERENTIAL/PLATELET
Abs Immature Granulocytes: 0.03 10*3/uL (ref 0.00–0.07)
Basophils Absolute: 0 10*3/uL (ref 0.0–0.1)
Basophils Relative: 0 %
Eosinophils Absolute: 0 10*3/uL (ref 0.0–0.5)
Eosinophils Relative: 0 %
HCT: 45.2 % (ref 36.0–46.0)
Hemoglobin: 15 g/dL (ref 12.0–15.0)
Immature Granulocytes: 0 %
Lymphocytes Relative: 21 %
Lymphs Abs: 1.5 10*3/uL (ref 0.7–4.0)
MCH: 32.5 pg (ref 26.0–34.0)
MCHC: 33.2 g/dL (ref 30.0–36.0)
MCV: 97.8 fL (ref 80.0–100.0)
Monocytes Absolute: 0.5 10*3/uL (ref 0.1–1.0)
Monocytes Relative: 7 %
Neutro Abs: 5.3 10*3/uL (ref 1.7–7.7)
Neutrophils Relative %: 72 %
Platelets: 411 10*3/uL — ABNORMAL HIGH (ref 150–400)
RBC: 4.62 MIL/uL (ref 3.87–5.11)
RDW: 13.9 % (ref 11.5–15.5)
WBC: 7.4 10*3/uL (ref 4.0–10.5)
nRBC: 0 % (ref 0.0–0.2)

## 2020-11-11 LAB — URINALYSIS, ROUTINE W REFLEX MICROSCOPIC
Bilirubin Urine: NEGATIVE
Glucose, UA: NEGATIVE mg/dL
Ketones, ur: NEGATIVE mg/dL
Leukocytes,Ua: NEGATIVE
Nitrite: NEGATIVE
Protein, ur: NEGATIVE mg/dL
Specific Gravity, Urine: 1.024 (ref 1.005–1.030)
pH: 6 (ref 5.0–8.0)

## 2020-11-11 LAB — COMPREHENSIVE METABOLIC PANEL
ALT: 60 U/L — ABNORMAL HIGH (ref 0–44)
AST: 68 U/L — ABNORMAL HIGH (ref 15–41)
Albumin: 4.2 g/dL (ref 3.5–5.0)
Alkaline Phosphatase: 45 U/L (ref 38–126)
Anion gap: 7 (ref 5–15)
BUN: 7 mg/dL (ref 6–20)
CO2: 19 mmol/L — ABNORMAL LOW (ref 22–32)
Calcium: 8.9 mg/dL (ref 8.9–10.3)
Chloride: 109 mmol/L (ref 98–111)
Creatinine, Ser: 0.77 mg/dL (ref 0.44–1.00)
GFR, Estimated: >60 mL/min (ref >60)
Glucose, Bld: 89 mg/dL (ref 70–99)
Potassium: 4.3 mmol/L (ref 3.5–5.1)
Sodium: 135 mmol/L (ref 135–145)
Total Bilirubin: 0.6 mg/dL (ref 0.3–1.2)
Total Protein: 7.2 g/dL (ref 6.5–8.1)

## 2020-11-11 LAB — LIPASE, BLOOD: Lipase: 36 U/L (ref 11–51)

## 2020-11-11 MED ORDER — SODIUM CHLORIDE 0.9 % IV BOLUS
1000.0000 mL | Freq: Once | INTRAVENOUS | Status: AC
  Administered 2020-11-11: 1000 mL via INTRAVENOUS

## 2020-11-11 MED ORDER — MECLIZINE HCL 12.5 MG PO TABS: 12.5000 mg | ORAL_TABLET | Freq: Three times a day (TID) | ORAL | 0 refills | Status: DC | PRN

## 2020-11-11 MED ORDER — DIPHENHYDRAMINE HCL 50 MG/ML IJ SOLN
12.5000 mg | Freq: Once | INTRAMUSCULAR | Status: AC
  Administered 2020-11-11: 12.5 mg via INTRAVENOUS
  Filled 2020-11-11: qty 1

## 2020-11-11 MED ORDER — ONDANSETRON 4 MG PO TBDP: 4.0000 mg | ORAL_TABLET | Freq: Three times a day (TID) | ORAL | 0 refills | Status: DC | PRN

## 2020-11-11 MED ORDER — PROCHLORPERAZINE EDISYLATE 10 MG/2ML IJ SOLN
10.0000 mg | Freq: Once | INTRAMUSCULAR | Status: AC
  Administered 2020-11-11: 10 mg via INTRAVENOUS
  Filled 2020-11-11: qty 2

## 2020-11-11 MED ORDER — NAPROXEN 500 MG PO TABS: 500.0000 mg | ORAL_TABLET | Freq: Two times a day (BID) | ORAL | 0 refills | Status: DC

## 2020-11-11 NOTE — ED Triage Notes (Signed)
Pt from home with complaints of dizziness with movement that started today and right sided head pain x3 days. Pt complains of nausea and has only vomited once this morning. VSS

## 2020-11-11 NOTE — Discharge Instructions (Signed)
I have written for a few medications to help with your headache and dizziness.  Take as prescribed  Return for any worsening symptoms

## 2020-11-11 NOTE — ED Provider Notes (Signed)
[facility] EMERGENCY DEPARTMENT Provider Note   CSN: [id number] Arrival date & time: 11/11/20  1102     History Chief Complaint  Patient presents with  . Dizziness    Ellen Stone is a 35 y.o. female with past medical history significant for cocaine use, last use in 2017, hypertension, thrombocytosis who presents for evaluation of lightheadedness.  Patient states she has had a headache which has been intermittent to the right frontal side of her head over the last 3 days.  Has history of headaches.  She is unable to tell me if this feels similar.  Pain does not extend into her neck or posterior head.  She denies any sudden onset thunderclap headache.  States she feels dizzy and lightheaded when she turns her head from side to side.  Describes this as the room is spinning. She denies any heavy menstrual cycles.  NBNB emesis this morning when turned her head quickly. One episode.  Patient denies positional HA. She is on provera for heavy cycles due to fibroids. She denies any prior history of malignancy.  Denies any vision changes, paresthesias, weakness, chest pain, shortness of breath, facial droop.  She rates her current HA a 4/10.  Rashes to her face.  No photophobia or phonophobia. Denies additional aggravating or alleviating factors.  History obtained from patient and past medical records.  No interpreter used.  HPI     Past Medical History:  Diagnosis Date  . Chronic bronchitis ([place])   . Cocaine abuse ([place])   . Hyperphosphatemia 10/20/2015  . Hypertension   . Low TSH level 09/29/2019  . Recurrent UTI (urinary tract infection)    "none lately; since I started drinking more water" (10/18/2015)  . Vitamin D deficiency 10/20/2015    Patient Active Problem List   Diagnosis Date Noted  . Hypertension   . History of trichomoniasis 01/27/2020  . Adenomyosis 09/29/2019  . Low TSH level 09/29/2019  . Thrombocytosis 09/17/2019  . Vitamin D deficiency 10/20/2015  .  Hyperphosphatemia 10/20/2015    Past Surgical History:  Procedure Laterality Date  . FRACTURE SURGERY    . ORIF PROXIMAL TIBIAL PLATEAU FRACTURE Left 10/18/2015  . ORIF TIBIA PLATEAU Left 10/18/2015   Procedure: OPEN REDUCTION INTERNAL FIXATION (ORIF) LEFT TIBIAL PLATEAU;  Surgeon: Altamese Alfalfa, MD;  Location: [facility];  Service: Orthopedics;  Laterality: Left;     OB History    Gravida  0   Para  0   Term  0   Preterm  0   AB  0   Living  0     SAB  0   IAB  0   Ectopic  0   Multiple  0   Live Births  0           Family History  Problem Relation Age of Onset  . Clotting disorder Mother   . Heart Problems Father   . Clotting disorder Maternal Uncle     Social History   Tobacco Use  . Smoking status: Current Every Day Smoker    Packs/day: 1.00    Years: 10.00    Pack years: 10.00    Types: Cigarettes  . Smokeless tobacco: Never Used  Vaping Use  . Vaping Use: Never used  Substance Use Topics  . Alcohol use: Yes    Comment: 10/18/2015 "down to maybe 1 beer q 2 weeks or so"  . Drug use: Yes    Types: Cocaine, Marijuana  Comment: 10/18/2015 "quit cocaine recently"    Home Medications Prior to Admission medications   Medication Sig Start Date End Date Taking? Authorizing Provider  acetaminophen (TYLENOL) 500 MG tablet Take 5,000 mg by mouth as needed (pain).   Yes [provider]  ferrous sulfate 325 (65 FE) MG EC tablet Take 1 tablet (325 mg total) by mouth daily with breakfast. 12/11/19  Yes Orson Slick, MD  meclizine (ANTIVERT) 12.5 MG tablet Take 1 tablet (12.5 mg total) by mouth 3 (three) times daily as needed for dizziness. 11/11/20  Yes Jaquana Geiger A, PA-C  medroxyPROGESTERone (PROVERA) 10 MG tablet Take 2 tablets (20 mg total) by mouth daily. If bleeding ever becomes heavy, call us 08/15/20 11/13/20 Yes Aletha Halim, MD  naproxen (NAPROSYN) 500 MG tablet Take 1 tablet (500 mg total) by mouth 2 (two) times daily. 11/11/20  Yes  Tiyanna Larcom A, PA-C  ondansetron (ZOFRAN ODT) 4 MG disintegrating tablet Take 1 tablet (4 mg total) by mouth every 8 (eight) hours as needed for nausea or vomiting. 11/11/20  Yes Hardeep Reetz A, PA-C    Allergies    Patient has no known allergies.  Review of Systems   Review of Systems  Constitutional: Negative.   HENT: Negative.   Respiratory: Negative.   Cardiovascular: Negative.   Gastrointestinal: Positive for nausea and vomiting. Negative for abdominal distention, abdominal pain, anal bleeding, blood in stool, constipation, diarrhea and rectal pain.  Genitourinary: Negative.   Musculoskeletal: Negative.   Skin: Negative.   Neurological: Positive for dizziness, light-headedness and headaches. Negative for tremors, seizures, syncope, facial asymmetry, speech difficulty, weakness and numbness.  All other systems reviewed and are negative.   Physical Exam Updated Vital Signs BP 103/70   Pulse 75   Temp 97.8 F (36.6 C) (Oral)   Resp 20   Ht 5\' 11"  (1.803 m)   Wt 92.1 kg   SpO2 99%   BMI 28.32 kg/m   Physical Exam Physical Exam  Constitutional: Pt is oriented to person, place, and time. Pt appears well-developed and well-nourished. No distress.  HENT:  Head: Normocephalic and atraumatic.  Mouth/Throat: Oropharynx is clear and moist.  Eyes: Conjunctivae and EOM are normal. Pupils are equal, round, and reactive to light. No scleral icterus.  No horizontal, vertical or rotational nystagmus  Neck: Normal range of motion. Neck supple.  Full active and passive ROM without pain No midline or paraspinal tenderness No nuchal rigidity or meningeal signs  Cardiovascular: Normal rate, regular rhythm and intact distal pulses.   Pulmonary/Chest: Effort normal and breath sounds normal. No respiratory distress. Pt has no wheezes. No rales.  Abdominal: Soft. Bowel sounds are normal. There is no tenderness. There is no rebound and no guarding.  Musculoskeletal: Normal range of  motion.  Lymphadenopathy:    No cervical adenopathy.  Neurological: Pt. is alert and oriented to person, place, and time. He has normal reflexes. No cranial nerve deficit.  Exhibits normal muscle tone. Coordination normal.  Mental Status:  Alert, oriented, thought content appropriate. Speech fluent without evidence of aphasia. Able to follow 2 step commands without difficulty.  Cranial Nerves:  II:  Peripheral visual fields grossly normal, pupils equal, round, reactive to light III,IV, VI: ptosis not present, extra-ocular motions intact bilaterally  V,VII: smile symmetric, facial light touch sensation equal VIII: hearing grossly normal bilaterally  IX,X: midline uvula rise  XI: bilateral shoulder shrug equal and strong XII: midline tongue extension  Motor:  5/5 in upper and lower  extremities bilaterally including strong and equal grip strength and dorsiflexion/plantar flexion Sensory: Pinprick and light touch normal in all extremities.  Deep Tendon Reflexes: 2+ and symmetric  Cerebellar: normal finger-to-nose with bilateral upper extremities Gait: normal gait and balance CV: distal pulses palpable throughout   Skin: Skin is warm and dry. No rash noted. Pt is not diaphoretic.  Psychiatric: Pt has a normal mood and affect. Behavior is normal. Judgment and thought content normal.  Nursing note and vitals reviewed. ED Results / Procedures / Treatments   Labs (all labs ordered are listed, but only abnormal results are displayed) Labs Reviewed  CBC WITH DIFFERENTIAL/PLATELET - Abnormal; Notable for the following components:      Result Value   Platelets 411 (*)    All other components within normal limits  COMPREHENSIVE METABOLIC PANEL - Abnormal; Notable for the following components:   CO2 19 (*)    AST 68 (*)    ALT 60 (*)    All other components within normal limits  URINALYSIS, ROUTINE W REFLEX MICROSCOPIC - Abnormal; Notable for the following components:   Hgb urine dipstick  LARGE (*)    Bacteria, UA RARE (*)    All other components within normal limits  LIPASE, BLOOD  I-STAT BETA HCG BLOOD, ED ([facility], [facility], [facility] ONLY)    EKG EKG Interpretation  Date/Time:  Friday November 11 2020 11:20:58 EDT Ventricular Rate:  67 PR Interval:  150 QRS Duration: 91 QT Interval:  411 QTC Calculation: 434 R Axis:   97 Text Interpretation: Sinus arrhythmia Borderline right axis deviation Baseline wander in lead(s) V1 when compared to prior, slower rate. No STEMI Confirmed by Antony Blackbird [phone]) on 11/11/2020 12:21:50 PM   Radiology No results found.  Procedures Procedures   Medications Ordered in ED Medications  sodium chloride 0.9 % bolus 1,000 mL (0 mLs Intravenous Stopped 11/11/20 1704)  prochlorperazine (COMPAZINE) injection 10 mg (10 mg Intravenous Given 11/11/20 1336)  diphenhydrAMINE (BENADRYL) injection 12.5 mg (12.5 mg Intravenous Given 11/11/20 1336)    ED Course  I have reviewed the triage vital signs and the nursing notes.  Pertinent labs & imaging results that were available during my care of the patient were reviewed by me and considered in my medical decision making (see chart for details).  35 year old here for evaluation of lightheadedness, dizziness when she turns her head as well as intermittent right-sided headache. Pain does not extend into her neck.  She has no neck rigidity or meningismus.  She is afebrile, nonseptic, non-ill-appearing.  Low suspicion for meningitis.  She denies any "sudden onset" headache I have low suspicion for [facility].  No recent head trauma to suggest acute head bleed.  No vision changes low suspicion for pseudotumor cerebri, elevated pressures, dissection.  She is on Provera for heavy menstrual cycles however low suspicion for intracranial thrombosis.  We will plan on checking labs given history of anemia.  We will treat with fluids, migraine cocktail and reassess.  Labs personally reviewed and interpreted:  CBC without leukocytosis,  metabolic panel mild elevation in AST, ALT at 60 and 60 respectively UA negative for infection  lipase 36 Pregnancy test negative EKG without ischemic change  Patient reassessed.  Resolution of symptoms with migraine cocktail.  She continues to have a nonfocal neuro exam without deficits.  Likely migrainous cause of her symptoms.  Reqarding her a mild elevation in her AST and ALT.  Her abdominal exam is benign.  Specifically no focal tenderness to right upper quadrant,  negative Murphy sign.  We will have her follow-up with her PCP for reevaluation of labs, possible outpatient ultrasound as I have low suspicion for acute intra-abdominal cause at this time. No emesis here in ED. No abd pain.  Presentation is like pts typical HA and non concerning for [facility], ICH, Meningitis, venous thrombosis, or temporal arteritis. Pt is afebrile with no focal neuro deficits, nuchal rigidity, or change in vision.  Given nonfocal neuro exam and low suspicion for any central intracranial cause of her lightheadedness or dizziness  The patient has been appropriately medically screened and/or stabilized in the ED. I have low suspicion for any other emergent medical condition which would require further screening, evaluation or treatment in the ED or require inpatient management.  Patient is hemodynamically stable and in no acute distress.  Patient able to ambulate in department prior to ED.  Evaluation does not show acute pathology that would require ongoing or additional emergent interventions while in the emergency department or further inpatient treatment.  I have discussed the diagnosis with the patient and answered all questions.  Pain is been managed while in the emergency department and patient has no further complaints prior to discharge.  Patient is comfortable with plan discussed in room and is stable for discharge at this time.  I have discussed strict return precautions for returning to the emergency department.   Patient was encouraged to follow-up with PCP/specialist refer to at discharge.       MDM Rules/Calculators/A&P                           Final Clinical Impression(s) / ED Diagnoses Final diagnoses:  Lightheadedness  Acute nonintractable headache, unspecified headache type    Rx / DC Orders ED Discharge Orders         Ordered    ondansetron (ZOFRAN ODT) 4 MG disintegrating tablet  Every 8 hours PRN        11/11/20 1702    naproxen (NAPROSYN) 500 MG tablet  2 times daily        11/11/20 1702    meclizine (ANTIVERT) 12.5 MG tablet  3 times daily PRN        11/11/20 1703           Jahziah Simonin A, PA-C 11/11/20 1712    Tegeler, Gwenyth Allegra, MD 11/12/20 2015

## 2020-12-15 ENCOUNTER — Emergency Department (HOSPITAL_COMMUNITY)
Admission: EM | Admit: 2020-12-15 | Discharge: 2020-12-15 | Disposition: A | Discharge status: Home / Self Care | Payer: Self-pay | Attending: Emergency Medicine | Admitting: Emergency Medicine

## 2020-12-15 ENCOUNTER — Encounter (HOSPITAL_COMMUNITY): Payer: Self-pay | Admitting: Emergency Medicine

## 2020-12-15 ENCOUNTER — Emergency Department (HOSPITAL_COMMUNITY): Payer: Self-pay

## 2020-12-15 DIAGNOSIS — R6884 Jaw pain: Secondary | ICD-10-CM | POA: Insufficient documentation

## 2020-12-15 DIAGNOSIS — R221 Localized swelling, mass and lump, neck: Secondary | ICD-10-CM | POA: Insufficient documentation

## 2020-12-15 DIAGNOSIS — I10 Essential (primary) hypertension: Secondary | ICD-10-CM | POA: Insufficient documentation

## 2020-12-15 DIAGNOSIS — F17210 Nicotine dependence, cigarettes, uncomplicated: Secondary | ICD-10-CM | POA: Insufficient documentation

## 2020-12-15 DIAGNOSIS — R070 Pain in throat: Secondary | ICD-10-CM | POA: Insufficient documentation

## 2020-12-15 LAB — I-STAT BETA HCG BLOOD, ED (MC, WL, AP ONLY): I-stat hCG, quantitative: <5 m[IU]/mL (ref <5)

## 2020-12-15 LAB — TROPONIN I (HIGH SENSITIVITY): Troponin I (High Sensitivity): 8 ng/L (ref <18)

## 2020-12-15 LAB — BASIC METABOLIC PANEL
Anion gap: 10 (ref 5–15)
BUN: 10 mg/dL (ref 6–20)
CO2: 16 mmol/L — ABNORMAL LOW (ref 22–32)
Calcium: 9.3 mg/dL (ref 8.9–10.3)
Chloride: 108 mmol/L (ref 98–111)
Creatinine, Ser: 0.78 mg/dL (ref 0.44–1.00)
GFR, Estimated: >60 mL/min (ref >60)
Glucose, Bld: 93 mg/dL (ref 70–99)
Potassium: 3.5 mmol/L (ref 3.5–5.1)
Sodium: 134 mmol/L — ABNORMAL LOW (ref 135–145)

## 2020-12-15 LAB — CBC
HCT: 41.4 % (ref 36.0–46.0)
Hemoglobin: 14.1 g/dL (ref 12.0–15.0)
MCH: 33.3 pg (ref 26.0–34.0)
MCHC: 34.1 g/dL (ref 30.0–36.0)
MCV: 97.9 fL (ref 80.0–100.0)
Platelets: 513 10*3/uL — ABNORMAL HIGH (ref 150–400)
RBC: 4.23 MIL/uL (ref 3.87–5.11)
RDW: 14.7 % (ref 11.5–15.5)
WBC: 10.6 10*3/uL — ABNORMAL HIGH (ref 4.0–10.5)
nRBC: 0 % (ref 0.0–0.2)

## 2020-12-15 MED ORDER — AMOXICILLIN 500 MG PO CAPS: 500.0000 mg | ORAL_CAPSULE | Freq: Three times a day (TID) | ORAL | 0 refills | Status: DC

## 2020-12-15 MED ORDER — IOHEXOL 300 MG/ML  SOLN
50.0000 mL | Freq: Once | INTRAMUSCULAR | Status: AC | PRN
  Administered 2020-12-15: 50 mL via INTRAVENOUS

## 2020-12-15 NOTE — Discharge Instructions (Addendum)
Take the antibiotics as discussed.  Follow-up with ENT if your symptoms are improving.  Return here as needed for any worsening symptoms.

## 2020-12-15 NOTE — ED Triage Notes (Signed)
Patient complains of intermittent sharp right sided jaw pain that started approximately one month ago. Patient states she was seen for same approximately one month ago but symptoms did not resolve. Patient alert, oriented, and in no apparent distress at this time.

## 2020-12-15 NOTE — ED Provider Notes (Signed)
[facility] EMERGENCY DEPARTMENT Provider Note   CSN: [id number] Arrival date & time: 12/15/20  1028     History Chief Complaint  Patient presents with  . Jaw Pain    Ellen Stone is a 35 y.o. female.  Patient is a 35 year old female who presents with throat pain.  She says she has pain in the right side of her jaw.  It hurts to swallow.  She says she does not eat much because it hurts to eat.  She has noticed some intermittent swelling of her neck.  She denies any shortness of breath.  No fevers.  No nausea or vomiting.  No tooth pain.  She says she has missing teeth in that area.  She said she was seen here 2 weeks ago for similar symptoms and has had no improvement.  It started 2 weeks ago and has been progressively worsening.        Past Medical History:  Diagnosis Date  . Chronic bronchitis ([place])   . Cocaine abuse ([place])   . Hyperphosphatemia 10/20/2015  . Hypertension   . Low TSH level 09/29/2019  . Recurrent UTI (urinary tract infection)    "none lately; since I started drinking more water" (10/18/2015)  . Vitamin D deficiency 10/20/2015    Patient Active Problem List   Diagnosis Date Noted  . Hypertension   . History of trichomoniasis 01/27/2020  . Adenomyosis 09/29/2019  . Low TSH level 09/29/2019  . Thrombocytosis 09/17/2019  . Vitamin D deficiency 10/20/2015  . Hyperphosphatemia 10/20/2015    Past Surgical History:  Procedure Laterality Date  . FRACTURE SURGERY    . ORIF PROXIMAL TIBIAL PLATEAU FRACTURE Left 10/18/2015  . ORIF TIBIA PLATEAU Left 10/18/2015   Procedure: OPEN REDUCTION INTERNAL FIXATION (ORIF) LEFT TIBIAL PLATEAU;  Surgeon: Altamese [place], MD;  Location: [facility];  Service: Orthopedics;  Laterality: Left;     OB History    Gravida  0   Para  0   Term  0   Preterm  0   AB  0   Living  0     SAB  0   IAB  0   Ectopic  0   Multiple  0   Live Births  0           Family History  Problem Relation Age of Onset   . Clotting disorder Mother   . Heart Problems Father   . Clotting disorder Maternal Uncle     Social History   Tobacco Use  . Smoking status: Current Every Day Smoker    Packs/day: 1.00    Years: 10.00    Pack years: 10.00    Types: Cigarettes  . Smokeless tobacco: Never Used  Vaping Use  . Vaping Use: Never used  Substance Use Topics  . Alcohol use: Yes    Comment: 10/18/2015 "down to maybe 1 beer q 2 weeks or so"  . Drug use: Yes    Types: Cocaine, Marijuana    Comment: 10/18/2015 "quit cocaine recently"    Home Medications Prior to Admission medications   Medication Sig Start Date End Date Taking? Authorizing Provider  amoxicillin (AMOXIL) 500 MG capsule Take 1 capsule (500 mg total) by mouth 3 (three) times daily. 12/15/20  Yes Malvin Johns, MD  acetaminophen (TYLENOL) 500 MG tablet Take 5,000 mg by mouth as needed (pain).    [provider]  ferrous sulfate 325 (65 FE) MG EC tablet Take 1 tablet (325 mg  total) by mouth daily with breakfast. 12/11/19   Orson Slick, MD  meclizine (ANTIVERT) 12.5 MG tablet Take 1 tablet (12.5 mg total) by mouth 3 (three) times daily as needed for dizziness. 11/11/20   Henderly, Britni A, PA-C  medroxyPROGESTERone (PROVERA) 10 MG tablet Take 2 tablets (20 mg total) by mouth daily. If bleeding ever becomes heavy, call us 08/15/20 11/13/20  Aletha Halim, MD  naproxen (NAPROSYN) 500 MG tablet Take 1 tablet (500 mg total) by mouth 2 (two) times daily. 11/11/20   Henderly, Britni A, PA-C  ondansetron (ZOFRAN ODT) 4 MG disintegrating tablet Take 1 tablet (4 mg total) by mouth every 8 (eight) hours as needed for nausea or vomiting. 11/11/20   Henderly, Britni A, PA-C    Allergies    Patient has no known allergies.  Review of Systems   Review of Systems  Constitutional: Negative for chills, diaphoresis, fatigue and fever.  HENT: Positive for facial swelling, sore throat and trouble swallowing. Negative for congestion, rhinorrhea and  sneezing.   Eyes: Negative.   Respiratory: Negative for cough, chest tightness and shortness of breath.   Cardiovascular: Negative for chest pain and leg swelling.  Gastrointestinal: Negative for abdominal pain, blood in stool, diarrhea, nausea and vomiting.  Genitourinary: Negative for difficulty urinating, flank pain, frequency and hematuria.  Musculoskeletal: Negative for arthralgias and back pain.  Skin: Negative for rash.  Neurological: Negative for dizziness, speech difficulty, weakness, numbness and headaches.    Physical Exam Updated Vital Signs BP 102/61   Pulse 82   Temp 99 F (37.2 C) (Oral)   Resp 15   SpO2 100%   Physical Exam Constitutional:      Appearance: She is well-developed.  HENT:     Head: Normocephalic and atraumatic.     Mouth/Throat:     Mouth: Mucous membranes are moist.     Pharynx: Oropharynx is clear.     Comments: No erythema or exudates to the posterior pharynx, no trismus, no elevation of tongue, tenderness to the right submandibular area.  No swelling or lesions are noted. Eyes:     Pupils: Pupils are equal, round, and reactive to light.  Cardiovascular:     Rate and Rhythm: Normal rate and regular rhythm.     Heart sounds: Normal heart sounds.  Pulmonary:     Effort: Pulmonary effort is normal. No respiratory distress.     Breath sounds: Normal breath sounds. No wheezing or rales.  Chest:     Chest wall: No tenderness.  Abdominal:     General: Bowel sounds are normal.     Palpations: Abdomen is soft.     Tenderness: There is no abdominal tenderness. There is no guarding or rebound.  Musculoskeletal:        General: Normal range of motion.     Cervical back: Normal range of motion and neck supple.  Lymphadenopathy:     Cervical: No cervical adenopathy.  Skin:    General: Skin is warm and dry.     Findings: No rash.  Neurological:     Mental Status: She is alert and oriented to person, place, and time.     ED Results / Procedures  / Treatments   Labs (all labs ordered are listed, but only abnormal results are displayed) Labs Reviewed  BASIC METABOLIC PANEL - Abnormal; Notable for the following components:      Result Value   Sodium 134 (*)    CO2 16 (*)  All other components within normal limits  CBC - Abnormal; Notable for the following components:   WBC 10.6 (*)    Platelets 513 (*)    All other components within normal limits  I-STAT BETA HCG BLOOD, ED ([facility], [facility], [facility] ONLY)  TROPONIN I (HIGH SENSITIVITY)    EKG EKG Interpretation  Date/Time:  Thursday December 15 2020 10:33:18 EDT Ventricular Rate:  110 PR Interval:  136 QRS Duration: 74 QT Interval:  324 QTC Calculation: 438 R Axis:   96 Text Interpretation: Sinus tachycardia Right atrial enlargement Rightward axis Cannot rule out Anterior infarct , age undetermined Abnormal ECG SINCE LAST TRACING HEART RATE HAS INCREASED Confirmed by Malvin Johns [id number] on 12/15/2020 11:00:30 AM   Radiology DG Chest 2 View  Result Date: 12/15/2020 CLINICAL DATA:  Chest pain EXAM: CHEST - 2 VIEW COMPARISON:  August 21, 2011 FINDINGS: Lungs are clear. Heart size and pulmonary vascularity are normal. No adenopathy. No pneumothorax. No bone lesions. IMPRESSION: Lungs clear.  Cardiac silhouette normal. Electronically Signed   By: Lowella Grip III M.D.   On: 12/15/2020 11:13   CT Soft Tissue Neck W Contrast  Result Date: 12/15/2020 CLINICAL DATA:  Worsening throat pain; technologist note states intermittent sharp right jaw pain EXAM: CT NECK WITH CONTRAST TECHNIQUE: Multidetector CT imaging of the neck was performed using the standard protocol following the bolus administration of intravenous contrast. CONTRAST:  63mL OMNIPAQUE IOHEXOL 300 MG/ML  SOLN COMPARISON:  None. FINDINGS: Pharynx and larynx: Unremarkable.  No mass or swelling. Salivary glands: Parotid and submandibular glands are unremarkable. Thyroid: Normal. Lymph nodes: No enlarged or abnormal density nodes.  Vascular: Aberrant origin of the right subclavian artery, an anatomic variant. Major neck vessels are patent. Limited intracranial: Unremarkable. Visualized orbits: Unremarkable. Mastoids and visualized paranasal sinuses: Visualized portions are aerated. Skeleton: No acute osseous abnormality. Upper chest: Included upper lungs are clear. Other: Multiple dental caries again identified. No periapical lucencies along the right mandible. IMPRESSION: No neck mass, adenopathy, or significant inflammatory changes. Dental disease without soft tissue abscess. Electronically Signed   By: Macy Mis M.D.   On: 12/15/2020 13:02    Procedures Procedures   Medications Ordered in ED Medications  iohexol (OMNIPAQUE) 300 MG/ML solution 50 mL (50 mLs Intravenous Contrast Given 12/15/20 1249)    ED Course  I have reviewed the triage vital signs and the nursing notes.  Pertinent labs & imaging results that were available during my care of the patient were reviewed by me and considered in my medical decision making (see chart for details).    MDM Rules/Calculators/A&P                          Patient is a 35 year old who presents with pain along the right mandible.  She says that its been swelling and she has trouble swallowing although I do not appreciate any swelling.  She does not seem to have any trouble controlling her secretions.  She is afebrile.  Given that this is reportedly her second visit, CT scan was performed which showed no masses or suggestions of infection.  Her labs are nonconcerning.  Cardiac protocol was initiated in triage although on my exam, she does not have symptoms that sound more cardiac in nature.  She does have some bad teeth although she does not specifically have any pain along the teeth.  However given this I will give her a trial of amoxicillin.  I will also give  her a referral to ENT if her symptoms are not improving.  Return precautions were given. Final Clinical Impression(s) /  ED Diagnoses Final diagnoses:  Jaw pain    Rx / DC Orders ED Discharge Orders         Ordered    amoxicillin (AMOXIL) 500 MG capsule  3 times daily        12/15/20 1422           Malvin Johns, MD 12/15/20 1426

## 2020-12-15 NOTE — ED Notes (Signed)
Taken to CT by transporter.

## 2020-12-18 ENCOUNTER — Other Ambulatory Visit: Payer: Self-pay | Admitting: Obstetrics and Gynecology

## 2020-12-18 DIAGNOSIS — N939 Abnormal uterine and vaginal bleeding, unspecified: Secondary | ICD-10-CM

## 2020-12-18 MED ORDER — MEDROXYPROGESTERONE ACETATE 10 MG PO TABS: 20.0000 mg | ORAL_TABLET | Freq: Every day | ORAL | 5 refills | Status: DC

## 2020-12-18 NOTE — Progress Notes (Signed)
Rx for Provera sent to pharmacy

## 2021-01-06 ENCOUNTER — Ambulatory Visit: Payer: Self-pay | Admitting: *Deleted

## 2021-01-06 NOTE — Telephone Encounter (Signed)
Reason for Disposition  [1] SEVERE neck pain (e.g., excruciating, unable to do any normal activities) AND [2] not improved after 2 hours of pain medicine  Answer Assessment - Initial Assessment Questions 1. ONSET: "When did the pain begin?"      X 1 month ago and now pain daily  2. LOCATION: "Where does it hurt?"      Right side of neck and face and eye 3. PATTERN "Does the pain come and go, or has it been constant since it started?"      Comes and goes. Pain was happening once every 1-2 times a week , now happening daily 4. SEVERITY: "How bad is the pain?"  (Scale 1-10; or mild, moderate, severe)   - NO PAIN (0): no pain or only slight stiffness    - MILD (1-3): doesn't interfere with normal activities    - MODERATE (4-7): interferes with normal activities or awakens from sleep    - SEVERE (8-10):  excruciating pain, unable to do any normal activities      Moderate  5. RADIATION: "Does the pain go anywhere else, shoot into your arms?"     Face  6. CORD SYMPTOMS: "Any weakness or numbness of the arms or legs?"     No  7. CAUSE: "What do you think is causing the neck pain?"     Unknown  8. NECK OVERUSE: "Any recent activities that involved turning or twisting the neck?"     No  9. OTHER SYMPTOMS: "Do you have any other symptoms?" (e.g., headache, fever, chest pain, difficulty breathing, neck swelling)     When pain occurs, pain swallowing , and pressure in neck and face  10. PREGNANCY: "Is there any chance you are pregnant?" "When was your last menstrual period?"       No LMP now  Protocols used: Neck Pain or Stiffness-A-AH

## 2021-01-06 NOTE — Telephone Encounter (Signed)
C/o right neck and throat pain x 1 month. Pain was happening 1-2 times per week and now happening daily. Pain starts when laying down. C/o feeling "hot and sweaty" but has chill bumps. C/o pain from top of ear down throat and neck on right side . Reports when pain occurs she has to apply pressure on neck and face and right eye for pain to stop or decrease. Patient reports pain is more pressure inside of neck not outside of neck. Denies fever, swelling , chest pain, difficulty breathing. C/o when pain occurs she is unable to swallow and is now affecting her work. Patient verbalized she is very anxious due to family has hx of brain CA and she is getting scared. Patient has made multiple trips to ED due to sx and is always sent home. Earliest appt scheduled for 03/07/21. Please notify if earlier appt can be scheduled. Patient reports she is frustrated with going to ED for sx and always being sent home. Tylenol is not helping with pain level. Care advise given. Patient verbalized understanding of care advise and to call back or go to [facility] or ED if symptoms worsen.Marland Kitchen

## 2021-01-09 NOTE — Telephone Encounter (Signed)
Pt has an upcoming appt on 8/23

## 2021-01-10 NOTE — Telephone Encounter (Signed)
Returned pt call and made aware that we don't have any avaiablility at the office but she can go to out mobile bus today that is located at  [place] pt aware that they are there from 9am-630pm. Made pt aware they take the last pt in the morning at 12pm and the last pt in the evening at 530pm.

## 2021-01-26 ENCOUNTER — Encounter: Payer: Self-pay | Admitting: Obstetrics and Gynecology

## 2021-01-26 ENCOUNTER — Ambulatory Visit (INDEPENDENT_AMBULATORY_CARE_PROVIDER_SITE_OTHER): Payer: Self-pay | Admitting: Obstetrics and Gynecology

## 2021-01-26 ENCOUNTER — Other Ambulatory Visit: Payer: Self-pay

## 2021-01-26 VITALS — BP 119/81 | HR 90 | Ht 71.0 in | Wt 190.3 lb

## 2021-01-26 DIAGNOSIS — N800 Endometriosis of uterus: Secondary | ICD-10-CM

## 2021-01-26 DIAGNOSIS — N8003 Adenomyosis of the uterus: Secondary | ICD-10-CM

## 2021-01-26 DIAGNOSIS — R7401 Elevation of levels of liver transaminase levels: Secondary | ICD-10-CM

## 2021-01-26 DIAGNOSIS — N939 Abnormal uterine and vaginal bleeding, unspecified: Secondary | ICD-10-CM

## 2021-01-26 HISTORY — DX: Elevation of levels of liver transaminase levels: R74.01

## 2021-01-26 NOTE — Progress Notes (Signed)
Obstetrics and Gynecology Visit Return Patient Evaluation  Appointment Date: 01/26/2021  Primary Care Provider: [place], [place] [facility]  Chief Complaint: Adenomyosis, AUB surveillance  History of Present Illness:  Ellen Stone is a 35 y.o. P0 with above CC. PMHx significant for severe adenomyosis on MRI, AUB, HTN, thrombocytosis (followed by Heme), tobacco abuse, abnormal LFTs, h/o Trich  Patient on provera bid with plan for LNG IUD with patient given application multiple times; she is self pay.   Interval History: Since that time, she states that the provera helps the AUB but she is tired of taking pills almost every day to keep the bleeding from coming on/to help the bleeding; she hasn't used the pills in a week. No current bleeding.  She states she would like a hysterectomy.   Review of Systems: as noted in the History of Present Illness.  Patient Active Problem List   Diagnosis Date Noted   Transaminitis 01/26/2021   Hypertension    Adenomyosis 09/29/2019   Thrombocytosis 09/17/2019   Vitamin D deficiency 10/20/2015   Hyperphosphatemia 10/20/2015   Medications:  Clinton Wahlberg. Sterba had no medications administered during this visit. Current Outpatient Medications  Medication Sig Dispense Refill   acetaminophen (TYLENOL) 500 MG tablet Take 5,000 mg by mouth as needed (pain).     ferrous sulfate 325 (65 FE) MG EC tablet Take 1 tablet (325 mg total) by mouth daily with breakfast. 30 tablet 3   amoxicillin (AMOXIL) 500 MG capsule Take 1 capsule (500 mg total) by mouth 3 (three) times daily. (Patient not taking: Reported on 01/26/2021) 21 capsule 0   meclizine (ANTIVERT) 12.5 MG tablet Take 1 tablet (12.5 mg total) by mouth 3 (three) times daily as needed for dizziness. (Patient not taking: Reported on 01/26/2021) 30 tablet 0   medroxyPROGESTERone (PROVERA) 10 MG tablet Take 2 tablets (20 mg total) by mouth daily. If bleeding  ever becomes heavy, call us 60 tablet 5   naproxen (NAPROSYN) 500 MG tablet Take 1 tablet (500 mg total) by mouth 2 (two) times daily. (Patient not taking: Reported on 01/26/2021) 30 tablet 0   ondansetron (ZOFRAN ODT) 4 MG disintegrating tablet Take 1 tablet (4 mg total) by mouth every 8 (eight) hours as needed for nausea or vomiting. (Patient not taking: Reported on 01/26/2021) 20 tablet 0   No current facility-administered medications for this visit.    Allergies: has No Known Allergies.  Physical Exam:  BP 119/81   Pulse 90   Ht 5\' 11"  (1.803 m)   Wt 190 lb 4.8 oz (86.3 kg)   BMI 26.54 kg/m  Body mass index is 26.54 kg/m. General appearance: Well nourished, well developed female in no acute distress.  Neuro/Psych:  Normal mood and affect.    Assessment: pt stable  Plan:  1. Adenomyosis Patient states she wants definitive treatment and she is tired of taking pills. I told her I'm confident that she would do very well with an LNG IUD and it preserve her fertility. I told her that a hysterectomy is a major surgery and expected to miss approximately a month of work and risk of complications; she would like to proceed. Application for cone financial assistance given and I told her to continue on the provera in the interim and when she hears back about her application to let us know. Patient also reminded of [place] [facility] appt for net month.   2. AUB   RTC:  PRN  Durene Romans MD Attending [facility] for [employer] manager)

## 2021-02-23 ENCOUNTER — Ambulatory Visit: Payer: Self-pay | Admitting: Internal Medicine

## 2021-02-24 ENCOUNTER — Ambulatory Visit: Payer: Self-pay | Attending: Internal Medicine | Admitting: Internal Medicine

## 2021-02-24 ENCOUNTER — Other Ambulatory Visit: Payer: Self-pay

## 2021-02-24 ENCOUNTER — Ambulatory Visit (HOSPITAL_COMMUNITY)
Admission: RE | Admit: 2021-02-24 | Discharge: 2021-02-24 | Disposition: A | Discharge status: Home / Self Care | Payer: Self-pay | Source: Ambulatory Visit | Attending: Internal Medicine | Admitting: Internal Medicine

## 2021-02-24 VITALS — BP 104/70 | HR 106 | Ht 71.0 in | Wt 191.2 lb

## 2021-02-24 DIAGNOSIS — R6884 Jaw pain: Secondary | ICD-10-CM | POA: Insufficient documentation

## 2021-02-24 NOTE — Progress Notes (Signed)
Patient ID: Ellen Stone, female    DOB: Jul 31, 1985  MRN: [id number]  CC: Neck Pain   Subjective: Keywanna Stone is a 35 y.o. female who presents for jaw pain Her concerns today include:  Patient with history of tobacco dependence, adenomyosis with menorrhagia with regular cycle pelvic pain, thrombocytosis  Patient complains of pain in and around lower jaw x1-2 mths  Pain is persistent. Hurts to eat and even talk at times.  No initiating factors. She describes it as a persistent pressure and pain around the jaw line and feeling like area under there is swollen. No sore throat,  no pain with swallowing Has several decayed teeth -upper and lower jaw.  Hurts when she bites down upper RT jaw.  Teeth are sensitive to cold and hot. Seen in the emergency room in June with this concern.  CT of the soft tissue of the neck with contrast was done.  This revealed no neck masses, adenopathy or significant inflammatory changes.  Dental disease was noted without soft tissue abscess.  Multiple dental caries identified. Patient states she is frustrated that they are not finding anything.  She takes Tylenol and ibuprofen.  She has left her job because of the pain.  She has not seen a dentist in a while.  She does not have dental insurance. Patient Active Problem List   Diagnosis Date Noted   Transaminitis 01/26/2021   Abnormal uterine bleeding (AUB) 01/26/2021   Hypertension    Adenomyosis 09/29/2019   Thrombocytosis 09/17/2019   Vitamin D deficiency 10/20/2015   Hyperphosphatemia 10/20/2015     Current Outpatient Medications on File Prior to Visit  Medication Sig Dispense Refill   acetaminophen (TYLENOL) 500 MG tablet Take 5,000 mg by mouth as needed (pain).     amoxicillin (AMOXIL) 500 MG capsule Take 1 capsule (500 mg total) by mouth 3 (three) times daily. (Patient not taking: No sig reported) 21 capsule 0   ferrous sulfate 325 (65 FE) MG EC tablet Take 1 tablet (325 mg total) by mouth daily with  breakfast. (Patient not taking: Reported on 02/24/2021) 30 tablet 3   meclizine (ANTIVERT) 12.5 MG tablet Take 1 tablet (12.5 mg total) by mouth 3 (three) times daily as needed for dizziness. (Patient not taking: No sig reported) 30 tablet 0   medroxyPROGESTERone (PROVERA) 10 MG tablet Take 2 tablets (20 mg total) by mouth daily. If bleeding ever becomes heavy, call us 60 tablet 5   naproxen (NAPROSYN) 500 MG tablet Take 1 tablet (500 mg total) by mouth 2 (two) times daily. (Patient not taking: No sig reported) 30 tablet 0   ondansetron (ZOFRAN ODT) 4 MG disintegrating tablet Take 1 tablet (4 mg total) by mouth every 8 (eight) hours as needed for nausea or vomiting. (Patient not taking: No sig reported) 20 tablet 0   No current facility-administered medications on file prior to visit.    No Known Allergies  Social History   Socioeconomic History   Marital status: Married    Spouse name: Not on file   Number of children: Not on file   Years of education: Not on file   Highest education level: Not on file  Occupational History   Not on file  Tobacco Use   Smoking status: Every Day    Packs/day: 1.00    Years: 10.00    Pack years: 10.00    Types: Cigarettes   Smokeless tobacco: Never  Vaping Use   Vaping Use: Never used  Substance and Sexual Activity   Alcohol use: Yes    Comment: 10/18/2015 "down to maybe 1 beer q 2 weeks or so"   Drug use: Yes    Types: Cocaine, Marijuana    Comment: 10/18/2015 "quit cocaine recently"   Sexual activity: Not Currently    Birth control/protection: None  Other Topics Concern   Not on file  Social History Narrative   Not on file   Social Determinants of Health   Financial Resource Strain: Not on file  Food Insecurity: Not on file  Transportation Needs: Not on file  Physical Activity: Not on file  Stress: Not on file  Social Connections: Not on file  Intimate Partner Violence: Not on file    Family History  Problem Relation Age of Onset    Clotting disorder Mother    Heart Problems Father    Clotting disorder Maternal Uncle     Past Surgical History:  Procedure Laterality Date   FRACTURE SURGERY     ORIF PROXIMAL TIBIAL PLATEAU FRACTURE Left 10/18/2015   ORIF TIBIA PLATEAU Left 10/18/2015   Procedure: OPEN REDUCTION INTERNAL FIXATION (ORIF) LEFT TIBIAL PLATEAU;  Surgeon: Altamese Hewitt, MD;  Location: [place];  Service: Orthopedics;  Laterality: Left;    ROS: Review of Systems Negative except as stated above  PHYSICAL EXAM: BP 104/70   Pulse (!) 106   Ht '5\' 11"'$  (1.803 m)   Wt 191 lb 3.2 oz (86.7 kg)   SpO2 98%   BMI 26.67 kg/m   Wt Readings from Last 3 Encounters:  02/24/21 191 lb 3.2 oz (86.7 kg)  01/26/21 190 lb 4.8 oz (86.3 kg)  11/11/20 203 lb 0.7 oz (92.1 kg)    Physical Exam  General appearance - alert, well appearing, young African-American female and in no distress Mental status - normal mood, behavior, speech, dress, motor activity, and thought processes Nose -patient has erosion of the nasal septum Mouth -no swelling noted of the jaw or or submandibular area.  No swelling of the submandibular or parotid glands.  She is noted to have several cavities in the mouth some of which are broken off in the gum.  No signs of abscess noted at this time.  No masses palpated below the tongue.  Throat is clear without erythema or exudates.  She has mild hypertrophy of the mandibular bone posteriorly on the right side where the wisdom tooth would have sat.  She does not have a wisdom tooth there on that right side. Neck -no cervical lymphadenopathy.  Thyroid is not enlarged.  No thyroid nodules appreciated.  Trachea is in midline.  No supraclavicular lymphadenopathy. CMP Latest Ref Rng & Units 12/15/2020 11/11/2020 09/28/2020  Glucose 70 - 99 mg/dL 93 89 58(L)  BUN 6 - 20 mg/dL '10 7 9  '$ Creatinine 0.44 - 1.00 mg/dL 0.78 0.77 0.82  Sodium 135 - 145 mmol/L 134(L) 135 136  Potassium 3.5 - 5.1 mmol/L 3.5 4.3 4.3  Chloride 98 -  111 mmol/L 108 109 108  CO2 22 - 32 mmol/L 16(L) 19(L) 24  Calcium 8.9 - 10.3 mg/dL 9.3 8.9 8.8(L)  Total Protein 6.5 - 8.1 g/dL - 7.2 7.5  Total Bilirubin 0.3 - 1.2 mg/dL - 0.6 0.2(L)  Alkaline Phos 38 - 126 U/L - 45 45  AST 15 - 41 U/L - 68(H) 13(L)  ALT 0 - 44 U/L - 60(H) 10   Lipid Panel  No results found for: CHOL, TRIG, HDL, CHOLHDL, VLDL, LDLCALC, LDLDIRECT  CBC  Component Value Date/Time   WBC 10.6 (H) 12/15/2020 1036   RBC 4.23 12/15/2020 1036   HGB 14.1 12/15/2020 1036   HGB 13.2 09/28/2020 1526   HGB 11.4 09/17/2019 1606   HCT 41.4 12/15/2020 1036   HCT 34.8 09/17/2019 1606   PLT 513 (H) 12/15/2020 1036   PLT 374 09/28/2020 1526   PLT 656 (H) 09/17/2019 1606   MCV 97.9 12/15/2020 1036   MCV 89 09/17/2019 1606   MCH 33.3 12/15/2020 1036   MCHC 34.1 12/15/2020 1036   RDW 14.7 12/15/2020 1036   RDW 13.3 09/17/2019 1606   LYMPHSABS 1.5 11/11/2020 1320   MONOABS 0.5 11/11/2020 1320   EOSABS 0.0 11/11/2020 1320   BASOSABS 0.0 11/11/2020 1320    ASSESSMENT AND PLAN: 1. Mandibular pain Persistent mandibular pain.  I question that this may be coming from the dental cavities.  I think the CAT scan would have caught the mandible and the soft tissues in the mouth and no irregularities were seen there to suggest osteomyelitis of the mandible.  We will check a sed rate and C-reactive protein.  We will get a plain x-ray of the mandible.  However I strongly recommend that she sees a dentist.  I have informed her how to apply for the orange card/cone discount.  Once approved she can let me know and we can submit the referral to the dentist. - DG Mandible 1-3 Views; Future - Sedimentation Rate - C-reactive protein - CBC With Differential  Addendum: After patient left I called the radiologist and spoke with Dr. Jeralyn Ruths about the CAT scan of the neck that she had done in June.  I inquired whether it did catch the mandible and soft tissues in the mouth.  He states that it did in  the mandible looked fine.  Patient was given the opportunity to ask questions.  Patient verbalized understanding of the plan and was able to repeat key elements of the plan.   No orders of the defined types were placed in this encounter.    Requested Prescriptions    No prescriptions requested or ordered in this encounter    No follow-ups on file.  Karle Plumber, MD, FACP

## 2021-02-24 NOTE — Progress Notes (Signed)
Pt is having pain in neck and chin area.

## 2021-02-25 LAB — CBC WITH DIFFERENTIAL
Basophils Absolute: 0 10*3/uL (ref 0.0–0.2)
Basos: 0 %
EOS (ABSOLUTE): 0.1 10*3/uL (ref 0.0–0.4)
Eos: 1 %
Hematocrit: 39.5 % (ref 34.0–46.6)
Hemoglobin: 13.9 g/dL (ref 11.1–15.9)
Immature Grans (Abs): 0 10*3/uL (ref 0.0–0.1)
Immature Granulocytes: 0 %
Lymphocytes Absolute: 2.8 10*3/uL (ref 0.7–3.1)
Lymphs: 30 %
MCH: 36.3 pg — ABNORMAL HIGH (ref 26.6–33.0)
MCHC: 35.2 g/dL (ref 31.5–35.7)
MCV: 103 fL — ABNORMAL HIGH (ref 79–97)
Monocytes Absolute: 0.7 10*3/uL (ref 0.1–0.9)
Monocytes: 7 %
Neutrophils Absolute: 5.7 10*3/uL (ref 1.4–7.0)
Neutrophils: 62 %
RBC: 3.83 x10E6/uL (ref 3.77–5.28)
RDW: 13.1 % (ref 11.7–15.4)
WBC: 9.4 10*3/uL (ref 3.4–10.8)

## 2021-02-25 LAB — SEDIMENTATION RATE: Sed Rate: 32 mm/hr (ref 0–32)

## 2021-02-25 LAB — C-REACTIVE PROTEIN: CRP: 1 mg/L (ref 0–10)

## 2021-03-01 ENCOUNTER — Telehealth: Payer: Self-pay

## 2021-03-01 NOTE — Telephone Encounter (Signed)
-----   Message from Ladell Pier, MD sent at 02/26/2021  3:55 PM EDT ----- X-ray of mandible showed mild peridontal disease involving the left lower molar tooth.  Needs to see a dentist.

## 2021-03-01 NOTE — Telephone Encounter (Signed)
Patient was called and a voicemail was left informing patient to return phone call for lab results. 

## 2021-03-01 NOTE — Telephone Encounter (Signed)
-----   Message from Ladell Pier, MD sent at 02/25/2021  4:14 PM EDT ----- Let patient know that her blood test came back okay.  Remind her to get the x-rays done and to apply for the orange card so that we can refer her to the dentist.

## 2021-03-04 ENCOUNTER — Other Ambulatory Visit: Payer: Self-pay | Admitting: Internal Medicine

## 2021-03-04 DIAGNOSIS — K0889 Other specified disorders of teeth and supporting structures: Secondary | ICD-10-CM

## 2021-03-07 ENCOUNTER — Ambulatory Visit: Payer: Self-pay | Admitting: Internal Medicine

## 2021-03-18 ENCOUNTER — Other Ambulatory Visit: Payer: Self-pay | Admitting: Obstetrics and Gynecology

## 2021-03-18 DIAGNOSIS — N939 Abnormal uterine and vaginal bleeding, unspecified: Secondary | ICD-10-CM

## 2021-03-24 ENCOUNTER — Telehealth: Payer: Self-pay

## 2021-03-24 DIAGNOSIS — N939 Abnormal uterine and vaginal bleeding, unspecified: Secondary | ICD-10-CM

## 2021-03-24 MED ORDER — MEDROXYPROGESTERONE ACETATE 10 MG PO TABS: 20.0000 mg | ORAL_TABLET | Freq: Every day | ORAL | 5 refills | Status: DC

## 2021-03-24 NOTE — Telephone Encounter (Signed)
Pt call transferred from the front office with pt requesting to have a refill on her Provera.  Discussed with pt her last visit with Ilda Basset, MD that she was to fill out financial application to get a hysterectomy or IUD application.  Pt reports that she wants to get the surgery but is waiting for the return call back to see if she is approved for financial assistance.  [place] agreed to refill on Provera with 6 month refills.  Medication e-prescribed.    Frances Nickels  03/24/21

## 2021-03-31 ENCOUNTER — Inpatient Hospital Stay: Payer: Self-pay | Attending: Hematology and Oncology | Admitting: Hematology and Oncology

## 2021-03-31 ENCOUNTER — Other Ambulatory Visit: Payer: Self-pay

## 2021-03-31 ENCOUNTER — Encounter: Payer: Self-pay | Admitting: Hematology and Oncology

## 2021-03-31 ENCOUNTER — Inpatient Hospital Stay: Payer: Self-pay

## 2021-03-31 ENCOUNTER — Other Ambulatory Visit: Payer: Self-pay | Admitting: Hematology and Oncology

## 2021-03-31 VITALS — BP 107/79 | HR 111 | Temp 98.9°F | Resp 17 | Ht 71.0 in | Wt 182.1 lb

## 2021-03-31 DIAGNOSIS — Z79899 Other long term (current) drug therapy: Secondary | ICD-10-CM | POA: Insufficient documentation

## 2021-03-31 DIAGNOSIS — D72829 Elevated white blood cell count, unspecified: Secondary | ICD-10-CM | POA: Insufficient documentation

## 2021-03-31 DIAGNOSIS — Z793 Long term (current) use of hormonal contraceptives: Secondary | ICD-10-CM | POA: Insufficient documentation

## 2021-03-31 DIAGNOSIS — D500 Iron deficiency anemia secondary to blood loss (chronic): Secondary | ICD-10-CM

## 2021-03-31 DIAGNOSIS — D75839 Thrombocytosis, unspecified: Secondary | ICD-10-CM

## 2021-03-31 DIAGNOSIS — N920 Excessive and frequent menstruation with regular cycle: Secondary | ICD-10-CM | POA: Insufficient documentation

## 2021-03-31 DIAGNOSIS — I10 Essential (primary) hypertension: Secondary | ICD-10-CM | POA: Insufficient documentation

## 2021-03-31 LAB — FERRITIN: Ferritin: 182 ng/mL (ref 11–307)

## 2021-03-31 LAB — CMP (CANCER CENTER ONLY)
ALT: 9 U/L (ref 0–44)
AST: 17 U/L (ref 15–41)
Albumin: 4.2 g/dL (ref 3.5–5.0)
Alkaline Phosphatase: 56 U/L (ref 38–126)
Anion gap: 10 (ref 5–15)
BUN: 8 mg/dL (ref 6–20)
CO2: 23 mmol/L (ref 22–32)
Calcium: 9.5 mg/dL (ref 8.9–10.3)
Chloride: 107 mmol/L (ref 98–111)
Creatinine: 0.8 mg/dL (ref 0.44–1.00)
GFR, Estimated: >60 mL/min (ref >60)
Glucose, Bld: 121 mg/dL — ABNORMAL HIGH (ref 70–99)
Potassium: 3.7 mmol/L (ref 3.5–5.1)
Sodium: 140 mmol/L (ref 135–145)
Total Bilirubin: 0.3 mg/dL (ref 0.3–1.2)
Total Protein: 7.3 g/dL (ref 6.5–8.1)

## 2021-03-31 LAB — RETIC PANEL
Immature Retic Fract: 23.6 % — ABNORMAL HIGH (ref 2.3–15.9)
RBC.: 3.17 MIL/uL — ABNORMAL LOW (ref 3.87–5.11)
Retic Count, Absolute: 104.6 10*3/uL (ref 19.0–186.0)
Retic Ct Pct: 3.3 % — ABNORMAL HIGH (ref 0.4–3.1)
Reticulocyte Hemoglobin: 38.7 pg (ref >27.9)

## 2021-03-31 LAB — CBC WITH DIFFERENTIAL (CANCER CENTER ONLY)
Abs Immature Granulocytes: 0.05 10*3/uL (ref 0.00–0.07)
Basophils Absolute: 0 10*3/uL (ref 0.0–0.1)
Basophils Relative: 0 %
Eosinophils Absolute: 0.1 10*3/uL (ref 0.0–0.5)
Eosinophils Relative: 1 %
HCT: 33.5 % — ABNORMAL LOW (ref 36.0–46.0)
Hemoglobin: 11.7 g/dL — ABNORMAL LOW (ref 12.0–15.0)
Immature Granulocytes: 1 %
Lymphocytes Relative: 24 %
Lymphs Abs: 2.1 10*3/uL (ref 0.7–4.0)
MCH: 37.6 pg — ABNORMAL HIGH (ref 26.0–34.0)
MCHC: 34.9 g/dL (ref 30.0–36.0)
MCV: 107.7 fL — ABNORMAL HIGH (ref 80.0–100.0)
Monocytes Absolute: 0.7 10*3/uL (ref 0.1–1.0)
Monocytes Relative: 8 %
Neutro Abs: 6 10*3/uL (ref 1.7–7.7)
Neutrophils Relative %: 66 %
Platelet Count: 530 10*3/uL — ABNORMAL HIGH (ref 150–400)
RBC: 3.11 MIL/uL — ABNORMAL LOW (ref 3.87–5.11)
RDW: 14.9 % (ref 11.5–15.5)
WBC Count: 9 10*3/uL (ref 4.0–10.5)
nRBC: 0 % (ref 0.0–0.2)

## 2021-03-31 LAB — IRON AND TIBC
Iron: 62 ug/dL (ref 41–142)
Saturation Ratios: 18 % — ABNORMAL LOW (ref 21–57)
TIBC: 346 ug/dL (ref 236–444)
UIBC: 284 ug/dL (ref 120–384)

## 2021-03-31 NOTE — Progress Notes (Signed)
[place] [phone]   [phone]  PROGRESS NOTE  Patient Care Team: Ladell Pier, MD as PCP - General (Internal Medicine)  Hematological/Oncological History # Thrombocytosis # Leukocytosis #Iron Deficiency Anemia 2/2 to GYN Bleeding 1) 11/26/2010: WBC 16.3, Hgb 15.0, MCV 88.5, Plt 207. First CBC on record 2) 10/13/2015: WBC 8.7, Hgb 12.2, MCV 88.8, Plt 442 3) 07/11/2019: WBC 15.5, Hgb 13.7, MCV 91.7, Plt 842 4) 08/18/2019: WBC 12.5, Hgb 13.5, MCV 90.5, Plt 842 5) 09/17/2019: WBC 10.3, Hgb 11.4, MCV 89, Plt 656 6) 12/03/2019: establish care with Dr. Lorenso Courier  7) 03/10/2020: WBC 10.1, Hgb 11.4, MCV 88.3, Plt 505 8) 06/13/2020: WBC 9.8, Hgb 13.5, MCV 95.2, Plt 427 9) 09/28/2020: WBC 11.0, Hgb 13.2, MCV 95.9, Plt 374 10) 03/31/2021: WBC 9.0, Hgb 11.7, MCV 107.7, Plt 530   Interval History:  Ellen Stone 35 y.o. female with medical history significant for iron deficiency anemia presents for a follow up visit. The patient's last visit was on 09/28/2020. In the interim since the last visit she is had no major changes in her health.  On exam today Ellen Stone reports she has been having worsening tiredness, dizziness, and nausea.  She notes that with the medications given her by OB/GYN her menstrual cycles are under better control.  She reports that her periods last approximately 2 to 3 days with several extra days of spotting.  She notes that she does need a refill on her iron medication today but has missed a few days and doses here and there.  She notes that she does not eat a lot of steak but does try to eat hamburgers and mostly eats a fish-based diet.  She currently denies having any issues with fevers, chills, sweats, nausea, vomiting or diarrhea.  She is willing and able to continue with the p.o. iron therapy at this time.  A full 10 point ROS is listed below.  MEDICAL HISTORY:  Past Medical History:  Diagnosis Date   Chronic bronchitis (HCC)     Cocaine abuse ([place])    History of trichomoniasis 01/27/2020   Hyperphosphatemia 10/20/2015   Hypertension    Low TSH level 09/29/2019   Recurrent UTI (urinary tract infection)    "none lately; since I started drinking more water" (10/18/2015)   Vitamin D deficiency 10/20/2015    SURGICAL HISTORY: Past Surgical History:  Procedure Laterality Date   FRACTURE SURGERY     ORIF PROXIMAL TIBIAL PLATEAU FRACTURE Left 10/18/2015   ORIF TIBIA PLATEAU Left 10/18/2015   Procedure: OPEN REDUCTION INTERNAL FIXATION (ORIF) LEFT TIBIAL PLATEAU;  Surgeon: Altamese Taft, MD;  Location: [facility];  Service: Orthopedics;  Laterality: Left;    SOCIAL HISTORY: Social History   Socioeconomic History   Marital status: Married    Spouse name: Not on file   Number of children: Not on file   Years of education: Not on file   Highest education level: Not on file  Occupational History   Not on file  Tobacco Use   Smoking status: Every Day    Packs/day: 1.00    Years: 10.00    Pack years: 10.00    Types: Cigarettes   Smokeless tobacco: Never  Vaping Use   Vaping Use: Never used  Substance and Sexual Activity   Alcohol use: Yes    Comment: 10/18/2015 "down to maybe 1 beer q 2 weeks or so"   Drug use: Yes    Types: Cocaine, Marijuana    Comment: 10/18/2015 "  quit cocaine recently"   Sexual activity: Not Currently    Birth control/protection: None  Other Topics Concern   Not on file  Social History Narrative   Not on file   Social Determinants of Health   Financial Resource Strain: Not on file  Food Insecurity: Not on file  Transportation Needs: Not on file  Physical Activity: Not on file  Stress: Not on file  Social Connections: Not on file  Intimate Partner Violence: Not on file    FAMILY HISTORY: Family History  Problem Relation Age of Onset   Clotting disorder Mother    Heart Problems Father    Clotting disorder Maternal Uncle     ALLERGIES:  has No Known Allergies.  MEDICATIONS:  Current  Outpatient Medications  Medication Sig Dispense Refill   acetaminophen (TYLENOL) 500 MG tablet Take 5,000 mg by mouth as needed (pain).     amoxicillin (AMOXIL) 500 MG capsule Take 1 capsule (500 mg total) by mouth 3 (three) times daily. (Patient not taking: No sig reported) 21 capsule 0   ferrous sulfate 325 (65 FE) MG EC tablet Take 1 tablet (325 mg total) by mouth daily with breakfast. (Patient not taking: Reported on 02/24/2021) 30 tablet 3   meclizine (ANTIVERT) 12.5 MG tablet Take 1 tablet (12.5 mg total) by mouth 3 (three) times daily as needed for dizziness. (Patient not taking: No sig reported) 30 tablet 0   medroxyPROGESTERone (PROVERA) 10 MG tablet Take 2 tablets (20 mg total) by mouth daily. If bleeding ever becomes heavy, call us 60 tablet 5   naproxen (NAPROSYN) 500 MG tablet Take 1 tablet (500 mg total) by mouth 2 (two) times daily. (Patient not taking: No sig reported) 30 tablet 0   ondansetron (ZOFRAN ODT) 4 MG disintegrating tablet Take 1 tablet (4 mg total) by mouth every 8 (eight) hours as needed for nausea or vomiting. (Patient not taking: No sig reported) 20 tablet 0   No current facility-administered medications for this visit.    REVIEW OF SYSTEMS:   Constitutional: ( - ) fevers, ( - )  chills , ( - ) night sweats Eyes: ( - ) blurriness of vision, ( - ) double vision, ( - ) watery eyes Ears, nose, mouth, throat, and face: ( - ) mucositis, ( - ) sore throat Respiratory: ( - ) cough, ( - ) dyspnea, ( - ) wheezes Cardiovascular: ( - ) palpitation, ( - ) chest discomfort, ( - ) lower extremity swelling Gastrointestinal:  ( - ) nausea, ( - ) heartburn, ( - ) change in bowel habits Skin: ( - ) abnormal skin rashes Lymphatics: ( - ) new lymphadenopathy, ( - ) easy bruising Neurological: ( - ) numbness, ( - ) tingling, ( - ) new weaknesses Behavioral/Psych: ( - ) mood change, ( - ) new changes  All other systems were reviewed with the patient and are negative.  PHYSICAL  EXAMINATION: ECOG PERFORMANCE STATUS: 0 - Asymptomatic  Vitals:   03/31/21 1505  BP: 107/79  Pulse: (!) 111  Resp: 17  Temp: 98.9 F (37.2 C)  SpO2: 100%   Filed Weights   03/31/21 1505  Weight: 182 lb 1.6 oz (82.6 kg)    GENERAL: well appearing young [place] American female. alert, no distress and comfortable SKIN: skin color, texture, turgor are normal, no rashes or significant lesions EYES: conjunctiva are pink and non-injected, sclera clear LUNGS: clear to auscultation and percussion with normal breathing effort HEART: regular rate & rhythm  and no murmurs and no lower extremity edema Musculoskeletal: no cyanosis of digits and no clubbing  PSYCH: alert & oriented x 3, fluent speech NEURO: no focal motor/sensory deficits  LABORATORY DATA:  I have reviewed the data as listed CBC Latest Ref Rng & Units 03/31/2021 02/24/2021 12/15/2020  WBC 4.0 - 10.5 K/uL 9.0 9.4 10.6(H)  Hemoglobin 12.0 - 15.0 g/dL 11.7(L) 13.9 14.1  Hematocrit 36.0 - 46.0 % 33.5(L) 39.5 41.4  Platelets 150 - 400 K/uL 530(H) - 513(H)    CMP Latest Ref Rng & Units 03/31/2021 12/15/2020 11/11/2020  Glucose 70 - 99 mg/dL 121(H) 93 89  BUN 6 - 20 mg/dL '8 10 7  '$ Creatinine 0.44 - 1.00 mg/dL 0.80 0.78 0.77  Sodium 135 - 145 mmol/L 140 134(L) 135  Potassium 3.5 - 5.1 mmol/L 3.7 3.5 4.3  Chloride 98 - 111 mmol/L 107 108 109  CO2 22 - 32 mmol/L 23 16(L) 19(L)  Calcium 8.9 - 10.3 mg/dL 9.5 9.3 8.9  Total Protein 6.5 - 8.1 g/dL 7.3 - 7.2  Total Bilirubin 0.3 - 1.2 mg/dL 0.3 - 0.6  Alkaline Phos 38 - 126 U/L 56 - 45  AST 15 - 41 U/L 17 - 68(H)  ALT 0 - 44 U/L 9 - 60(H)    No results found for: MPROTEIN No results found for: KPAFRELGTCHN, LAMBDASER, KAPLAMBRATIO   RADIOGRAPHIC STUDIES: No results found.  ASSESSMENT & PLAN Ellen Stone 35 y.o. female with medical history significant for iron deficiency anemia presents for a follow up visit.   After review of the labs, the records, discussion with the patient  the findings are consistent with improvement in thrombocytosis and anemia with iron pills.  We recommend that she take these daily from this point forward as unfortunately she was taking them sporadically only on mornings which she was waking up early enough to take them with breakfast.  She is not having any ill effects from the p.o. iron and therefore can continue this. Given her recent drop in Hgb and relatively low iron levels will need consideration of IV iron at this time. Encourage her to continue seeing OB/GYN regarding control of her menstrual bleeding.  # Thrombocytosis, chronic. Resolved # Leukocytosis #Iron Deficiency Anemia 2/2 to GYN Blood loss #Increased MCV --will order CBC, CMP retic panel, and iron studies with ferritin --thrombocytosis is improving, leukocytosis has resolved. Etiology of leukocytosis has been unclear.  --MPN workup with BCR/ABL FISH and JAK2 w/ reflex was negative. Leukocytosis resolved.  --continue PO ferrous sulfate '325mg'$  PO daily with a source of vitamin C.  --patient established with OB/GYN and is on Provera to control menstrual bleeding. --Increased MCV is potentially due to reticulocytosis, however if this is persistent we will need to consider testing for vitamin B12 and folate deficiencies. --if iron levels on today's labs continue to fall despite PO iron therapy can consider IV iron treatment.  --RTC in 3 months.   No orders of the defined types were placed in this encounter.   All questions were answered. The patient knows to call the clinic with any problems, questions or concerns.  A total of more than 30 minutes were spent on this encounter and over half of that time was spent on counseling and coordination of care as outlined above.   Ledell Peoples, MD Department of Hematology/Oncology [place] [facility] Phone: [phone] Pager: [phone] Email: [email or web address]  03/31/2021 4:29 PM

## 2021-04-12 ENCOUNTER — Telehealth: Payer: Self-pay

## 2021-04-12 NOTE — Telephone Encounter (Signed)
Patient called in stating he was to get a hysterectomy . And she needed to get insurance because she was uninsured, she now has family planning medicaid and wanted to know if that was enough to get her started with the process of getting scheduled.   Please call and advise

## 2021-04-12 NOTE — Telephone Encounter (Signed)
Call to patient to review- no answer and no voice mail. Patient is not set-up for My Chart messages.

## 2021-04-17 NOTE — Telephone Encounter (Signed)
Call back to patient- female answers and states patient is not there. Left message to call back to doctor's office.

## 2021-04-18 NOTE — Telephone Encounter (Signed)
Call to patient. No answer and no voice mail.

## 2021-04-19 NOTE — Telephone Encounter (Signed)
Call from patient. Has questions about insurance coverage for hysterectomy and financial aid process. Advised will have financial aid office call her.   Desires to proceed with IUD as interim step to get off pills until can have hysterectomy. Advised will review with physician.

## 2021-05-01 ENCOUNTER — Other Ambulatory Visit: Payer: Self-pay

## 2021-05-01 MED ORDER — LEVONORGESTREL 20 MCG/DAY IU IUD: 1.0000 | INTRAUTERINE_SYSTEM | Freq: Once | INTRAUTERINE | 0 refills | Status: DC

## 2021-05-01 NOTE — Telephone Encounter (Signed)
Aletha Halim, MD  P [facility] Admin Pool; P [facility] Clinical Pool Clinical pool, please order a mirena for this patient and then let the front desk know when it'll arrive so she can have a mirena IUD placement visit with me sometime after that.  Also, please call Ms Gatchel and let her know about the plan. Thanks    Rx sent to local pharmacy and pt to pick up once ready. No answer and VM not set up. No mychart set up either.  Will try calling to pt tomorrow with instructions above.   Colletta Maryland, RN

## 2021-05-01 NOTE — Telephone Encounter (Signed)
Call returned by pt. Spoke with pt.pt given update on Rx Mirena IUD and when to call office to schedule appt. Pt verbalized understanding and agreeable to plan of care.  Colletta Maryland, RN

## 2021-05-16 ENCOUNTER — Telehealth: Payer: Self-pay

## 2021-05-16 NOTE — Telephone Encounter (Signed)
CMA attempt to reach patient regarding if she would still like to proceed with the Mirena.  Patient was not home, family/friend members was inform to tell patient to have patient give the clinic a call back.    Francisco Capuchin, [place] 05/16/2021

## 2021-06-17 ENCOUNTER — Other Ambulatory Visit: Payer: Self-pay

## 2021-06-17 ENCOUNTER — Emergency Department (HOSPITAL_COMMUNITY)
Admission: EM | Admit: 2021-06-17 | Discharge: 2021-06-17 | Disposition: A | Discharge status: Home / Self Care | Payer: Medicaid Other | Attending: Emergency Medicine | Admitting: Emergency Medicine

## 2021-06-17 ENCOUNTER — Encounter (HOSPITAL_COMMUNITY): Payer: Self-pay | Admitting: Emergency Medicine

## 2021-06-17 DIAGNOSIS — I10 Essential (primary) hypertension: Secondary | ICD-10-CM | POA: Insufficient documentation

## 2021-06-17 DIAGNOSIS — R112 Nausea with vomiting, unspecified: Secondary | ICD-10-CM

## 2021-06-17 DIAGNOSIS — F17210 Nicotine dependence, cigarettes, uncomplicated: Secondary | ICD-10-CM | POA: Insufficient documentation

## 2021-06-17 DIAGNOSIS — Z79899 Other long term (current) drug therapy: Secondary | ICD-10-CM | POA: Insufficient documentation

## 2021-06-17 DIAGNOSIS — R197 Diarrhea, unspecified: Secondary | ICD-10-CM | POA: Insufficient documentation

## 2021-06-17 LAB — COMPREHENSIVE METABOLIC PANEL
ALT: 13 U/L (ref 0–44)
AST: 30 U/L (ref 15–41)
Albumin: 4.9 g/dL (ref 3.5–5.0)
Alkaline Phosphatase: 69 U/L (ref 38–126)
Anion gap: 18 — ABNORMAL HIGH (ref 5–15)
BUN: 20 mg/dL (ref 6–20)
CO2: 17 mmol/L — ABNORMAL LOW (ref 22–32)
Calcium: 9.5 mg/dL (ref 8.9–10.3)
Chloride: 95 mmol/L — ABNORMAL LOW (ref 98–111)
Creatinine, Ser: 1.39 mg/dL — ABNORMAL HIGH (ref 0.44–1.00)
GFR, Estimated: 51 mL/min — ABNORMAL LOW (ref >60)
Glucose, Bld: 129 mg/dL — ABNORMAL HIGH (ref 70–99)
Potassium: 3.1 mmol/L — ABNORMAL LOW (ref 3.5–5.1)
Sodium: 130 mmol/L — ABNORMAL LOW (ref 135–145)
Total Bilirubin: 0.8 mg/dL (ref 0.3–1.2)
Total Protein: 8.3 g/dL — ABNORMAL HIGH (ref 6.5–8.1)

## 2021-06-17 LAB — URINALYSIS, ROUTINE W REFLEX MICROSCOPIC
Bacteria, UA: NONE SEEN
Bilirubin Urine: NEGATIVE
Glucose, UA: NEGATIVE mg/dL
Ketones, ur: NEGATIVE mg/dL
Leukocytes,Ua: NEGATIVE
Nitrite: NEGATIVE
Protein, ur: 30 mg/dL — AB
Specific Gravity, Urine: 1.04 — ABNORMAL HIGH (ref 1.005–1.030)
pH: 5 (ref 5.0–8.0)

## 2021-06-17 LAB — CBC
HCT: 42 % (ref 36.0–46.0)
Hemoglobin: 15.5 g/dL — ABNORMAL HIGH (ref 12.0–15.0)
MCH: 37.4 pg — ABNORMAL HIGH (ref 26.0–34.0)
MCHC: 36.9 g/dL — ABNORMAL HIGH (ref 30.0–36.0)
MCV: 101.4 fL — ABNORMAL HIGH (ref 80.0–100.0)
Platelets: 853 10*3/uL — ABNORMAL HIGH (ref 150–400)
RBC: 4.14 MIL/uL (ref 3.87–5.11)
RDW: 13.8 % (ref 11.5–15.5)
WBC: 16.4 10*3/uL — ABNORMAL HIGH (ref 4.0–10.5)
nRBC: 0 % (ref 0.0–0.2)

## 2021-06-17 LAB — LIPASE, BLOOD: Lipase: 53 U/L — ABNORMAL HIGH (ref 11–51)

## 2021-06-17 LAB — I-STAT BETA HCG BLOOD, ED (MC, WL, AP ONLY): I-stat hCG, quantitative: <5 m[IU]/mL (ref <5)

## 2021-06-17 MED ORDER — SODIUM CHLORIDE 0.9 % IV BOLUS
1000.0000 mL | Freq: Once | INTRAVENOUS | Status: AC
  Administered 2021-06-17: 1000 mL via INTRAVENOUS

## 2021-06-17 MED ORDER — POTASSIUM CHLORIDE IN NACL 20-0.9 MEQ/L-% IV SOLN
Freq: Once | INTRAVENOUS | Status: AC
  Filled 2021-06-17: qty 1000

## 2021-06-17 MED ORDER — LORAZEPAM 2 MG/ML IJ SOLN
1.0000 mg | Freq: Once | INTRAMUSCULAR | Status: AC
  Administered 2021-06-17: 1 mg via INTRAVENOUS
  Filled 2021-06-17: qty 1

## 2021-06-17 MED ORDER — ONDANSETRON 4 MG PO TBDP: 4.0000 mg | ORAL_TABLET | Freq: Three times a day (TID) | ORAL | 0 refills | Status: DC | PRN

## 2021-06-17 NOTE — ED Triage Notes (Signed)
C/o nausea and vomiting x 1 week.  Denies pain. Denies diarrhea.

## 2021-06-17 NOTE — ED Provider Notes (Signed)
[facility] EMERGENCY DEPARTMENT Provider Note   CSN: [id number] Arrival date & time: 06/17/21  1334     History Chief Complaint  Patient presents with   Vomiting    Ellen Stone is a 35 y.o. female.  HPI Patient presents with 1 week of nausea, vomiting, diarrhea.  Onset was without obvious precipitant.  Since onset symptoms have been persistent.  No pain, there is associated anorexia.  No fever. No relief with anything, though she notes she is incapable of taking oral intake for relief.  She does note that she smokes marijuana, did so prior to the onset of this illness.    Past Medical History:  Diagnosis Date   Chronic bronchitis (HCC)    Cocaine abuse ([place])    History of trichomoniasis 01/27/2020   Hyperphosphatemia 10/20/2015   Hypertension    Low TSH level 09/29/2019   Recurrent UTI (urinary tract infection)    "none lately; since I started drinking more water" (10/18/2015)   Vitamin D deficiency 10/20/2015    Patient Active Problem List   Diagnosis Date Noted   Transaminitis 01/26/2021   Abnormal uterine bleeding (AUB) 01/26/2021   Hypertension    Adenomyosis 09/29/2019   Thrombocytosis 09/17/2019   Vitamin D deficiency 10/20/2015   Hyperphosphatemia 10/20/2015    Past Surgical History:  Procedure Laterality Date   FRACTURE SURGERY     ORIF PROXIMAL TIBIAL PLATEAU FRACTURE Left 10/18/2015   ORIF TIBIA PLATEAU Left 10/18/2015   Procedure: OPEN REDUCTION INTERNAL FIXATION (ORIF) LEFT TIBIAL PLATEAU;  Surgeon: Altamese Villa Pancho, MD;  Location: [facility];  Service: Orthopedics;  Laterality: Left;     OB History     Gravida  0   Para  0   Term  0   Preterm  0   AB  0   Living  0      SAB  0   IAB  0   Ectopic  0   Multiple  0   Live Births  0           Family History  Problem Relation Age of Onset   Clotting disorder Mother    Heart Problems Father    Clotting disorder Maternal Uncle     Social History   Tobacco Use    Smoking status: Every Day    Packs/day: 1.00    Years: 10.00    Pack years: 10.00    Types: Cigarettes   Smokeless tobacco: Never  Vaping Use   Vaping Use: Never used  Substance Use Topics   Alcohol use: Yes    Comment: 10/18/2015 "down to maybe 1 beer q 2 weeks or so"   Drug use: Yes    Types: Cocaine, Marijuana    Comment: 10/18/2015 "quit cocaine recently"    Home Medications Prior to Admission medications   Medication Sig Start Date End Date Taking? Authorizing Provider  acetaminophen (TYLENOL) 500 MG tablet Take 5,000 mg by mouth as needed (pain).    [provider]  amoxicillin (AMOXIL) 500 MG capsule Take 1 capsule (500 mg total) by mouth 3 (three) times daily. Patient not taking: No sig reported 12/15/20   Malvin Johns, MD  ferrous sulfate 325 (65 FE) MG EC tablet Take 1 tablet (325 mg total) by mouth daily with breakfast. Patient not taking: Reported on 02/24/2021 12/11/19   Orson Slick, MD  levonorgestrel (MIRENA) 20 MCG/DAY IUD 1 each by Intrauterine route once for 1 dose. 05/01/21 05/01/21  Aletha Halim, MD  meclizine (ANTIVERT) 12.5 MG tablet Take 1 tablet (12.5 mg total) by mouth 3 (three) times daily as needed for dizziness. Patient not taking: No sig reported 11/11/20   Henderly, Britni A, PA-C  medroxyPROGESTERone (PROVERA) 10 MG tablet Take 2 tablets (20 mg total) by mouth daily. If bleeding ever becomes heavy, call us 03/24/21 04/23/21  Aletha Halim, MD  naproxen (NAPROSYN) 500 MG tablet Take 1 tablet (500 mg total) by mouth 2 (two) times daily. Patient not taking: No sig reported 11/11/20   Henderly, Britni A, PA-C  ondansetron (ZOFRAN ODT) 4 MG disintegrating tablet Take 1 tablet (4 mg total) by mouth every 8 (eight) hours as needed for nausea or vomiting. Patient not taking: No sig reported 11/11/20   Henderly, Britni A, PA-C    Allergies    Patient has no known allergies.  Review of Systems   Review of Systems  Constitutional:        Per HPI,  otherwise negative  HENT:         Per HPI, otherwise negative  Respiratory:         Per HPI, otherwise negative  Cardiovascular:        Per HPI, otherwise negative  Gastrointestinal:  Positive for diarrhea, nausea and vomiting.  Endocrine:       Negative aside from HPI  Genitourinary:        Neg aside from HPI   Musculoskeletal:        Per HPI, otherwise negative  Skin: Negative.   Neurological:  Negative for syncope.   Physical Exam Updated Vital Signs BP 121/90   Pulse (!) 118   Temp 98.5 F (36.9 C) (Oral)   Resp 14   Ht 5\' 11"  (1.803 m)   SpO2 99%   BMI 25.40 kg/m   Physical Exam Vitals and nursing note reviewed.  Constitutional:      Appearance: She is well-developed. She is ill-appearing and diaphoretic.  HENT:     Head: Normocephalic and atraumatic.  Eyes:     Conjunctiva/sclera: Conjunctivae normal.  Cardiovascular:     Rate and Rhythm: Regular rhythm. Tachycardia present.  Pulmonary:     Effort: Pulmonary effort is normal. No respiratory distress.     Breath sounds: Normal breath sounds. No stridor.  Abdominal:     General: There is no distension.     Tenderness: There is no abdominal tenderness. There is no guarding.  Skin:    General: Skin is warm.  Neurological:     Mental Status: She is alert and oriented to person, place, and time.     Cranial Nerves: No cranial nerve deficit.    ED Results / Procedures / Treatments   Labs (all labs ordered are listed, but only abnormal results are displayed) Labs Reviewed  LIPASE, BLOOD - Abnormal; Notable for the following components:      Result Value   Lipase 53 (*)    All other components within normal limits  COMPREHENSIVE METABOLIC PANEL - Abnormal; Notable for the following components:   Sodium 130 (*)    Potassium 3.1 (*)    Chloride 95 (*)    CO2 17 (*)    Glucose, Bld 129 (*)    Creatinine, Ser 1.39 (*)    Total Protein 8.3 (*)    GFR, Estimated 51 (*)    Anion gap 18 (*)    All other  components within normal limits  CBC - Abnormal; Notable for the following components:  WBC 16.4 (*)    Hemoglobin 15.5 (*)    MCV 101.4 (*)    MCH 37.4 (*)    MCHC 36.9 (*)    Platelets 853 (*)    All other components within normal limits  URINALYSIS, ROUTINE W REFLEX MICROSCOPIC - Abnormal; Notable for the following components:   APPearance HAZY (*)    Specific Gravity, Urine 1.040 (*)    Hgb urine dipstick SMALL (*)    Protein, ur 30 (*)    All other components within normal limits  I-STAT BETA HCG BLOOD, ED ([facility], [facility], [facility] ONLY)    EKG None  Radiology No results found.  Procedures Procedures   Medications Ordered in ED Medications  sodium chloride 0.9 % bolus 1,000 mL (0 mLs Intravenous Stopped 06/17/21 2027)  LORazepam (ATIVAN) injection 1 mg (1 mg Intravenous Given 06/17/21 1703)  0.9 % NaCl with KCl 20 mEq/ L  infusion (0 mL/hr Intravenous Stopped 06/17/21 2027)    ED Course  I have reviewed the triage vital signs and the nursing notes.  Pertinent labs & imaging results that were available during my care of the patient were reviewed by me and considered in my medical decision making (see chart for details).   8:46 PM Patient has received 2 L fluid resuscitation, heart rate has gone from 140s, now 110s she is awake, alert, sitting upright, drinking fluids.  With a soft, nonperitoneal abdomen, no fever, labs notable for trace elevation in lipase, elevation in creatinine, suspicion for dehydration contributing to her tachycardia, possibly secondary to cannabinoid hyperemesis.  Patient amenable to not using marijuana, discharged with further resuscitation as an outpatient, with p.o. intake.  Patient discharged in stable condition.  MDM Rules/Calculators/A&P MDM Number of Diagnoses or Management Options Nausea vomiting and diarrhea: new, needed workup   Amount and/or Complexity of Data Reviewed Clinical lab tests: ordered and reviewed Tests in the medicine section of  CPT: reviewed and ordered Decide to obtain previous medical records or to obtain history from someone other than the patient: yes Review and summarize past medical records: yes Discuss the patient with other providers: yes Independent visualization of images, tracings, or specimens: yes  Risk of Complications, Morbidity, and/or Mortality Presenting problems: high Diagnostic procedures: high Management options: high  Critical Care Total time providing critical care: < 30 minutes  Patient Progress Patient progress: improved   Final Clinical Impression(s) / ED Diagnoses Final diagnoses:  Nausea vomiting and diarrhea    Rx / DC Orders ED Discharge Orders     None        Carmin Muskrat, MD 06/17/21 2048

## 2021-06-17 NOTE — Discharge Instructions (Signed)
Please be sure to stay well-hydrated, and do not hesitate to return here if you develop new, or concerning changes in your condition.

## 2021-06-17 NOTE — ED Notes (Signed)
Patient alerts and oriented x3, ambulatory without assistant, taken her belongings with her and denies missing anything upon leaving the ED/.

## 2021-06-23 ENCOUNTER — Ambulatory Visit (INDEPENDENT_AMBULATORY_CARE_PROVIDER_SITE_OTHER): Payer: Self-pay | Admitting: Obstetrics and Gynecology

## 2021-06-23 ENCOUNTER — Other Ambulatory Visit: Payer: Self-pay

## 2021-06-23 ENCOUNTER — Encounter: Payer: Self-pay | Admitting: Obstetrics and Gynecology

## 2021-06-23 VITALS — BP 103/67 | HR 118 | Wt 172.3 lb

## 2021-06-23 DIAGNOSIS — Z538 Procedure and treatment not carried out for other reasons: Secondary | ICD-10-CM

## 2021-06-23 DIAGNOSIS — Z3009 Encounter for other general counseling and advice on contraception: Secondary | ICD-10-CM

## 2021-06-23 DIAGNOSIS — Z30017 Encounter for initial prescription of implantable subdermal contraceptive: Secondary | ICD-10-CM

## 2021-06-23 LAB — POCT PREGNANCY, URINE: Preg Test, Ur: NEGATIVE

## 2021-06-23 MED ORDER — ETONOGESTREL 68 MG ~~LOC~~ IMPL
68.0000 mg | DRUG_IMPLANT | Freq: Once | SUBCUTANEOUS | Status: AC
  Administered 2021-06-23: 68 mg via SUBCUTANEOUS

## 2021-06-23 NOTE — Progress Notes (Signed)
Obstetrics and Gynecology Visit Return Patient Evaluation  Appointment Date: 06/23/2021  Primary Care Provider: [place], [place] for [facility]  Chief Complaint: contraception management  History of Present Illness:  Ellen Stone is a 35 y.o. G0 with above CC. Patient now has [facility] medicaid and is here for Mirena insertion. PMhx significant for severe adenomyosis, h/o AUB, thrombocytosis, tobacco abuse, h/o STI  Interval History: Since that time, she states that she stopped the provera about a month ago and no current AUB. Pap neg 2021  Review of Systems: as noted in the History of Present Illness.   Patient Active Problem List   Diagnosis Date Noted   Transaminitis 01/26/2021   Abnormal uterine bleeding (AUB) 01/26/2021   Hypertension    Adenomyosis 09/29/2019   Thrombocytosis 09/17/2019   Vitamin D deficiency 10/20/2015   Hyperphosphatemia 10/20/2015   Medications:  None   Allergies: has No Known Allergies.  Physical Exam:  BP 103/67   Pulse (!) 118   Wt 172 lb 4.8 oz (78.2 kg)   BMI 24.03 kg/m  Body mass index is 24.03 kg/m. General appearance: Well nourished, well developed female in no acute distress.  Abdomen: diffusely non tender to palpation, non distended, and no masses, hernias Neuro/Psych:  Normal mood and affect.    Pelvic exam:  EGBUS: normal Vaginal vault: normal Cervix:  normal Bimanual: negative, mid plane Nexplanon placed after unsuccessful Mirena IUD insertion Intrauterine Device (IUD) Insertion Attempt Procedure Note  UPT negative and patient declined STI swab. The patient understands the risks of IUD placement, which include but are not limited to: bleeding, infection, uterine perforation, risk of expulsion, risk of failure < 1%, increased risk of ectopic pregnancy in the event of failure.   Prior to the procedure being performed, the patient (or guardian) was asked to state their full name,  date of birth, and the type of procedure being performed. A bimanual exam showed the uterus to be midposition.  Next, the cervix and vagina were cleaned with an antiseptic solution, and the cervix was grasped with a tenaculum.  The uterus was sounded to 6 cm.  The Mirena was placed without issue but I could se part of the stem in the cervical canal so the IUD was removed and it was intact.   Given this, I discussed with her regarding Nexplanon placement and she was amenable to this. Nexplanon Insertion Procedure Note Prior to the procedure being performed, the patient (or guardian) was asked to state their full name, date of birth, type of procedure being performed and the exact location of the operative site. This information was then checked against the documentation in the patient's chart. Prior to the procedure being performed, a "time out" was performed by the physician that confirmed the correct patient, procedure and site.  After informed consent was obtained, the patient's non-dominant left arm was chosen for insertion. A site was marked approximately 8 cm proximal to the medial epicondyle in the sulcus between the biceps and triceps on the inner surface. The area was cleaned with alcohol then local anesthesia was infiltrated with 3 ml of 1% lidocaine along the planned insertion track. The area was prepped with betadine. Using sterile technique the Nexplanon device was inserted per manufacturer's guidelines in the subdermal connective tissue using the standard insertion technique without difficulty. Pressure was applied and the insertion site was hemostatic. The presence of the Nexplanon was confirmed immediately after insertion by palpation by both me  and the patient and by checking the tip of needle for the absence of the insert.  A pressure dressing was applied.  The patient tolerated the procedure well.  Durene Romans MD Attending Center for [place] ([facility])   Labs: UPT negative  Radiology: Narrative & Impression  CLINICAL DATA:  35 year old female with uterine fibroids and abnormal uterine bleeding, presenting for surgical planning.   EXAM: MRI PELVIS WITHOUT AND WITH CONTRAST   TECHNIQUE: Multiplanar multisequence MR imaging of the pelvis was performed both before and after administration of intravenous contrast.   CONTRAST:  38mL GADAVIST GADOBUTROL 1 MMOL/ML IV SOLN   COMPARISON:  08/18/2019 CT abdomen/pelvis and pelvic sonogram.   FINDINGS: Urinary Tract:  Normal nondistended bladder.  Normal urethra.   Bowel: Visualized small and large bowel are normal caliber with no bowel wall thickening.   Vascular/Lymphatic: No pathologically enlarged lymph nodes in the pelvis. No acute vascular abnormality.   Reproductive:   Uterus: The enlarged retroverted globular uterus measures 10.8 x 7.0 x 8.0 cm. There is marked diffuse thickening of the inner myometrium (junctional zone), particularly in the right fundal uterus, measuring up to 41 mm in thickness. The myometrium is diffusely heterogeneous in signal intensity with numerous small irregular foci of T2 hyperintensity. These findings are compatible with severe diffuse uterine adenomyosis with asymmetric involvement of the right fundal portion of the uterus. No discrete uterine fibroids. Endometrium is distorted by the severe adenomyosis, measuring approximately 5 mm in bilayer thickness, which is within normal limits. No endometrial cavity fluid or focal endometrial mass. Normal uterine cervix and vagina.   Ovaries and Adnexa: The right ovary measures 2.4 x 2.0 x 1.9 cm and is normal, containing a corpus luteum. The left ovary measures 2.9 x 1.9 x 1.5 cm and is normal. There are no suspicious ovarian or adnexal masses.   Other: Small volume simple free fluid in pelvic cul-de-sac. No focal pelvic fluid collection.   Musculoskeletal: No aggressive appearing focal  osseous lesions. Mild degenerative disc disease in the lower lumbar spine.   IMPRESSION: 1. Severe diffuse uterine adenomyosis. No uterine fibroids. No focal endometrial lesions. 2. Normal ovaries. No adnexal masses. 3. Small volume simple free fluid in pelvic cul-de-sac, nonspecific, probably physiologic.     Electronically Signed   By: Ilona Sorrel M.D.   On: 09/27/2019 10:41     Assessment: pt stable  Plan:  1. Nexplanon insertion I suspect that the patient has a very small endometrial cavity with the majority of her uterine size being from the adenomyosis which the MRI appears to show but it is difficult to see but I would say this is confirmed based on her exam today.   RTC: 3 months for follow up  Durene Romans MD Attending Center for [facility])

## 2021-06-25 ENCOUNTER — Encounter: Payer: Self-pay | Admitting: Obstetrics and Gynecology

## 2021-06-30 ENCOUNTER — Inpatient Hospital Stay: Payer: Self-pay | Attending: Hematology and Oncology

## 2021-06-30 ENCOUNTER — Ambulatory Visit: Payer: Self-pay | Admitting: Hematology and Oncology

## 2021-06-30 ENCOUNTER — Other Ambulatory Visit: Payer: Self-pay

## 2021-06-30 ENCOUNTER — Other Ambulatory Visit: Payer: Self-pay | Admitting: Hematology and Oncology

## 2021-06-30 ENCOUNTER — Inpatient Hospital Stay (HOSPITAL_BASED_OUTPATIENT_CLINIC_OR_DEPARTMENT_OTHER): Payer: Self-pay | Admitting: Hematology and Oncology

## 2021-06-30 VITALS — BP 105/81 | HR 116 | Temp 97.3°F | Resp 18 | Wt 177.6 lb

## 2021-06-30 DIAGNOSIS — D500 Iron deficiency anemia secondary to blood loss (chronic): Secondary | ICD-10-CM | POA: Insufficient documentation

## 2021-06-30 DIAGNOSIS — Z79899 Other long term (current) drug therapy: Secondary | ICD-10-CM | POA: Insufficient documentation

## 2021-06-30 DIAGNOSIS — D75839 Thrombocytosis, unspecified: Secondary | ICD-10-CM

## 2021-06-30 DIAGNOSIS — N920 Excessive and frequent menstruation with regular cycle: Secondary | ICD-10-CM | POA: Insufficient documentation

## 2021-06-30 DIAGNOSIS — I10 Essential (primary) hypertension: Secondary | ICD-10-CM | POA: Insufficient documentation

## 2021-06-30 DIAGNOSIS — D72829 Elevated white blood cell count, unspecified: Secondary | ICD-10-CM | POA: Insufficient documentation

## 2021-06-30 DIAGNOSIS — F17210 Nicotine dependence, cigarettes, uncomplicated: Secondary | ICD-10-CM | POA: Insufficient documentation

## 2021-06-30 LAB — CMP (CANCER CENTER ONLY)
ALT: 25 U/L (ref 0–44)
AST: 35 U/L (ref 15–41)
Albumin: 3.4 g/dL — ABNORMAL LOW (ref 3.5–5.0)
Alkaline Phosphatase: 58 U/L (ref 38–126)
Anion gap: 8 (ref 5–15)
BUN: 7 mg/dL (ref 6–20)
CO2: 24 mmol/L (ref 22–32)
Calcium: 8.5 mg/dL — ABNORMAL LOW (ref 8.9–10.3)
Chloride: 108 mmol/L (ref 98–111)
Creatinine: 0.67 mg/dL (ref 0.44–1.00)
GFR, Estimated: >60 mL/min (ref >60)
Glucose, Bld: 89 mg/dL (ref 70–99)
Potassium: 4.2 mmol/L (ref 3.5–5.1)
Sodium: 140 mmol/L (ref 135–145)
Total Bilirubin: <0.2 mg/dL — ABNORMAL LOW (ref 0.3–1.2)
Total Protein: 6.4 g/dL — ABNORMAL LOW (ref 6.5–8.1)

## 2021-06-30 LAB — CBC WITH DIFFERENTIAL (CANCER CENTER ONLY)
Abs Immature Granulocytes: 0.09 10*3/uL — ABNORMAL HIGH (ref 0.00–0.07)
Basophils Absolute: 0 10*3/uL (ref 0.0–0.1)
Basophils Relative: 0 %
Eosinophils Absolute: 0.1 10*3/uL (ref 0.0–0.5)
Eosinophils Relative: 1 %
HCT: 27.6 % — ABNORMAL LOW (ref 36.0–46.0)
Hemoglobin: 9.9 g/dL — ABNORMAL LOW (ref 12.0–15.0)
Immature Granulocytes: 1 %
Lymphocytes Relative: 21 %
Lymphs Abs: 2.5 10*3/uL (ref 0.7–4.0)
MCH: 37.8 pg — ABNORMAL HIGH (ref 26.0–34.0)
MCHC: 35.9 g/dL (ref 30.0–36.0)
MCV: 105.3 fL — ABNORMAL HIGH (ref 80.0–100.0)
Monocytes Absolute: 0.9 10*3/uL (ref 0.1–1.0)
Monocytes Relative: 8 %
Neutro Abs: 8.2 10*3/uL — ABNORMAL HIGH (ref 1.7–7.7)
Neutrophils Relative %: 69 %
Platelet Count: 616 10*3/uL — ABNORMAL HIGH (ref 150–400)
RBC: 2.62 MIL/uL — ABNORMAL LOW (ref 3.87–5.11)
RDW: 17.6 % — ABNORMAL HIGH (ref 11.5–15.5)
WBC Count: 11.8 10*3/uL — ABNORMAL HIGH (ref 4.0–10.5)
nRBC: 0.5 % — ABNORMAL HIGH (ref 0.0–0.2)

## 2021-06-30 LAB — IRON AND TIBC
Iron: 48 ug/dL (ref 41–142)
Saturation Ratios: 16 % — ABNORMAL LOW (ref 21–57)
TIBC: 308 ug/dL (ref 236–444)
UIBC: 259 ug/dL (ref 120–384)

## 2021-06-30 LAB — RETIC PANEL
Immature Retic Fract: 45.9 % — ABNORMAL HIGH (ref 2.3–15.9)
RBC.: 2.71 MIL/uL — ABNORMAL LOW (ref 3.87–5.11)
Retic Count, Absolute: 158 10*3/uL (ref 19.0–186.0)
Retic Ct Pct: 5.8 % — ABNORMAL HIGH (ref 0.4–3.1)
Reticulocyte Hemoglobin: 33.3 pg (ref >27.9)

## 2021-06-30 LAB — FERRITIN: Ferritin: 76 ng/mL (ref 11–307)

## 2021-06-30 MED ORDER — FERROUS SULFATE 325 (65 FE) MG PO TBEC: 325.0000 mg | DELAYED_RELEASE_TABLET | Freq: Every day | ORAL | 3 refills | Status: DC

## 2021-06-30 NOTE — Progress Notes (Signed)
[facility] [phone]   [phone]  PROGRESS NOTE  Patient Care Team: Ladell Pier, MD as PCP - General (Internal Medicine)  Hematological/Oncological History # Thrombocytosis # Leukocytosis #Iron Deficiency Anemia 2/2 to GYN Bleeding 1) 11/26/2010: WBC 16.3, Hgb 15.0, MCV 88.5, Plt 207. First CBC on record 2) 10/13/2015: WBC 8.7, Hgb 12.2, MCV 88.8, Plt 442 3) 07/11/2019: WBC 15.5, Hgb 13.7, MCV 91.7, Plt 842 4) 08/18/2019: WBC 12.5, Hgb 13.5, MCV 90.5, Plt 842 5) 09/17/2019: WBC 10.3, Hgb 11.4, MCV 89, Plt 656 6) 12/03/2019: establish care with Dr. Lorenso Courier  7) 03/10/2020: WBC 10.1, Hgb 11.4, MCV 88.3, Plt 505 8) 06/13/2020: WBC 9.8, Hgb 13.5, MCV 95.2, Plt 427 9) 09/28/2020: WBC 11.0, Hgb 13.2, MCV 95.9, Plt 374 10) 03/31/2021: WBC 9.0, Hgb 11.7, MCV 107.7, Plt 530 11) 06/30/2021: WBC 11.8, Hgb 9.9, MCV 105.3, Plt 616   Interval History:  Ellen Stone 35 y.o. female with medical history significant for iron deficiency anemia presents for a follow up visit. The patient's last visit was on 03/31/2021. In the interim since the last visit she is had no major changes in her health.  On exam today Mrs.Irigoyen reports she has had a rough time in the interim since her last visit.  She continues to have abdominal pain from her adenomyosis.  She reports that she has been taking Tylenol and recently had an IUD placed in order to help with her bleeding.  He notes he was sick for the past week and that she was not able to eat right.  She became nauseated and dehydrated, but fortunately the symptoms have resolved.  She notes that she ran out of ferrous sulfate and therefore has not been taking any iron pills.  She has been doing her best to increase protein intake including hamburger as well as liver.  She notes that overall she feels pretty good and she is not as sluggish as she was before.  She is also not having difficulties with headache.  She denies any shortness  of breath or chest pain.   She currently denies having any issues with fevers, chills, sweats, nausea, vomiting or diarrhea.  She is willing and able to continue with the p.o. iron therapy at this time.  A full 10 point ROS is listed below.  MEDICAL HISTORY:  Past Medical History:  Diagnosis Date   Chronic bronchitis (HCC)    Cocaine abuse ([place])    History of trichomoniasis 01/27/2020   Hyperphosphatemia 10/20/2015   Hypertension    Low TSH level 09/29/2019   Recurrent UTI (urinary tract infection)    "none lately; since I started drinking more water" (10/18/2015)   Transaminitis 01/26/2021   Vitamin D deficiency 10/20/2015    SURGICAL HISTORY: Past Surgical History:  Procedure Laterality Date   FRACTURE SURGERY     ORIF PROXIMAL TIBIAL PLATEAU FRACTURE Left 10/18/2015   ORIF TIBIA PLATEAU Left 10/18/2015   Procedure: OPEN REDUCTION INTERNAL FIXATION (ORIF) LEFT TIBIAL PLATEAU;  Surgeon: Altamese Bailey Lakes, MD;  Location: [facility];  Service: Orthopedics;  Laterality: Left;    SOCIAL HISTORY: Social History   Socioeconomic History   Marital status: Married    Spouse name: Not on file   Number of children: Not on file   Years of education: Not on file   Highest education level: Not on file  Occupational History   Not on file  Tobacco Use   Smoking status: Every Day    Packs/day: 1.00  Years: 10.00    Pack years: 10.00    Types: Cigarettes   Smokeless tobacco: Never  Vaping Use   Vaping Use: Never used  Substance and Sexual Activity   Alcohol use: Yes    Comment: 10/18/2015 "down to maybe 1 beer q 2 weeks or so"   Drug use: Yes    Types: Cocaine, Marijuana    Comment: 10/18/2015 "quit cocaine recently"   Sexual activity: Not Currently    Birth control/protection: None  Other Topics Concern   Not on file  Social History Narrative   Not on file   Social Determinants of Health   Financial Resource Strain: Not on file  Food Insecurity: Not on file  Transportation Needs: Not on file   Physical Activity: Not on file  Stress: Not on file  Social Connections: Not on file  Intimate Partner Violence: Not on file    FAMILY HISTORY: Family History  Problem Relation Age of Onset   Clotting disorder Mother    Heart Problems Father    Clotting disorder Maternal Uncle     ALLERGIES:  has No Known Allergies.  MEDICATIONS:  Current Outpatient Medications  Medication Sig Dispense Refill   acetaminophen (TYLENOL) 500 MG tablet Take 5,000 mg by mouth as needed (pain).     etonogestrel (NEXPLANON) 68 MG IMPL implant 1 each by Subdermal route once.     ferrous sulfate 325 (65 FE) MG EC tablet Take 1 tablet (325 mg total) by mouth daily with breakfast. 90 tablet 3   No current facility-administered medications for this visit.    REVIEW OF SYSTEMS:   Constitutional: ( - ) fevers, ( - )  chills , ( - ) night sweats Eyes: ( - ) blurriness of vision, ( - ) double vision, ( - ) watery eyes Ears, nose, mouth, throat, and face: ( - ) mucositis, ( - ) sore throat Respiratory: ( - ) cough, ( - ) dyspnea, ( - ) wheezes Cardiovascular: ( - ) palpitation, ( - ) chest discomfort, ( - ) lower extremity swelling Gastrointestinal:  ( - ) nausea, ( - ) heartburn, ( - ) change in bowel habits Skin: ( - ) abnormal skin rashes Lymphatics: ( - ) new lymphadenopathy, ( - ) easy bruising Neurological: ( - ) numbness, ( - ) tingling, ( - ) new weaknesses Behavioral/Psych: ( - ) mood change, ( - ) new changes  All other systems were reviewed with the patient and are negative.  PHYSICAL EXAMINATION: ECOG PERFORMANCE STATUS: 0 - Asymptomatic  Vitals:   06/30/21 1038  BP: 105/81  Pulse: (!) 116  Resp: 18  Temp: (!) 97.3 F (36.3 C)  SpO2: 100%    Filed Weights   06/30/21 1038  Weight: 177 lb 9.6 oz (80.6 kg)     GENERAL: well appearing young [place] American female. alert, no distress and comfortable SKIN: skin color, texture, turgor are normal, no rashes or significant  lesions EYES: conjunctiva are pink and non-injected, sclera clear LUNGS: clear to auscultation and percussion with normal breathing effort HEART: regular rate & rhythm and no murmurs and no lower extremity edema Musculoskeletal: no cyanosis of digits and no clubbing  PSYCH: alert & oriented x 3, fluent speech NEURO: no focal motor/sensory deficits  LABORATORY DATA:  I have reviewed the data as listed CBC Latest Ref Rng & Units 06/30/2021 06/17/2021 03/31/2021  WBC 4.0 - 10.5 K/uL 11.8(H) 16.4(H) 9.0  Hemoglobin 12.0 - 15.0 g/dL 9.9(L) 15.5(H) 11.7(L)  Hematocrit 36.0 - 46.0 % 27.6(L) 42.0 33.5(L)  Platelets 150 - 400 K/uL 616(H) 853(H) 530(H)    CMP Latest Ref Rng & Units 06/30/2021 06/17/2021 03/31/2021  Glucose 70 - 99 mg/dL 89 129(H) 121(H)  BUN 6 - 20 mg/dL 7 20 8   Creatinine 0.44 - 1.00 mg/dL 0.67 1.39(H) 0.80  Sodium 135 - 145 mmol/L 140 130(L) 140  Potassium 3.5 - 5.1 mmol/L 4.2 3.1(L) 3.7  Chloride 98 - 111 mmol/L 108 95(L) 107  CO2 22 - 32 mmol/L 24 17(L) 23  Calcium 8.9 - 10.3 mg/dL 8.5(L) 9.5 9.5  Total Protein 6.5 - 8.1 g/dL 6.4(L) 8.3(H) 7.3  Total Bilirubin 0.3 - 1.2 mg/dL <0.2(L) 0.8 0.3  Alkaline Phos 38 - 126 U/L 58 69 56  AST 15 - 41 U/L 35 30 17  ALT 0 - 44 U/L 25 13 9     No results found for: MPROTEIN No results found for: KPAFRELGTCHN, LAMBDASER, KAPLAMBRATIO   RADIOGRAPHIC STUDIES: No results found.  ASSESSMENT & PLAN Ellen Stone 35 y.o. female with medical history significant for iron deficiency anemia presents for a follow up visit.   After review of the labs, the records, discussion with the patient the findings are consistent with improvement in thrombocytosis and anemia with iron pills.  We recommend that she take these daily from this point forward as unfortunately she was taking them sporadically only on mornings which she was waking up early enough to take them with breakfast.  She is not having any ill effects from the p.o. iron and therefore  can continue this. Given her recent drop in Hgb and relatively low iron levels will need consideration of IV iron at this time. Encourage her to continue seeing OB/GYN regarding control of her menstrual bleeding.  # Thrombocytosis, chronic. Resolved # Leukocytosis #Iron Deficiency Anemia 2/2 to GYN Blood loss #Increased MCV --will order CBC, CMP retic panel, and iron studies with ferritin --thrombocytosis was improving, leukocytosis had resolved. Etiology of leukocytosis has been unclear.  --MPN workup with BCR/ABL FISH and JAK2 w/ reflex was negative. Leukocytosis resolved.  --continue PO ferrous sulfate 325mg  PO daily with a source of vitamin C.  --patient established with OB/GYN and recently started an IUD to control menstrual bleeding. --Increased MCV is potentially due to reticulocytosis, however if this is persistent we will need to consider testing for vitamin B12 and folate deficiencies. --if iron levels on today's labs continue to fall despite PO iron therapy can consider IV iron treatment.  --RTC in 3 months.   No orders of the defined types were placed in this encounter.    All questions were answered. The patient knows to call the clinic with any problems, questions or concerns.  A total of more than 30 minutes were spent on this encounter and over half of that time was spent on counseling and coordination of care as outlined above.   Ledell Peoples, MD Department of Hematology/Oncology [place] [facility] Phone: [phone] Pager: [phone] Email: [email or web address]  07/03/2021 1:56 PM

## 2021-07-03 ENCOUNTER — Telehealth: Payer: Self-pay | Admitting: Internal Medicine

## 2021-07-03 NOTE — Telephone Encounter (Signed)
Pt drop up a documents for the provider to sign, Paper will be in the provider inbox

## 2021-07-03 NOTE — Telephone Encounter (Signed)
Form received will place in provider folder

## 2021-07-04 ENCOUNTER — Telehealth: Payer: Self-pay | Admitting: Hematology and Oncology

## 2021-07-04 ENCOUNTER — Ambulatory Visit: Payer: Medicaid Other | Attending: Internal Medicine | Admitting: Internal Medicine

## 2021-07-04 ENCOUNTER — Other Ambulatory Visit: Payer: Self-pay

## 2021-07-04 DIAGNOSIS — E611 Iron deficiency: Secondary | ICD-10-CM

## 2021-07-04 DIAGNOSIS — R000 Tachycardia, unspecified: Secondary | ICD-10-CM

## 2021-07-04 NOTE — Progress Notes (Signed)
Patient ID: Ellen Stone, female   DOB: [birth year], 35 y.o.   MRN: [id number] Virtual Visit via Telephone Note  I connected with Ellen Stone on 07/04/2021 at 1:33 PM by telephone and verified that I am speaking with the correct person using two identifiers  Location: Patient: home Provider: office  Participants: Myself Patient   I discussed the limitations, risks, security and privacy concerns of performing an evaluation and management service by telephone and the availability of in person appointments. I also discussed with the patient that there may be a patient responsible charge related to this service. The patient expressed understanding and agreed to proceed.   History of Present Illness: Patient with history of tobacco dependence, adenomyosis with menorrhagia with regular cycle pelvic pain, thrombocytosis  Patient requested this visit so that I can sign off on form to allow her to donate plasma.  They were concerned about patient's increased pulse rate. -In looking at her flow sheet report in the EMR, I note that her pulse rate has been elevated on her last 5 encounters within the health system.  Pulse ranged from 106-118.  Patient reports that she sometimes feels "hard heartbeats" when she gets up and moving but has not felt it in the past 2 weeks.  She denies feeling of being hot all the time.  She denies any recent weight changes.  However on looking on her flowsheet, I see that between April of this year and 06/30/2021, she is down 23 pounds.  Patient attributes the weight loss to not being able to eat for about 2 weeks from the end of November until mid December when she had a viral illness and was seen in the emergency room.  However I see that the weight loss had started since July.  She has iron deficiency anemia with thrombocytosis.  She has been seeing Dr. Lorenso Stone.  She just started taking iron consistently.  She saw the gynecologist and had Nexplanon placed to help decrease  menstrual bleeding that is thought to cause the iron deficiency.      Outpatient Encounter Medications as of 07/04/2021  Medication Sig   acetaminophen (TYLENOL) 500 MG tablet Take 5,000 mg by mouth as needed (pain).   etonogestrel (NEXPLANON) 68 MG IMPL implant 1 each by Subdermal route once.   ferrous sulfate 325 (65 FE) MG EC tablet Take 1 tablet (325 mg total) by mouth daily with breakfast.   No facility-administered encounter medications on file as of 07/04/2021.      Observations/Objective: Results for orders placed or performed in visit on 06/30/21  Retic Panel  Result Value Ref Range   Retic Ct Pct 5.8 (H) 0.4 - 3.1 %   RBC. 2.71 (L) 3.87 - 5.11 MIL/uL   Retic Count, Absolute 158.0 19.0 - 186.0 K/uL   Immature Retic Fract 45.9 (H) 2.3 - 15.9 %   Reticulocyte Hemoglobin 33.3 >27.9 pg  Iron and TIBC  Result Value Ref Range   Iron 48 41 - 142 ug/dL   TIBC 308 236 - 444 ug/dL   Saturation Ratios 16 (L) 21 - 57 %   UIBC 259 120 - 384 ug/dL  Ferritin  Result Value Ref Range   Ferritin 76 11 - 307 ng/mL  CMP (Cancer Center only)  Result Value Ref Range   Sodium 140 135 - 145 mmol/L   Potassium 4.2 3.5 - 5.1 mmol/L   Chloride 108 98 - 111 mmol/L   CO2 24 22 - 32 mmol/L  Glucose, Bld 89 70 - 99 mg/dL   BUN 7 6 - 20 mg/dL   Creatinine 0.67 0.44 - 1.00 mg/dL   Calcium 8.5 (L) 8.9 - 10.3 mg/dL   Total Protein 6.4 (L) 6.5 - 8.1 g/dL   Albumin 3.4 (L) 3.5 - 5.0 g/dL   AST 35 15 - 41 U/L   ALT 25 0 - 44 U/L   Alkaline Phosphatase 58 38 - 126 U/L   Total Bilirubin <0.2 (L) 0.3 - 1.2 mg/dL   GFR, Estimated >60 >60 mL/min   Anion gap 8 5 - 15  CBC with Differential (Cancer Center Only)  Result Value Ref Range   WBC Count 11.8 (H) 4.0 - 10.5 K/uL   RBC 2.62 (L) 3.87 - 5.11 MIL/uL   Hemoglobin 9.9 (L) 12.0 - 15.0 g/dL   HCT 27.6 (L) 36.0 - 46.0 %   MCV 105.3 (H) 80.0 - 100.0 fL   MCH 37.8 (H) 26.0 - 34.0 pg   MCHC 35.9 30.0 - 36.0 g/dL   RDW 17.6 (H) 11.5 - 15.5 %    Platelet Count 616 (H) 150 - 400 K/uL   nRBC 0.5 (H) 0.0 - 0.2 %   Neutrophils Relative % 69 %   Neutro Abs 8.2 (H) 1.7 - 7.7 K/uL   Lymphocytes Relative 21 %   Lymphs Abs 2.5 0.7 - 4.0 K/uL   Monocytes Relative 8 %   Monocytes Absolute 0.9 0.1 - 1.0 K/uL   Eosinophils Relative 1 %   Eosinophils Absolute 0.1 0.0 - 0.5 K/uL   Basophils Relative 0 %   Basophils Absolute 0.0 0.0 - 0.1 K/uL   Immature Granulocytes 1 %   Abs Immature Granulocytes 0.09 (H) 0.00 - 0.07 K/uL     Assessment and Plan: 1. Iron deficiency Given the anemia and thrombocytosis, I told pt that I recommend getting her blood count up before I would sign off on her donating plasma.  Needs to take the iron supplement consistently as prescribed.  2. Tachycardia May be due to anemia but need to r/o other things including thyroid disease given wgh loss with it.  Will have her come in for my CMA to do EKG. - TSH+T4F+T3Free - EKG 12-Lead   Follow Up Instructions:    I discussed the assessment and treatment plan with the patient. The patient was provided an opportunity to ask questions and all were answered. The patient agreed with the plan and demonstrated an understanding of the instructions.   The patient was advised to call back or seek an in-person evaluation if the symptoms worsen or if the condition fails to improve as anticipated.  I  Spent 10 minutes on this telephone encounter  Karle Plumber, MD

## 2021-07-04 NOTE — Telephone Encounter (Signed)
Scheduled per sch msg. Called and left msg. Mailed printotu

## 2021-07-05 ENCOUNTER — Telehealth: Payer: Self-pay

## 2021-07-05 NOTE — Telephone Encounter (Signed)
Contacted pt to schedule a nurse visit to get an EKG done. Pt didn't answer and was unable to lvm due to vm not being set up

## 2021-08-07 ENCOUNTER — Other Ambulatory Visit: Payer: Self-pay | Admitting: *Deleted

## 2021-08-07 DIAGNOSIS — D500 Iron deficiency anemia secondary to blood loss (chronic): Secondary | ICD-10-CM

## 2021-08-08 ENCOUNTER — Other Ambulatory Visit: Payer: Self-pay

## 2021-08-08 ENCOUNTER — Inpatient Hospital Stay: Payer: Self-pay

## 2021-08-08 ENCOUNTER — Inpatient Hospital Stay: Payer: Self-pay | Attending: Hematology and Oncology

## 2021-08-08 DIAGNOSIS — D500 Iron deficiency anemia secondary to blood loss (chronic): Secondary | ICD-10-CM | POA: Insufficient documentation

## 2021-08-08 DIAGNOSIS — N920 Excessive and frequent menstruation with regular cycle: Secondary | ICD-10-CM | POA: Insufficient documentation

## 2021-08-08 DIAGNOSIS — D75839 Thrombocytosis, unspecified: Secondary | ICD-10-CM | POA: Insufficient documentation

## 2021-08-08 LAB — CBC WITH DIFFERENTIAL (CANCER CENTER ONLY)
Abs Immature Granulocytes: 0.12 10*3/uL — ABNORMAL HIGH (ref 0.00–0.07)
Basophils Absolute: 0 10*3/uL (ref 0.0–0.1)
Basophils Relative: 0 %
Eosinophils Absolute: 0.2 10*3/uL (ref 0.0–0.5)
Eosinophils Relative: 2 %
HCT: 27.3 % — ABNORMAL LOW (ref 36.0–46.0)
Hemoglobin: 8.9 g/dL — ABNORMAL LOW (ref 12.0–15.0)
Immature Granulocytes: 1 %
Lymphocytes Relative: 24 %
Lymphs Abs: 2.8 10*3/uL (ref 0.7–4.0)
MCH: 36 pg — ABNORMAL HIGH (ref 26.0–34.0)
MCHC: 32.6 g/dL (ref 30.0–36.0)
MCV: 110.5 fL — ABNORMAL HIGH (ref 80.0–100.0)
Monocytes Absolute: 0.7 10*3/uL (ref 0.1–1.0)
Monocytes Relative: 6 %
Neutro Abs: 7.8 10*3/uL — ABNORMAL HIGH (ref 1.7–7.7)
Neutrophils Relative %: 67 %
Platelet Count: 496 10*3/uL — ABNORMAL HIGH (ref 150–400)
RBC: 2.47 MIL/uL — ABNORMAL LOW (ref 3.87–5.11)
RDW: 15.9 % — ABNORMAL HIGH (ref 11.5–15.5)
WBC Count: 11.7 10*3/uL — ABNORMAL HIGH (ref 4.0–10.5)
nRBC: 0.9 % — ABNORMAL HIGH (ref 0.0–0.2)

## 2021-08-08 LAB — CMP (CANCER CENTER ONLY)
ALT: 17 U/L (ref 0–44)
AST: 16 U/L (ref 15–41)
Albumin: 3.4 g/dL — ABNORMAL LOW (ref 3.5–5.0)
Alkaline Phosphatase: 46 U/L (ref 38–126)
Anion gap: 6 (ref 5–15)
BUN: 6 mg/dL (ref 6–20)
CO2: 29 mmol/L (ref 22–32)
Calcium: 8.7 mg/dL — ABNORMAL LOW (ref 8.9–10.3)
Chloride: 108 mmol/L (ref 98–111)
Creatinine: 0.57 mg/dL (ref 0.44–1.00)
GFR, Estimated: >60 mL/min (ref >60)
Glucose, Bld: 123 mg/dL — ABNORMAL HIGH (ref 70–99)
Potassium: 3.6 mmol/L (ref 3.5–5.1)
Sodium: 143 mmol/L (ref 135–145)
Total Bilirubin: 0.2 mg/dL — ABNORMAL LOW (ref 0.3–1.2)
Total Protein: 6 g/dL — ABNORMAL LOW (ref 6.5–8.1)

## 2021-08-21 ENCOUNTER — Emergency Department (HOSPITAL_COMMUNITY)
Admission: EM | Admit: 2021-08-21 | Discharge: 2021-08-21 | Disposition: A | Discharge status: Home / Self Care | Payer: Medicaid Other | Attending: Emergency Medicine | Admitting: Emergency Medicine

## 2021-08-21 ENCOUNTER — Other Ambulatory Visit: Payer: Self-pay

## 2021-08-21 ENCOUNTER — Encounter (HOSPITAL_COMMUNITY): Payer: Self-pay

## 2021-08-21 DIAGNOSIS — K047 Periapical abscess without sinus: Secondary | ICD-10-CM | POA: Insufficient documentation

## 2021-08-21 DIAGNOSIS — R000 Tachycardia, unspecified: Secondary | ICD-10-CM | POA: Insufficient documentation

## 2021-08-21 MED ORDER — OXYCODONE HCL 5 MG PO TABS
5.0000 mg | ORAL_TABLET | Freq: Once | ORAL | Status: AC
  Administered 2021-08-21: 5 mg via ORAL
  Filled 2021-08-21: qty 1

## 2021-08-21 MED ORDER — AMOXICILLIN-POT CLAVULANATE 875-125 MG PO TABS: 1.0000 | ORAL_TABLET | Freq: Two times a day (BID) | ORAL | 0 refills | Status: AC

## 2021-08-21 MED ORDER — AMOXICILLIN-POT CLAVULANATE 875-125 MG PO TABS
1.0000 | ORAL_TABLET | Freq: Once | ORAL | Status: AC
  Administered 2021-08-21: 1 via ORAL
  Filled 2021-08-21: qty 1

## 2021-08-21 NOTE — ED Provider Notes (Signed)
RN reported that patient is in the hallway asking why she is not receiving pain medication after reporting that her pain was a 3 after being given oxycodone.  She is in the hallway saying that she wants to leave since nobody is giving her more pain medication.  Patient prescribed antibiotics for her dental infection and will treat the symptoms with naproxen and Tylenol.  Discharged at this time.   Rhae Hammock, PA-C 08/21/21 1121    Godfrey Pick, MD 08/22/21 [phone]

## 2021-08-21 NOTE — Discharge Instructions (Addendum)
Please follow-up with one of the dental office is attached to the discharge papers if you do not have your own.  Naproxen is a good choice for pain control.  You may alternate this with Tylenol if you see fit.    Make sure to take the antibiotics that have been sent to your pharmacy to avoid further infection.  You may start this medication tonight because you received a dose in the emergency department today.

## 2021-08-21 NOTE — ED Provider Notes (Signed)
[facility] EMERGENCY DEPARTMENT Provider Note   CSN: [id number] Arrival date & time: 08/21/21  4315     History  Chief Complaint  Patient presents with   Dental Problem    Ellen Stone is a 36 y.o. female.  HPI Patient presents for pain and swelling in the area of left lower molar.  Onset was last night.  She denies any fevers or chills.  She has not had any difficulty swallowing or any voice changes.  She has known tooth decay in this area.  She has been dealing with ongoing odontogenic issues for several years.  She has not been able to see a dentist.  Patient had a recent hospitalization for an accidental Tylenol overdose.  At that time, she was taking Tylenol for management of her odontogenic pain.  Since then, she has been closely monitoring how much Tylenol she takes in a day.  She has been taking ibuprofen as well.  This morning, she has taken both ibuprofen and Tylenol.  She has recently reached out to some dentist offices but has not been able to establish care due to financial issues.     Home Medications Prior to Admission medications   Medication Sig Start Date End Date Taking? Authorizing Provider  acetaminophen (TYLENOL) 500 MG tablet Take 2,000 mg by mouth 2 (two) times daily as needed (pain).   Yes [provider]  amoxicillin-clavulanate (AUGMENTIN) 875-125 MG tablet Take 1 tablet by mouth every 12 (twelve) hours for 10 days. 08/21/21 08/31/21 Yes Redwine, Madison A, PA-C  etonogestrel (NEXPLANON) 68 MG IMPL implant 68 mg by Subdermal route once. Implanted June 23, 2021 06/23/21  Yes [provider]  ferrous sulfate 325 (65 FE) MG EC tablet Take 1 tablet (325 mg total) by mouth daily with breakfast. 06/30/21  Yes Orson Slick, MD      Allergies    Patient has no known allergies.    Review of Systems   Review of Systems  HENT:  Positive for dental problem and facial swelling.   All other systems reviewed and are  negative.  Physical Exam Updated Vital Signs BP (!) 119/97 (BP Location: Left Arm)    Pulse (!) 103    Temp 98.5 F (36.9 C) (Oral)    Resp 17    Ht 5\' 11"  (1.803 m)    Wt 80.6 kg    SpO2 100%    BMI 24.78 kg/m  Physical Exam Vitals and nursing note reviewed.  Constitutional:      General: She is not in acute distress.    Appearance: Normal appearance. She is well-developed and normal weight. She is not ill-appearing, toxic-appearing or diaphoretic.  HENT:     Head: Normocephalic and atraumatic.     Right Ear: External ear normal.     Left Ear: External ear normal.     Nose: Nose normal.     Mouth/Throat:     Comments: Odontogenic decay and area of left lower molar.  Small amount of associated swelling.  No areas of induration or fluctuance.  No trismus.  Oropharynx is patent.  No areas of sublingual edema. Eyes:     General: No scleral icterus.    Extraocular Movements: Extraocular movements intact.     Conjunctiva/sclera: Conjunctivae normal.  Cardiovascular:     Rate and Rhythm: Normal rate and regular rhythm.     Heart sounds: No murmur heard. Pulmonary:     Effort: Pulmonary effort is normal. No respiratory  distress.  Abdominal:     General: Abdomen is flat.     Palpations: Abdomen is soft.  Musculoskeletal:        General: No swelling. Normal range of motion.     Cervical back: Normal range of motion and neck supple.  Skin:    General: Skin is warm and dry.     Capillary Refill: Capillary refill takes less than 2 seconds.     Coloration: Skin is not jaundiced or pale.  Neurological:     General: No focal deficit present.     Mental Status: She is alert and oriented to person, place, and time.     Cranial Nerves: No cranial nerve deficit.     Sensory: No sensory deficit.     Motor: No weakness.     Coordination: Coordination normal.  Psychiatric:        Mood and Affect: Mood normal.        Behavior: Behavior normal.    ED Results / Procedures / Treatments    Labs (all labs ordered are listed, but only abnormal results are displayed) Labs Reviewed - No data to display  EKG None  Radiology No results found.  Procedures Procedures    Medications Ordered in ED Medications  oxyCODONE (Oxy IR/ROXICODONE) immediate release tablet 5 mg (5 mg Oral Given 08/21/21 1011)  amoxicillin-clavulanate (AUGMENTIN) 875-125 MG per tablet 1 tablet (1 tablet Oral Given 08/21/21 1011)    ED Course/ Medical Decision Making/ A&P                           Medical Decision Making Risk Prescription drug management.   36 year old female presenting for pain and swelling in area of left lower molar.  Differential diagnosis includes odontogenic infection, periodontal abscess, Ludewig's angina.  Her medical history is notable for ongoing tooth decay.  She has not seen a dentist in several years.  She had a recent hospitalization for an accidental Tylenol overdose.  She was taken Tylenol at that time for treatment of odontogenic pain.  She has since been taking NSAIDs and limiting her use of Tylenol.  Prior to arrival in the ED, she has taken NSAIDs and Tylenol for management of pain.  She has not been on any antibiotics recently.  On arrival in the ED, she is afebrile.  Her vital signs are notable for tachycardia.  I suspect this is secondary to pain, although patient states that her baseline heart rate is in the range of 110.  She is well-appearing on exam.  There is a mild area of swelling in the left lower molar area.  This molar area does appear decayed.  I am not able to appreciate any areas of fluctuance or induration.  Sublingual area is soft and nonswollen.  Patient does not have trismus, muffled voice, or difficulty swallowing.  Patient was counseled on need for dental follow-up for management of this issue.  She was given dose of Roxicodone for analgesia.  She was given her initial dose of antibiotic in the ED.  Patient's heart rate returned to baseline following pain  control.  Prescription for antibiotics was provided.  Patient was discharged in stable condition.        Final Clinical Impression(s) / ED Diagnoses Final diagnoses:  Dental infection    Rx / DC Orders ED Discharge Orders          Ordered    amoxicillin-clavulanate (AUGMENTIN) 875-125 MG tablet  Every  12 hours        08/21/21 1119              Godfrey Pick, MD 08/22/21 [phone]

## 2021-08-21 NOTE — ED Triage Notes (Signed)
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  Swelling noted

## 2021-08-23 NOTE — Progress Notes (Signed)
Patient ID: Ellen Stone, female   DOB: 1985-08-06, 36 y.o.   MRN: [id number]    Ellen Stone, is a 36 y.o. female  FMB:[id number]  [id number]  DOB - 28-Jul-1985  Chief Complaint  Patient presents with   Hospitalization Follow-up       Subjective:   Ellen Stone is a 36 y.o. female here today for a follow up visit After ED visit 08/21/2021 and recent hospitalization prior to that for accidental tylenol overdose.  She is doing well.  Still on antibiotics for abscess.  Tooth pain is still there but definitely improving.  Tachycardia is long-standing and "normal" for her.  No FH heart problems that she knows of.  She admits to poor water intake.  Takes iron once daily for known iron deficiency anemia.  No TSH on file.  When asked about anxiety she says she isn't sure if she feels anxious, but she feels like her husband stresses her out.  +smoker.  She denies SOB/CP  From 36/44 A/P: 36 year old female presenting for pain and swelling in area of left lower molar.  Differential diagnosis includes odontogenic infection, periodontal abscess, Ludewig's angina.  Her medical history is notable for ongoing tooth decay.  She has not seen a dentist in several years.  She had a recent hospitalization for an accidental Tylenol overdose.  She was taken Tylenol at that time for treatment of odontogenic pain.  She has since been taking NSAIDs and limiting her use of Tylenol.  Prior to arrival in the ED, she has taken NSAIDs and Tylenol for management of pain.  She has not been on any antibiotics recently.  On arrival in the ED, she is afebrile.  Her vital signs are notable for tachycardia.  I suspect this is secondary to pain, although patient states that her baseline heart rate is in the range of 110.  She is well-appearing on exam.  There is a mild area of swelling in the left lower molar area.  This molar area does appear decayed.  I am not able to appreciate any areas of fluctuance or induration.  Sublingual area is  soft and nonswollen.  Patient does not have trismus, muffled voice, or difficulty swallowing.  Patient was counseled on need for dental follow-up for management of this issue.  She was given dose of Roxicodone for analgesia.  She was given her initial dose of antibiotic in the ED   Patient has No headache, No chest pain, No abdominal pain - No Nausea, No new weakness tingling or numbness, No Cough - SOB.  No problems updated.  ALLERGIES: No Known Allergies  PAST MEDICAL HISTORY: Past Medical History:  Diagnosis Date   Chronic bronchitis (HCC)    Cocaine abuse ([place])    History of trichomoniasis 01/27/2020   Hyperphosphatemia 10/20/2015   Hypertension    Low TSH level 09/29/2019   Recurrent UTI (urinary tract infection)    "none lately; since I started drinking more water" (10/18/2015)   Transaminitis 01/26/2021   Vitamin D deficiency 10/20/2015    MEDICATIONS AT HOME: Prior to Admission medications   Medication Sig Start Date End Date Taking? Authorizing Provider  amoxicillin-clavulanate (AUGMENTIN) 875-125 MG tablet Take 1 tablet by mouth every 12 (twelve) hours for 10 days. 08/21/21 08/31/21 Yes Redwine, Madison A, PA-C  etonogestrel (NEXPLANON) 68 MG IMPL implant 68 mg by Subdermal route once. Implanted June 23, 2021 06/23/21  Yes [provider]  ferrous sulfate 325 (65 FE) MG EC tablet Take 1 tablet (325  mg total) by mouth daily with breakfast. 06/30/21  Yes Orson Slick, MD  ibuprofen (ADVIL) 200 MG tablet Take 200 mg by mouth every 6 (six) hours as needed.   Yes [provider]  acetaminophen (TYLENOL) 500 MG tablet Take 2,000 mg by mouth 2 (two) times daily as needed (pain). Patient not taking: Reported on 08/24/2021    [provider]    ROS: Neg HEENT Neg resp Neg cardiac Neg GI Neg GU Neg MS Neg psych Neg neuro  Objective:   Vitals:   08/24/21 1525  BP: 118/89  Pulse: (!) 124  SpO2: 100%  Weight: 169 lb 8 oz (76.9 kg)  Height: 5'  11" (1.803 m)   Exam General appearance : Awake, alert, not in any distress. Speech Clear. Not toxic looking;  fidgets, leg tapping HEENT: Atraumatic and Normocephalic Neck: Supple, no JVD. No cervical lymphadenopathy.  Chest: Good air entry bilaterally, CTAB.  No rales/rhonchi/wheezing CVS: S1 S2 regular, no murmurs. Rate is about 110 on exam Extremities: B/L Lower Ext shows no edema, both legs are warm to touch Neurology: Awake alert, and oriented X 3, CN II-XII intact, Non focal Skin: No Rash  Data Review No results found for: HGBA1C  Assessment & Plan   1. Vitamin D deficiency - Vitamin D, 25-hydroxy  2. Tachycardia Unknown etiology-cut back/preferably quit nicotine/stimulants. Drink 80-100 ounces water daily - Thyroid Panel With TSH  3. Tooth abscess Complete antibiotics - Ambulatory referral to Dentistry  4. Encounter for examination following treatment at hospital Doing well  5. Anemia, unspecified type Only taking iron once daily - CBC with Differential/Platelet  6. Hyperglycemia I have had a lengthy discussion and provided education about insulin resistance and the intake of too much sugar/refined carbohydrates.  I have advised the patient to work at a goal of eliminating sugary drinks, candy, desserts, sweets, refined sugars, processed foods, and white carbohydrates.  The patient expresses understanding.  - Hemoglobin A1c  Consider SSRI or medication like buspar.  She deferred today on this   Patient have been counseled extensively about nutrition and exercise. Other issues discussed during this visit include: low cholesterol diet, weight control and daily exercise, foot care, annual eye examinations at Ophthalmology, importance of adherence with medications and regular follow-up. We also discussed long term complications of uncontrolled diabetes and hypertension.   Return in about 3 months (around 11/21/2021) for with Dr Wynetta Emery.  The patient was given clear  instructions to go to ER or return to medical center if symptoms don't improve, worsen or new problems develop. The patient verbalized understanding. The patient was told to call to get lab results if they haven't heard anything in the next week.      Freeman Caldron, PA-C [facility] and [facility] [place], [place]   08/24/2021, 4:05 PM .

## 2021-08-24 ENCOUNTER — Encounter: Payer: Self-pay | Admitting: Physician Assistant

## 2021-08-24 ENCOUNTER — Ambulatory Visit: Payer: Self-pay | Attending: Physician Assistant | Admitting: Physician Assistant

## 2021-08-24 VITALS — BP 118/89 | HR 124 | Ht 71.0 in | Wt 169.5 lb

## 2021-08-24 DIAGNOSIS — K047 Periapical abscess without sinus: Secondary | ICD-10-CM

## 2021-08-24 DIAGNOSIS — Z09 Encounter for follow-up examination after completed treatment for conditions other than malignant neoplasm: Secondary | ICD-10-CM

## 2021-08-24 DIAGNOSIS — E559 Vitamin D deficiency, unspecified: Secondary | ICD-10-CM

## 2021-08-24 DIAGNOSIS — R739 Hyperglycemia, unspecified: Secondary | ICD-10-CM

## 2021-08-24 DIAGNOSIS — R000 Tachycardia, unspecified: Secondary | ICD-10-CM

## 2021-08-24 DIAGNOSIS — D649 Anemia, unspecified: Secondary | ICD-10-CM

## 2021-08-24 NOTE — Patient Instructions (Signed)
Drink 80-100 ounces water daily 

## 2021-08-25 LAB — CBC WITH DIFFERENTIAL/PLATELET
Basophils Absolute: 0.1 10*3/uL (ref 0.0–0.2)
Basos: 0 %
EOS (ABSOLUTE): 0.1 10*3/uL (ref 0.0–0.4)
Eos: 1 %
Hematocrit: 36.2 % (ref 34.0–46.6)
Hemoglobin: 12.3 g/dL (ref 11.1–15.9)
Immature Grans (Abs): 0.1 10*3/uL (ref 0.0–0.1)
Immature Granulocytes: 1 %
Lymphocytes Absolute: 1.9 10*3/uL (ref 0.7–3.1)
Lymphs: 16 %
MCH: 35.4 pg — ABNORMAL HIGH (ref 26.6–33.0)
MCHC: 34 g/dL (ref 31.5–35.7)
MCV: 104 fL — ABNORMAL HIGH (ref 79–97)
Monocytes Absolute: 0.9 10*3/uL (ref 0.1–0.9)
Monocytes: 8 %
Neutrophils Absolute: 8.7 10*3/uL — ABNORMAL HIGH (ref 1.4–7.0)
Neutrophils: 74 %
Platelets: 555 10*3/uL — ABNORMAL HIGH (ref 150–450)
RBC: 3.47 x10E6/uL — ABNORMAL LOW (ref 3.77–5.28)
RDW: 12.5 % (ref 11.7–15.4)
WBC: 11.8 10*3/uL — ABNORMAL HIGH (ref 3.4–10.8)

## 2021-08-25 LAB — THYROID PANEL WITH TSH
Free Thyroxine Index: 1.2 (ref 1.2–4.9)
T3 Uptake Ratio: 26 % (ref 24–39)
T4, Total: 4.5 ug/dL (ref 4.5–12.0)
TSH: 0.326 u[IU]/mL — ABNORMAL LOW (ref 0.450–4.500)

## 2021-08-25 LAB — HEMOGLOBIN A1C
Est. average glucose Bld gHb Est-mCnc: 88 mg/dL
Hgb A1c MFr Bld: 4.7 % — ABNORMAL LOW (ref 4.8–5.6)

## 2021-08-25 LAB — VITAMIN D 25 HYDROXY (VIT D DEFICIENCY, FRACTURES): Vit D, 25-Hydroxy: 21 ng/mL — ABNORMAL LOW (ref 30.0–100.0)

## 2021-08-30 ENCOUNTER — Other Ambulatory Visit: Payer: Self-pay | Admitting: Physician Assistant

## 2021-08-30 DIAGNOSIS — E559 Vitamin D deficiency, unspecified: Secondary | ICD-10-CM

## 2021-08-30 DIAGNOSIS — E0590 Thyrotoxicosis, unspecified without thyrotoxic crisis or storm: Secondary | ICD-10-CM

## 2021-08-30 MED ORDER — METHIMAZOLE 5 MG PO TABS: 5.0000 mg | ORAL_TABLET | Freq: Two times a day (BID) | ORAL | 3 refills | Status: DC

## 2021-08-30 MED ORDER — VITAMIN D (ERGOCALCIFEROL) 1.25 MG (50000 UNIT) PO CAPS: 50000.0000 [IU] | ORAL_CAPSULE | ORAL | 0 refills | Status: DC

## 2021-09-07 ENCOUNTER — Ambulatory Visit: Payer: Self-pay

## 2021-09-07 NOTE — Telephone Encounter (Signed)
°  Chief Complaint: sore throat Symptoms: sore throat only when laying down Frequency: 1-2 days Pertinent Negatives: Patient denies fever, cough, congestion, SOB Disposition: [] ED /[] Urgent Care (no appt availability in office) / [] Appointment(In office/virtual)/ []  [facility]/ [x] Home Care/ [] Refused Recommended Disposition /[] [facility]/ []  Follow-up with PCP Additional Notes: advised pt to try some Cepacol throat lozenges to soothe sore throat and if symptoms persist to call back. Pt verbalized understanding.    Pt called in stating she has been having a sore throat and wanted to speak with a nurse, there were no available appts until 03/27, please advise.  Reason for Disposition  [1] Sore throat is the only symptom AND [2] sore throat present < 48 hours  Answer Assessment - Initial Assessment Questions 1. ONSET: "When did the throat start hurting?" (Hours or days ago)      1-2 days 2. SEVERITY: "How bad is the sore throat?" (Scale 1-10; mild, moderate or severe)   - MILD (1-3):  doesn't interfere with eating or normal activities   - MODERATE (4-7): interferes with eating some solids and normal activities   - SEVERE (8-10):  excruciating pain, interferes with most normal activities   - SEVERE DYSPHAGIA: can't swallow liquids, drooling     discomfort 3. STREP EXPOSURE: "Has there been any exposure to strep within the past week?" If Yes, ask: "What type of contact occurred?"      No 4.  VIRAL SYMPTOMS: "Are there any symptoms of a cold, such as a runny nose, cough, hoarse voice or red eyes?"      No 5. FEVER: "Do you have a fever?" If Yes, ask: "What is your temperature, how was it measured, and when did it start?"     No 6. PUS ON THE TONSILS: "Is there pus on the tonsils in the back of your throat?"     no 7. OTHER SYMPTOMS: "Do you have any other symptoms?" (e.g., difficulty breathing, headache, rash)     no  Protocols used: Sore Throat-A-AH

## 2021-09-14 ENCOUNTER — Telehealth: Payer: Self-pay | Admitting: Hematology and Oncology

## 2021-09-14 NOTE — Telephone Encounter (Signed)
R/s per provider pal, pt has been called and confirmed  ?

## 2021-09-25 ENCOUNTER — Other Ambulatory Visit: Payer: Self-pay | Admitting: Physician Assistant

## 2021-09-25 DIAGNOSIS — D500 Iron deficiency anemia secondary to blood loss (chronic): Secondary | ICD-10-CM

## 2021-09-26 ENCOUNTER — Inpatient Hospital Stay: Payer: Self-pay | Admitting: Physician Assistant

## 2021-09-26 ENCOUNTER — Inpatient Hospital Stay: Payer: Self-pay

## 2021-10-04 ENCOUNTER — Inpatient Hospital Stay: Payer: Self-pay

## 2021-10-04 ENCOUNTER — Inpatient Hospital Stay: Payer: Self-pay | Admitting: Hematology and Oncology

## 2021-10-09 ENCOUNTER — Other Ambulatory Visit: Payer: Self-pay

## 2021-10-09 ENCOUNTER — Encounter: Payer: Self-pay | Admitting: Family Medicine

## 2021-10-09 ENCOUNTER — Ambulatory Visit: Payer: Medicaid Other | Attending: Family Medicine | Admitting: Family Medicine

## 2021-10-09 VITALS — BP 115/81 | HR 91 | Ht 71.0 in | Wt 176.6 lb

## 2021-10-09 DIAGNOSIS — J328 Other chronic sinusitis: Secondary | ICD-10-CM

## 2021-10-09 MED ORDER — FLUTICASONE PROPIONATE 50 MCG/ACT NA SUSP: 2.0000 | Freq: Every day | NASAL | 1 refills | Status: DC

## 2021-10-09 MED ORDER — CETIRIZINE HCL 10 MG PO TABS: 10.0000 mg | ORAL_TABLET | Freq: Every day | ORAL | 1 refills | Status: DC

## 2021-10-09 NOTE — Patient Instructions (Signed)
Postnasal Drip ?Postnasal drip is the feeling of mucus going down the back of your throat. Mucus is a slimy substance that moistens and cleans your nose and throat, as well as the air pockets in face bones near your forehead and cheeks (sinuses). Small amounts of mucus pass from your nose and sinuses down the back of your throat all the time. This is normal. When you produce too much mucus or the mucus gets too thick, you can feel it. ?Some common causes of postnasal drip include: ?Having more mucus because of: ?A cold or the flu. ?Allergies. ?Cold air. ?Certain medicines. ?Having more mucus that is thicker because of: ?A sinus or nasal infection. ?Dry air. ?A food allergy. ?Follow these instructions at home: ?Relieving discomfort ? ?Gargle with a salt-water mixture 3-4 times a day or as needed. To make a salt-water mixture, completely dissolve ?-1 tsp of salt in 1 cup of warm water. ?If the air in your home is dry, use a humidifier to add moisture to the air. ?Use a saline spray or container (neti pot) to flush out the nose (nasal irrigation). These methods can help clear away mucus and keep the nasal passages moist. ?General instructions ?Take over-the-counter and prescription medicines only as told by your health care provider. ?Follow instructions from your health care provider about eating or drinking restrictions. You may need to avoid caffeine. ?Avoid things that you know you are allergic to (allergens), like dust, mold, pollen, pets, or certain foods. ?Drink enough fluid to keep your urine pale yellow. ?Keep all follow-up visits as told by your health care provider. This is important. ?Contact a health care provider if: ?You have a fever. ?You have a sore throat. ?You have difficulty swallowing. ?You have headache. ?You have sinus pain. ?You have a cough that does not go away. ?The mucus from your nose becomes thick and is green or yellow in color. ?You have cold or flu symptoms that last more than 10  days. ?Summary ?Postnasal drip is the feeling of mucus going down the back of your throat. ?If your health care provider approves, use nasal irrigation or a nasal spray 2?4 times a day. ?Avoid things that you know you are allergic to (allergens), like dust, mold, pollen, pets, or certain foods. ?This information is not intended to replace advice given to you by your health care provider. Make sure you discuss any questions you have with your health care provider. ?Document Revised: 04/12/2020 Document Reviewed: 04/12/2020 ?Elsevier Patient Education ? 2022 [employer] ? ?

## 2021-10-09 NOTE — Progress Notes (Signed)
? ?Subjective:  ?Patient ID: Ellen Stone, female    DOB: 1986/06/14  Age: 36 y.o. MRN: [id number] ? ?CC: Sore Throat ? ? ?HPI ?Ellen Stone is a 36 y.o. year old female patient of Dr. Wynetta Emery who presents today for an acute visit. ?She had visit for chronic disease management last month with the physician assistant. ? ?Interval History: ?Today she complains of sore throat which is on the R side of her throat. ?Pain occurs inside her throat; she states it is not 'a sore throat' but is a pain and is intermittent  and has been present for months. Clearing her throat helps; she has R maxillary pain as well.  Denies presence of tearing of the eyes but endorses sense of fullness in the ears. ?She takes Ibuprofen for her symptoms with mild relief. ?Past Medical History:  ?Diagnosis Date  ? Chronic bronchitis ([place])   ? Cocaine abuse ([place])   ? History of trichomoniasis 01/27/2020  ? Hyperphosphatemia 10/20/2015  ? Hypertension   ? Low TSH level 09/29/2019  ? Recurrent UTI (urinary tract infection)   ? "none lately; since I started drinking more water" (10/18/2015)  ? Transaminitis 01/26/2021  ? Vitamin D deficiency 10/20/2015  ? ? ?Past Surgical History:  ?Procedure Laterality Date  ? FRACTURE SURGERY    ? ORIF PROXIMAL TIBIAL PLATEAU FRACTURE Left 10/18/2015  ? ORIF TIBIA PLATEAU Left 10/18/2015  ? Procedure: OPEN REDUCTION INTERNAL FIXATION (ORIF) LEFT TIBIAL PLATEAU;  Surgeon: Altamese [place], MD;  Location: [facility];  Service: Orthopedics;  Laterality: Left;  ? ? ?Family History  ?Problem Relation Age of Onset  ? Clotting disorder Mother   ? Heart Problems Father   ? Clotting disorder Maternal Uncle   ? ? ?Social History  ? ?Socioeconomic History  ? Marital status: Married  ?  Spouse name: Not on file  ? Number of children: Not on file  ? Years of education: Not on file  ? Highest education level: Not on file  ?Occupational History  ? Not on file  ?Tobacco Use  ? Smoking status: Every Day  ?  Packs/day: 1.00  ?  Years: 10.00  ?  Pack  years: 10.00  ?  Types: Cigarettes  ? Smokeless tobacco: Never  ?Vaping Use  ? Vaping Use: Never used  ?Substance and Sexual Activity  ? Alcohol use: Yes  ?  Comment: 10/18/2015 "down to maybe 1 beer q 2 weeks or so"  ? Drug use: Yes  ?  Types: Cocaine, Marijuana  ?  Comment: 10/18/2015 "quit cocaine recently"  ? Sexual activity: Not Currently  ?  Birth control/protection: None  ?Other Topics Concern  ? Not on file  ?Social History Narrative  ? Not on file  ? ?Social Determinants of Health  ? ?Financial Resource Strain: Not on file  ?Food Insecurity: Not on file  ?Transportation Needs: Not on file  ?Physical Activity: Not on file  ?Stress: Not on file  ?Social Connections: Not on file  ? ? ?No Known Allergies ? ?Outpatient Medications Prior to Visit  ?Medication Sig Dispense Refill  ? acetaminophen (TYLENOL) 500 MG tablet Take 2,000 mg by mouth 2 (two) times daily as needed (pain).    ? etonogestrel (NEXPLANON) 68 MG IMPL implant 68 mg by Subdermal route once. Implanted June 23, 2021    ? ferrous sulfate 325 (65 FE) MG EC tablet Take 1 tablet (325 mg total) by mouth daily with breakfast. 90 tablet 3  ? ibuprofen (ADVIL) 200  MG tablet Take 200 mg by mouth every 6 (six) hours as needed.    ? methimazole (TAPAZOLE) 5 MG tablet Take 1 tablet (5 mg total) by mouth 2 (two) times daily. 60 tablet 3  ? Vitamin D, Ergocalciferol, (DRISDOL) 1.25 MG (50000 UNIT) CAPS capsule Take 1 capsule (50,000 Units total) by mouth every 7 (seven) days. 16 capsule 0  ? ?No facility-administered medications prior to visit.  ? ? ? ?ROS ?Review of Systems  ?Constitutional:  Negative for activity change, appetite change and fatigue.  ?HENT:  Positive for postnasal drip, sinus pain and sore throat. Negative for congestion and sinus pressure.   ?Eyes:  Negative for visual disturbance.  ?Respiratory:  Negative for cough, chest tightness, shortness of breath and wheezing.   ?Cardiovascular:  Negative for chest pain and palpitations.   ?Gastrointestinal:  Negative for abdominal distention, abdominal pain and constipation.  ?Endocrine: Negative for polydipsia.  ?Genitourinary:  Negative for dysuria and frequency.  ?Musculoskeletal:  Negative for arthralgias and back pain.  ?Skin:  Negative for rash.  ?Neurological:  Negative for tremors, light-headedness and numbness.  ?Hematological:  Does not bruise/bleed easily.  ?Psychiatric/Behavioral:  Negative for agitation and behavioral problems.   ? ?Objective:  ?BP 115/81   Pulse 91   Ht '5\' 11"'$  (1.803 m)   Wt 176 lb 9.6 oz (80.1 kg)   SpO2 100%   BMI 24.63 kg/m?  ? ? ?  10/09/2021  ?  3:41 PM 08/24/2021  ?  3:25 PM 08/21/2021  ? 10:32 AM  ?BP/Weight  ?Systolic BP 128 786 767  ?Diastolic BP 81 89 97  ?Wt. (Lbs) 176.6 169.5   ?BMI 24.63 kg/m2 23.64 kg/m2   ? ? ? ? ?Physical Exam ?Constitutional:   ?   Appearance: She is well-developed.  ?HENT:  ?   Ears:  ?   Comments: Cerumen in R ear, L ear is normal ?Cardiovascular:  ?   Rate and Rhythm: Normal rate.  ?   Heart sounds: Normal heart sounds. No murmur heard. ?Pulmonary:  ?   Effort: Pulmonary effort is normal.  ?   Breath sounds: Normal breath sounds. No wheezing or rales.  ?Chest:  ?   Chest wall: No tenderness.  ?Abdominal:  ?   General: Bowel sounds are normal. There is no distension.  ?   Palpations: Abdomen is soft. There is no mass.  ?   Tenderness: There is no abdominal tenderness.  ?Musculoskeletal:     ?   General: Normal range of motion.  ?   Right lower leg: No edema.  ?   Left lower leg: No edema.  ?Neurological:  ?   Mental Status: She is alert and oriented to person, place, and time.  ?Psychiatric:     ?   Mood and Affect: Mood normal.  ? ? ? ?  Latest Ref Rng & Units 08/08/2021  ?  2:36 PM 06/30/2021  ? 10:28 AM 06/17/2021  ?  2:16 PM  ?CMP  ?Glucose 70 - 99 mg/dL 123   89   129    ?BUN 6 - 20 mg/dL '6   7   20    '$ ?Creatinine 0.44 - 1.00 mg/dL 0.57   0.67   1.39    ?Sodium 135 - 145 mmol/L 143   140   130    ?Potassium 3.5 - 5.1 mmol/L 3.6    4.2   3.1    ?Chloride 98 - 111 mmol/L 108   108  95    ?CO2 22 - 32 mmol/L '29   24   17    '$ ?Calcium 8.9 - 10.3 mg/dL 8.7   8.5   9.5    ?Total Protein 6.5 - 8.1 g/dL 6.0   6.4   8.3    ?Total Bilirubin 0.3 - 1.2 mg/dL 0.2   <0.2   0.8    ?Alkaline Phos 38 - 126 U/L 46   58   69    ?AST 15 - 41 U/L 16   35   30    ?ALT 0 - 44 U/L '17   25   13    '$ ? ? ?Lipid Panel  ?No results found for: CHOL, TRIG, HDL, CHOLHDL, VLDL, LDLCALC, LDLDIRECT ? ?CBC ?   ?Component Value Date/Time  ? WBC 11.8 (H) 08/24/2021 1605  ? WBC 11.7 (H) 08/08/2021 1436  ? WBC 16.4 (H) 06/17/2021 1416  ? RBC 3.47 (L) 08/24/2021 1605  ? RBC 2.47 (L) 08/08/2021 1436  ? HGB 12.3 08/24/2021 1605  ? HCT 36.2 08/24/2021 1605  ? PLT 555 (H) 08/24/2021 1605  ? MCV 104 (H) 08/24/2021 1605  ? MCH 35.4 (H) 08/24/2021 1605  ? MCH 36.0 (H) 08/08/2021 1436  ? MCHC 34.0 08/24/2021 1605  ? MCHC 32.6 08/08/2021 1436  ? RDW 12.5 08/24/2021 1605  ? LYMPHSABS 1.9 08/24/2021 1605  ? MONOABS 0.7 08/08/2021 1436  ? EOSABS 0.1 08/24/2021 1605  ? BASOSABS 0.1 08/24/2021 1605  ? ? ?Lab Results  ?Component Value Date  ? HGBA1C 4.7 (L) 08/24/2021  ? ? ?Assessment & Plan:  ?1. Other chronic sinusitis ?- fluticasone (FLONASE) 50 MCG/ACT nasal spray; Place 2 sprays into both nostrils daily.  Dispense: 16 g; Refill: 1 ?- cetirizine (ZYRTEC) 10 MG tablet; Take 1 tablet (10 mg total) by mouth daily.  Dispense: 30 tablet; Refill: 1 ? ? ?No orders of the defined types were placed in this encounter. ? ? ?Return in about 3 months (around 01/09/2022) for Medical conditions with PCP. ? ? ? ? ? ? ?Charlott Rakes, MD, FAAFP. ?[facility] ?[place], [place] ?[phone]   ?10/09/2021, 4:13 PM ?

## 2021-10-18 ENCOUNTER — Inpatient Hospital Stay: Payer: Self-pay | Admitting: Physician Assistant

## 2021-10-18 ENCOUNTER — Inpatient Hospital Stay: Payer: Self-pay | Attending: Hematology and Oncology

## 2021-10-18 DIAGNOSIS — D75839 Thrombocytosis, unspecified: Secondary | ICD-10-CM | POA: Insufficient documentation

## 2021-10-18 DIAGNOSIS — E876 Hypokalemia: Secondary | ICD-10-CM | POA: Insufficient documentation

## 2021-10-18 DIAGNOSIS — Z79899 Other long term (current) drug therapy: Secondary | ICD-10-CM | POA: Insufficient documentation

## 2021-10-18 DIAGNOSIS — E538 Deficiency of other specified B group vitamins: Secondary | ICD-10-CM | POA: Insufficient documentation

## 2021-10-18 DIAGNOSIS — D500 Iron deficiency anemia secondary to blood loss (chronic): Secondary | ICD-10-CM | POA: Insufficient documentation

## 2021-10-18 DIAGNOSIS — F17210 Nicotine dependence, cigarettes, uncomplicated: Secondary | ICD-10-CM | POA: Insufficient documentation

## 2021-10-18 DIAGNOSIS — N920 Excessive and frequent menstruation with regular cycle: Secondary | ICD-10-CM | POA: Insufficient documentation

## 2021-10-23 ENCOUNTER — Telehealth: Payer: Self-pay | Admitting: Physician Assistant

## 2021-10-23 NOTE — Telephone Encounter (Signed)
.  Called patient to schedule appointment per 4/7 inbasket, patient is aware of date and time.   ?

## 2021-10-27 ENCOUNTER — Inpatient Hospital Stay: Payer: Self-pay

## 2021-10-27 ENCOUNTER — Inpatient Hospital Stay (HOSPITAL_BASED_OUTPATIENT_CLINIC_OR_DEPARTMENT_OTHER): Payer: Self-pay | Admitting: Physician Assistant

## 2021-10-27 ENCOUNTER — Other Ambulatory Visit: Payer: Self-pay

## 2021-10-27 ENCOUNTER — Encounter: Payer: Self-pay | Admitting: Physician Assistant

## 2021-10-27 VITALS — BP 113/75 | HR 89 | Temp 98.0°F | Resp 17 | Ht 71.0 in | Wt 186.0 lb

## 2021-10-27 DIAGNOSIS — E538 Deficiency of other specified B group vitamins: Secondary | ICD-10-CM

## 2021-10-27 DIAGNOSIS — D649 Anemia, unspecified: Secondary | ICD-10-CM

## 2021-10-27 DIAGNOSIS — D500 Iron deficiency anemia secondary to blood loss (chronic): Secondary | ICD-10-CM

## 2021-10-27 DIAGNOSIS — E876 Hypokalemia: Secondary | ICD-10-CM

## 2021-10-27 LAB — CBC WITH DIFFERENTIAL (CANCER CENTER ONLY)
Abs Immature Granulocytes: 0.04 10*3/uL (ref 0.00–0.07)
Basophils Absolute: 0 10*3/uL (ref 0.0–0.1)
Basophils Relative: 0 %
Eosinophils Absolute: 0.2 10*3/uL (ref 0.0–0.5)
Eosinophils Relative: 2 %
HCT: 31.6 % — ABNORMAL LOW (ref 36.0–46.0)
Hemoglobin: 10.5 g/dL — ABNORMAL LOW (ref 12.0–15.0)
Immature Granulocytes: 0 %
Lymphocytes Relative: 17 %
Lymphs Abs: 1.9 10*3/uL (ref 0.7–4.0)
MCH: 32.5 pg (ref 26.0–34.0)
MCHC: 33.2 g/dL (ref 30.0–36.0)
MCV: 97.8 fL (ref 80.0–100.0)
Monocytes Absolute: 0.6 10*3/uL (ref 0.1–1.0)
Monocytes Relative: 5 %
Neutro Abs: 8.8 10*3/uL — ABNORMAL HIGH (ref 1.7–7.7)
Neutrophils Relative %: 76 %
Platelet Count: 243 10*3/uL (ref 150–400)
RBC: 3.23 MIL/uL — ABNORMAL LOW (ref 3.87–5.11)
RDW: 14.3 % (ref 11.5–15.5)
WBC Count: 11.6 10*3/uL — ABNORMAL HIGH (ref 4.0–10.5)
nRBC: 0 % (ref 0.0–0.2)

## 2021-10-27 LAB — CMP (CANCER CENTER ONLY)
ALT: 8 U/L (ref 0–44)
AST: 14 U/L — ABNORMAL LOW (ref 15–41)
Albumin: 3.8 g/dL (ref 3.5–5.0)
Alkaline Phosphatase: 47 U/L (ref 38–126)
Anion gap: 5 (ref 5–15)
BUN: 16 mg/dL (ref 6–20)
CO2: 21 mmol/L — ABNORMAL LOW (ref 22–32)
Calcium: 8.6 mg/dL — ABNORMAL LOW (ref 8.9–10.3)
Chloride: 113 mmol/L — ABNORMAL HIGH (ref 98–111)
Creatinine: 0.99 mg/dL (ref 0.44–1.00)
GFR, Estimated: >60 mL/min (ref >60)
Glucose, Bld: 85 mg/dL (ref 70–99)
Potassium: 3.2 mmol/L — ABNORMAL LOW (ref 3.5–5.1)
Sodium: 139 mmol/L (ref 135–145)
Total Bilirubin: 0.2 mg/dL — ABNORMAL LOW (ref 0.3–1.2)
Total Protein: 6.5 g/dL (ref 6.5–8.1)

## 2021-10-27 LAB — RETIC PANEL
Immature Retic Fract: 25.7 % — ABNORMAL HIGH (ref 2.3–15.9)
RBC.: 3.25 MIL/uL — ABNORMAL LOW (ref 3.87–5.11)
Retic Count, Absolute: 93 10*3/uL (ref 19.0–186.0)
Retic Ct Pct: 2.9 % (ref 0.4–3.1)
Reticulocyte Hemoglobin: 33.7 pg (ref >27.9)

## 2021-10-27 LAB — IRON AND IRON BINDING CAPACITY (CC-WL,HP ONLY)
Iron: 283 ug/dL — ABNORMAL HIGH (ref 28–170)
Saturation Ratios: 85 % — ABNORMAL HIGH (ref 10.4–31.8)
TIBC: 333 ug/dL (ref 250–450)
UIBC: 50 ug/dL — ABNORMAL LOW (ref 148–442)

## 2021-10-27 LAB — VITAMIN B12: Vitamin B-12: 138 pg/mL — ABNORMAL LOW (ref 180–914)

## 2021-10-27 LAB — FERRITIN: Ferritin: 31 ng/mL (ref 11–307)

## 2021-10-27 LAB — FOLATE: Folate: 4.7 ng/mL — ABNORMAL LOW (ref >5.9)

## 2021-10-27 MED ORDER — POTASSIUM CHLORIDE CRYS ER 20 MEQ PO TBCR: 20.0000 meq | EXTENDED_RELEASE_TABLET | Freq: Every day | ORAL | 0 refills | Status: DC

## 2021-10-27 MED ORDER — FOLIC ACID 1 MG PO TABS: 1.0000 mg | ORAL_TABLET | Freq: Every day | ORAL | 3 refills | Status: DC

## 2021-10-27 NOTE — Progress Notes (Signed)
?[facility] ?[phone]   [phone] ? ?PROGRESS NOTE ? ?Patient Care Team: ?Ladell Pier, MD as PCP - General (Internal Medicine) ? ?Hematological/Oncological History ?# Thrombocytosis ?# Leukocytosis ?#Iron Deficiency Anemia 2/2 to GYN Bleeding ?1) 11/26/2010: WBC 16.3, Hgb 15.0, MCV 88.5, Plt 207. First CBC on record ?2) 10/13/2015: WBC 8.7, Hgb 12.2, MCV 88.8, Plt 442 ?3) 07/11/2019: WBC 15.5, Hgb 13.7, MCV 91.7, Plt 842 ?4) 08/18/2019: WBC 12.5, Hgb 13.5, MCV 90.5, Plt 842 ?5) 09/17/2019: WBC 10.3, Hgb 11.4, MCV 89, Plt 656 ?6) 12/03/2019: establish care with Dr. Lorenso Courier  ?7) 03/10/2020: WBC 10.1, Hgb 11.4, MCV 88.3, Plt 505 ?8) 06/13/2020: WBC 9.8, Hgb 13.5, MCV 95.2, Plt 427 ?9) 09/28/2020: WBC 11.0, Hgb 13.2, MCV 95.9, Plt 374 ?10) 03/31/2021: WBC 9.0, Hgb 11.7, MCV 107.7, Plt 530 ?11) 06/30/2021: WBC 11.8, Hgb 9.9, MCV 105.3, Plt 616 ?12) 10/27/2021: WBC 11.6, Hgb 10.5, MCV 97.8, Plt 243, Iron 283, saturation 85%, ferritin, 31, vitamin B12 138, folate 4.7 ?  ?Interval History:  ?Ellen Stone 36 y.o. female with medical history significant for iron deficiency anemia presents for a follow up visit. The patient's last visit was on 06/30/2021. In the interim since the last visit she is had no major changes in her health. ? ?On exam today Mrs.Yerby reports her energy and appetite are fairly stable.  She is able to do all her daily routines on her own without any limitations.  She denies any nausea, vomiting or abdominal pain.  Her bowel habits are regular without any diarrhea or constipation.  She had Nexplanon insertion in December 2020.  She continues to have a menstrual cycle lasting 5 days with 3 to 4 days of heavy bleeding.  Now she does have some spotting in between her cycles.  She is due for follow-up with OB/GYN next month.  Patient denies any fevers, chills, night sweats, shortness of breath, chest pain or cough.  She has no other complaints.  Rest of the 10 point ROS  is below. ? ?MEDICAL HISTORY:  ?Past Medical History:  ?Diagnosis Date  ? Chronic bronchitis ([place])   ? Cocaine abuse ([place])   ? History of trichomoniasis 01/27/2020  ? Hyperphosphatemia 10/20/2015  ? Hypertension   ? Low TSH level 09/29/2019  ? Recurrent UTI (urinary tract infection)   ? "none lately; since I started drinking more water" (10/18/2015)  ? Transaminitis 01/26/2021  ? Vitamin D deficiency 10/20/2015  ? ? ?SURGICAL HISTORY: ?Past Surgical History:  ?Procedure Laterality Date  ? FRACTURE SURGERY    ? ORIF PROXIMAL TIBIAL PLATEAU FRACTURE Left 10/18/2015  ? ORIF TIBIA PLATEAU Left 10/18/2015  ? Procedure: OPEN REDUCTION INTERNAL FIXATION (ORIF) LEFT TIBIAL PLATEAU;  Surgeon: Altamese [place], MD;  Location: [facility];  Service: Orthopedics;  Laterality: Left;  ? ? ?SOCIAL HISTORY: ?Social History  ? ?Socioeconomic History  ? Marital status: Married  ?  Spouse name: Not on file  ? Number of children: Not on file  ? Years of education: Not on file  ? Highest education level: Not on file  ?Occupational History  ? Not on file  ?Tobacco Use  ? Smoking status: Every Day  ?  Packs/day: 1.00  ?  Years: 10.00  ?  Pack years: 10.00  ?  Types: Cigarettes  ? Smokeless tobacco: Never  ?Vaping Use  ? Vaping Use: Never used  ?Substance and Sexual Activity  ? Alcohol use: Yes  ?  Comment: 10/18/2015 "down to maybe 1  beer q 2 weeks or so"  ? Drug use: Yes  ?  Types: Cocaine, Marijuana  ?  Comment: 10/18/2015 "quit cocaine recently"  ? Sexual activity: Not Currently  ?  Birth control/protection: None  ?Other Topics Concern  ? Not on file  ?Social History Narrative  ? Not on file  ? ?Social Determinants of Health  ? ?Financial Resource Strain: Not on file  ?Food Insecurity: Not on file  ?Transportation Needs: Not on file  ?Physical Activity: Not on file  ?Stress: Not on file  ?Social Connections: Not on file  ?Intimate Partner Violence: Not on file  ? ? ?FAMILY HISTORY: ?Family History  ?Problem Relation Age of Onset  ? Clotting disorder Mother   ?  Heart Problems Father   ? Clotting disorder Maternal Uncle   ? ? ?ALLERGIES:  has No Known Allergies. ? ?MEDICATIONS:  ?Current Outpatient Medications  ?Medication Sig Dispense Refill  ? cetirizine (ZYRTEC) 10 MG tablet Take 1 tablet (10 mg total) by mouth daily. 30 tablet 1  ? etonogestrel (NEXPLANON) 68 MG IMPL implant 68 mg by Subdermal route once. Implanted June 23, 2021    ? ferrous sulfate 325 (65 FE) MG EC tablet Take 1 tablet (325 mg total) by mouth daily with breakfast. 90 tablet 3  ? fluticasone (FLONASE) 50 MCG/ACT nasal spray Place 2 sprays into both nostrils daily. 16 g 1  ? ibuprofen (ADVIL) 200 MG tablet Take 200 mg by mouth every 6 (six) hours as needed.    ? methimazole (TAPAZOLE) 5 MG tablet Take 1 tablet (5 mg total) by mouth 2 (two) times daily. 60 tablet 3  ? Vitamin D, Ergocalciferol, (DRISDOL) 1.25 MG (50000 UNIT) CAPS capsule Take 1 capsule (50,000 Units total) by mouth every 7 (seven) days. 16 capsule 0  ? acetaminophen (TYLENOL) 500 MG tablet Take 2,000 mg by mouth 2 (two) times daily as needed (pain). (Patient not taking: Reported on 10/27/2021)    ? ?No current facility-administered medications for this visit.  ? ? ?REVIEW OF SYSTEMS:   ?Constitutional: ( - ) fevers, ( - )  chills , ( - ) night sweats ?Eyes: ( - ) blurriness of vision, ( - ) double vision, ( - ) watery eyes ?Ears, nose, mouth, throat, and face: ( - ) mucositis, ( - ) sore throat ?Respiratory: ( - ) cough, ( - ) dyspnea, ( - ) wheezes ?Cardiovascular: ( - ) palpitation, ( - ) chest discomfort, ( - ) lower extremity swelling ?Gastrointestinal:  ( - ) nausea, ( - ) heartburn, ( - ) change in bowel habits ?Skin: ( - ) abnormal skin rashes ?Lymphatics: ( - ) new lymphadenopathy, ( - ) easy bruising ?Neurological: ( - ) numbness, ( - ) tingling, ( - ) new weaknesses ?Behavioral/Psych: ( - ) mood change, ( - ) new changes  ?All other systems were reviewed with the patient and are negative. ? ?PHYSICAL EXAMINATION: ?ECOG  PERFORMANCE STATUS: 0 - Asymptomatic ? ?Vitals:  ? 10/27/21 1334  ?BP: 113/75  ?Pulse: 89  ?Resp: 17  ?Temp: 98 ?F (36.7 ?C)  ?SpO2: 100%  ? ? ?Filed Weights  ? 10/27/21 1334  ?Weight: 186 lb (84.4 kg)  ? ? ? ?GENERAL: well appearing young [place] American female. alert, no distress and comfortable ?SKIN: skin color, texture, turgor are normal, no rashes or significant lesions ?EYES: conjunctiva are pink and non-injected, sclera clear ?LUNGS: clear to auscultation and percussion with normal breathing effort ?HEART: regular rate &  rhythm and no murmurs and no lower extremity edema ?Musculoskeletal: no cyanosis of digits and no clubbing  ?PSYCH: alert & oriented x 3, fluent speech ?NEURO: no focal motor/sensory deficits ? ?LABORATORY DATA:  ?I have reviewed the data as listed ? ?  Latest Ref Rng & Units 10/27/2021  ?  1:24 PM 08/24/2021  ?  4:05 PM 08/08/2021  ?  2:36 PM  ?CBC  ?WBC 4.0 - 10.5 K/uL 11.6   11.8   11.7    ?Hemoglobin 12.0 - 15.0 g/dL 10.5   12.3   8.9    ?Hematocrit 36.0 - 46.0 % 31.6   36.2   27.3    ?Platelets 150 - 400 K/uL 243   555   496    ? ? ? ?  Latest Ref Rng & Units 10/27/2021  ?  1:24 PM 08/08/2021  ?  2:36 PM 06/30/2021  ? 10:28 AM  ?CMP  ?Glucose 70 - 99 mg/dL 85   123   89    ?BUN 6 - 20 mg/dL '16   6   7    '$ ?Creatinine 0.44 - 1.00 mg/dL 0.99   0.57   0.67    ?Sodium 135 - 145 mmol/L 139   143   140    ?Potassium 3.5 - 5.1 mmol/L 3.2   3.6   4.2    ?Chloride 98 - 111 mmol/L 113   108   108    ?CO2 22 - 32 mmol/L '21   29   24    '$ ?Calcium 8.9 - 10.3 mg/dL 8.6   8.7   8.5    ?Total Protein 6.5 - 8.1 g/dL 6.5   6.0   6.4    ?Total Bilirubin 0.3 - 1.2 mg/dL 0.2   0.2   <0.2    ?Alkaline Phos 38 - 126 U/L 47   46   58    ?AST 15 - 41 U/L 14   16   35    ?ALT 0 - 44 U/L '8   17   25    '$ ? ? ?No results found for: MPROTEIN ?No results found for: KPAFRELGTCHN, LAMBDASER, KAPLAMBRATIO ? ? ?RADIOGRAPHIC STUDIES: ?No results found. ? ?ASSESSMENT & PLAN ?Audryna Wendt Tryon 36 y.o. female with medical history  significant for iron deficiency anemia presents for a follow up visit.  ? ?#Iron Deficiency Anemia 2/2 to GYN Blood loss ?--Labs today show anemia with Hgb 10.5. Iron panel shows no deficiency ?--continue PO fer

## 2021-10-30 ENCOUNTER — Telehealth: Payer: Self-pay | Admitting: Hematology and Oncology

## 2021-10-30 NOTE — Telephone Encounter (Signed)
Scheduled per 4/14 los, tp has been called and confirmed appts ?

## 2021-11-03 ENCOUNTER — Other Ambulatory Visit: Payer: Self-pay

## 2021-11-03 ENCOUNTER — Inpatient Hospital Stay: Payer: Self-pay

## 2021-11-03 ENCOUNTER — Encounter: Payer: Self-pay | Admitting: Physician Assistant

## 2021-11-03 DIAGNOSIS — E538 Deficiency of other specified B group vitamins: Secondary | ICD-10-CM

## 2021-11-03 MED ORDER — CYANOCOBALAMIN 1000 MCG/ML IJ SOLN
1000.0000 ug | Freq: Once | INTRAMUSCULAR | Status: AC
  Administered 2021-11-03: 1000 ug via INTRAMUSCULAR
  Filled 2021-11-03: qty 1

## 2021-11-10 ENCOUNTER — Other Ambulatory Visit: Payer: Self-pay

## 2021-11-10 ENCOUNTER — Inpatient Hospital Stay: Payer: Self-pay

## 2021-11-10 DIAGNOSIS — E538 Deficiency of other specified B group vitamins: Secondary | ICD-10-CM

## 2021-11-10 MED ORDER — CYANOCOBALAMIN 1000 MCG/ML IJ SOLN
1000.0000 ug | Freq: Once | INTRAMUSCULAR | Status: AC
  Administered 2021-11-10: 1000 ug via INTRAMUSCULAR
  Filled 2021-11-10: qty 1

## 2021-11-10 NOTE — Patient Instructions (Signed)
Vitamin B12 Deficiency Vitamin B12 deficiency occurs when the body does not have enough of this important vitamin. The body needs this vitamin: To make red blood cells. To make DNA. This is the genetic material inside cells. To help the nerves work properly so they can carry messages from the brain to the body. Vitamin B12 deficiency can cause health problems, such as not having enough red blood cells in the blood (anemia). This can lead to nerve damage if untreated. What are the causes? This condition may be caused by: Not eating enough foods that contain vitamin B12. Not having enough stomach acid and digestive fluids to properly absorb vitamin B12 from the food that you eat. Having certain diseases that make it hard to absorb vitamin B12. These diseases include Crohn's disease, chronic pancreatitis, and cystic fibrosis. An autoimmune disorder in which the body does not make enough of a protein (intrinsic factor) within the stomach, resulting in not enough absorption of vitamin B12. Having a surgery in which part of the stomach or small intestine is removed. Taking certain medicines that make it hard for the body to absorb vitamin B12. These include: Heartburn medicines, such as antacids and proton pump inhibitors. Some medicines that are used to treat diabetes. What increases the risk? The following factors may make you more likely to develop a vitamin B12 deficiency: Being an older adult. Eating a vegetarian or vegan diet that does not include any foods that come from animals. Eating a poor diet while you are pregnant. Taking certain medicines. Having alcoholism. What are the signs or symptoms? In some cases, there are no symptoms of this condition. If the condition leads to anemia or nerve damage, various symptoms may occur, such as: Weakness. Tiredness (fatigue). Loss of appetite. Numbness or tingling in your hands and feet. Redness and burning of the tongue. Depression,  confusion, or memory problems. Trouble walking. If anemia is severe, symptoms can include: Shortness of breath. Dizziness. Rapid heart rate. How is this diagnosed? This condition may be diagnosed with a blood test to measure the level of vitamin B12 in your blood. You may also have other tests, including: A group of tests that measure certain characteristics of blood cells (complete blood count, CBC). A blood test to measure intrinsic factor. A procedure where a thin tube with a camera on the end is used to look into your stomach or intestines (endoscopy). Other tests may be needed to discover the cause of the deficiency. How is this treated? Treatment for this condition depends on the cause. This condition may be treated by: Changing your eating and drinking habits, such as: Eating more foods that contain vitamin B12. Drinking less alcohol or no alcohol. Getting vitamin B12 injections. Taking vitamin B12 supplements by mouth (orally). Your health care provider will tell you which dose is best for you. Follow these instructions at home: Eating and drinking  Include foods in your diet that come from animals and contain a lot of vitamin B12. These include: Meats and poultry. This includes beef, pork, chicken, turkey, and organ meats, such as liver. Seafood. This includes clams, rainbow trout, salmon, tuna, and haddock. Eggs. Dairy foods such as milk, yogurt, and cheese. Eat foods that have vitamin B12 added to them (are fortified), such as ready-to-eat breakfast cereals. Check the label on the package to see if a food is fortified. The items listed above may not be a complete list of foods and beverages you can eat and drink. Contact a dietitian for   more information. Alcohol use Do not drink alcohol if: Your health care provider tells you not to drink. You are pregnant, may be pregnant, or are planning to become pregnant. If you drink alcohol: Limit how much you have to: 0-1 drink a  day for women. 0-2 drinks a day for men. Know how much alcohol is in your drink. In the [place] one drink equals one 12 oz bottle of beer (355 mL), one 5 oz glass of wine (148 mL), or one 1 oz glass of hard liquor (44 mL). General instructions Get vitamin B12 injections if told to by your health care provider. Take supplements only as told by your health care provider. Follow the directions carefully. Keep all follow-up visits. This is important. Contact a health care provider if: Your symptoms come back. Your symptoms get worse or do not improve with treatment. Get help right away: You develop shortness of breath. You have a rapid heart rate. You have chest pain. You become dizzy or you faint. These symptoms may be an emergency. Get help right away. Call 911. Do not wait to see if the symptoms will go away. Do not drive yourself to the hospital. Summary Vitamin B12 deficiency occurs when the body does not have enough of this important vitamin. Common causes include not eating enough foods that contain vitamin B12, not being able to absorb vitamin B12 from the food that you eat, having a surgery in which part of the stomach or small intestine is removed, or taking certain medicines. Eat foods that have vitamin B12 in them. Treatment may include making a change in the way you eat and drink, getting vitamin B12 injections, or taking vitamin B12 supplements. This information is not intended to replace advice given to you by your health care provider. Make sure you discuss any questions you have with your health care provider. Document Revised: 02/24/2021 Document Reviewed: 02/24/2021 Elsevier Patient Education  2023 [employer]  

## 2021-11-17 ENCOUNTER — Inpatient Hospital Stay: Payer: Medicaid Other | Attending: Hematology and Oncology

## 2021-11-17 ENCOUNTER — Other Ambulatory Visit: Payer: Self-pay

## 2021-11-17 DIAGNOSIS — D500 Iron deficiency anemia secondary to blood loss (chronic): Secondary | ICD-10-CM | POA: Insufficient documentation

## 2021-11-17 DIAGNOSIS — E538 Deficiency of other specified B group vitamins: Secondary | ICD-10-CM | POA: Insufficient documentation

## 2021-11-17 DIAGNOSIS — N920 Excessive and frequent menstruation with regular cycle: Secondary | ICD-10-CM | POA: Insufficient documentation

## 2021-11-17 MED ORDER — CYANOCOBALAMIN 1000 MCG/ML IJ SOLN
1000.0000 ug | Freq: Once | INTRAMUSCULAR | Status: AC
  Administered 2021-11-17: 1000 ug via INTRAMUSCULAR
  Filled 2021-11-17: qty 1

## 2021-11-24 ENCOUNTER — Inpatient Hospital Stay: Payer: Self-pay

## 2021-11-24 ENCOUNTER — Encounter: Payer: Self-pay | Admitting: Physician Assistant

## 2021-11-24 ENCOUNTER — Other Ambulatory Visit: Payer: Self-pay

## 2021-11-24 DIAGNOSIS — E538 Deficiency of other specified B group vitamins: Secondary | ICD-10-CM

## 2021-11-24 MED ORDER — CYANOCOBALAMIN 1000 MCG/ML IJ SOLN
1000.0000 ug | Freq: Once | INTRAMUSCULAR | Status: AC
  Administered 2021-11-24: 1000 ug via INTRAMUSCULAR
  Filled 2021-11-24: qty 1

## 2021-11-30 ENCOUNTER — Ambulatory Visit: Payer: Medicaid Other | Admitting: Internal Medicine

## 2021-12-05 ENCOUNTER — Encounter: Payer: Self-pay | Admitting: Physician Assistant

## 2021-12-05 ENCOUNTER — Ambulatory Visit: Payer: Medicaid Other | Attending: Internal Medicine | Admitting: Internal Medicine

## 2021-12-05 ENCOUNTER — Other Ambulatory Visit: Payer: Self-pay

## 2021-12-05 VITALS — BP 111/77 | HR 85 | Ht 71.0 in | Wt 185.2 lb

## 2021-12-05 DIAGNOSIS — E538 Deficiency of other specified B group vitamins: Secondary | ICD-10-CM

## 2021-12-05 DIAGNOSIS — E876 Hypokalemia: Secondary | ICD-10-CM

## 2021-12-05 DIAGNOSIS — R7989 Other specified abnormal findings of blood chemistry: Secondary | ICD-10-CM

## 2021-12-05 DIAGNOSIS — D500 Iron deficiency anemia secondary to blood loss (chronic): Secondary | ICD-10-CM

## 2021-12-05 DIAGNOSIS — F17210 Nicotine dependence, cigarettes, uncomplicated: Secondary | ICD-10-CM

## 2021-12-05 DIAGNOSIS — F17200 Nicotine dependence, unspecified, uncomplicated: Secondary | ICD-10-CM

## 2021-12-05 DIAGNOSIS — J301 Allergic rhinitis due to pollen: Secondary | ICD-10-CM

## 2021-12-05 MED ORDER — FLUTICASONE PROPIONATE 50 MCG/ACT NA SUSP
2.0000 | Freq: Every day | NASAL | 1 refills | Status: DC
Start: 2021-12-05 — End: 2022-02-21
  Filled 2021-12-05: qty 16, 30d supply, fill #0

## 2021-12-05 MED ORDER — CETIRIZINE HCL 10 MG PO TABS
10.0000 mg | ORAL_TABLET | Freq: Every day | ORAL | 3 refills | Status: DC
  Filled 2021-12-05: qty 30, 30d supply, fill #0

## 2021-12-05 NOTE — Progress Notes (Signed)
Patient ID: Ellen Stone, female    DOB: March 14, 1986  MRN: [id number]  CC: Allergies   Subjective: Ellen Stone is a 36 y.o. female who presents for chronic ds management Her concerns today include:  Patient with history of tobacco dependence, adenomyosis with menorrhagia with regular cycle pelvic pain status post Nexplanon placement by GYN, B12 and folate def and iron deficiency, followed by hematology  Request RF on Zyrtec and Flonase. Having rhinorrhea and other allergy symptoms.Clarnce Flock hematology PA 10/27/2021: For iron deficiency anemia.  She is on iron supplement orally.  Also found to have B12 and folate deficiency.  She was started on B12 injections once a week for 4 weeks which she has completed.  She will now receive B12 injections once a month.  Taking folic acid daily.  Has appt with GYN Dr. Ilda Basset next mth. Had Nexplanon; placed 06/23/2022.    Told to expect irregular bleeding for the first 1-2 mths.  Since then she has been having to take Ibuprofen daily for lower abdominal cramps that feel like menstrual cramps. Menses have been lighter with not as much clotting.  Reports spotting intermittently b/w menstrual cycles.     Since I last saw her, she was started on Tapazole 5 mg BID by our PA whom she saw 08/2021.  Repeat TSH was found to be mildly decrease at 0.3 .  Taking Tapzole as prescribed. No palpitations, no feeling of being hot or cold, no wgh changes.  Found to have low K+ level on labs done 10/2021 by hematology..  Given 1 wk supply of K+ supplement.  Which she completed.   Tob dep: down to 2 pks/wk from 4pk/wk.  Not ready to quit yet.  She is aware of the health risks associated with smoking. Patient Active Problem List   Diagnosis Date Noted   B12 deficiency 10/27/2021   Abnormal uterine bleeding (AUB) 01/26/2021   Hypertension    Adenomyosis 09/29/2019   Thrombocytosis 09/17/2019   Vitamin D deficiency 10/20/2015   Hyperphosphatemia 10/20/2015     Current  Outpatient Medications on File Prior to Visit  Medication Sig Dispense Refill   cetirizine (ZYRTEC) 10 MG tablet Take 1 tablet (10 mg total) by mouth daily. 30 tablet 1   etonogestrel (NEXPLANON) 68 MG IMPL implant 68 mg by Subdermal route once. Implanted June 23, 2021     ferrous sulfate 325 (65 FE) MG EC tablet Take 1 tablet (325 mg total) by mouth daily with breakfast. 90 tablet 3   fluticasone (FLONASE) 50 MCG/ACT nasal spray Place 2 sprays into both nostrils daily. 16 g 1   folic acid (FOLVITE) 1 MG tablet Take 1 tablet (1 mg total) by mouth daily. 30 tablet 3   ibuprofen (ADVIL) 200 MG tablet Take 200 mg by mouth every 6 (six) hours as needed.     methimazole (TAPAZOLE) 5 MG tablet Take 1 tablet (5 mg total) by mouth 2 (two) times daily. 60 tablet 3   potassium chloride SA (KLOR-CON M) 20 MEQ tablet Take 1 tablet (20 mEq total) by mouth daily. 7 tablet 0   Vitamin D, Ergocalciferol, (DRISDOL) 1.25 MG (50000 UNIT) CAPS capsule Take 1 capsule (50,000 Units total) by mouth every 7 (seven) days. 16 capsule 0   No current facility-administered medications on file prior to visit.    No Known Allergies  Social History   Socioeconomic History   Marital status: Married    Spouse name: Not on file   Number  of children: Not on file   Years of education: Not on file   Highest education level: Not on file  Occupational History   Not on file  Tobacco Use   Smoking status: Every Day    Packs/day: 1.00    Years: 10.00    Pack years: 10.00    Types: Cigarettes   Smokeless tobacco: Never  Vaping Use   Vaping Use: Never used  Substance and Sexual Activity   Alcohol use: Yes    Comment: 10/18/2015 "down to maybe 1 beer q 2 weeks or so"   Drug use: Yes    Types: Cocaine, Marijuana    Comment: 10/18/2015 "quit cocaine recently"   Sexual activity: Not Currently    Birth control/protection: None  Other Topics Concern   Not on file  Social History Narrative   Not on file   Social  Determinants of Health   Financial Resource Strain: Not on file  Food Insecurity: Not on file  Transportation Needs: Not on file  Physical Activity: Not on file  Stress: Not on file  Social Connections: Not on file  Intimate Partner Violence: Not on file    Family History  Problem Relation Age of Onset   Clotting disorder Mother    Heart Problems Father    Clotting disorder Maternal Uncle     Past Surgical History:  Procedure Laterality Date   FRACTURE SURGERY     ORIF PROXIMAL TIBIAL PLATEAU FRACTURE Left 10/18/2015   ORIF TIBIA PLATEAU Left 10/18/2015   Procedure: OPEN REDUCTION INTERNAL FIXATION (ORIF) LEFT TIBIAL PLATEAU;  Surgeon: Altamese Piqua, MD;  Location: [place];  Service: Orthopedics;  Laterality: Left;    ROS: Review of Systems Negative except as stated above  PHYSICAL EXAM: BP 111/77   Pulse 85   Ht '5\' 11"'$  (1.803 m)   Wt 185 lb 3.2 oz (84 kg)   SpO2 100%   BMI 25.83 kg/m   Wt Readings from Last 3 Encounters:  12/05/21 185 lb 3.2 oz (84 kg)  10/27/21 186 lb (84.4 kg)  10/09/21 176 lb 9.6 oz (80.1 kg)    Physical Exam  General appearance - alert, well appearing, and in no distress Mental status - normal mood, behavior, speech, dress, motor activity, and thought processes Neck - supple, no significant adenopathy.  No thyroid enlargement.  No thyroid nodules appreciated. Chest - clear to auscultation, no wheezes, rales or rhonchi, symmetric air entry Heart - normal rate, regular rhythm, normal S1, S2, no murmurs, rubs, clicks or gallops Extremities - peripheral pulses normal, no pedal edema, no clubbing or cyanosis      Latest Ref Rng & Units 10/27/2021    1:24 PM 08/08/2021    2:36 PM 06/30/2021   10:28 AM  CMP  Glucose 70 - 99 mg/dL 85   123   89    BUN 6 - 20 mg/dL '16   6   7    '$ Creatinine 0.44 - 1.00 mg/dL 0.99   0.57   0.67    Sodium 135 - 145 mmol/L 139   143   140    Potassium 3.5 - 5.1 mmol/L 3.2   3.6   4.2    Chloride 98 - 111 mmol/L 113    108   108    CO2 22 - 32 mmol/L '21   29   24    '$ Calcium 8.9 - 10.3 mg/dL 8.6   8.7   8.5    Total Protein 6.5 -  8.1 g/dL 6.5   6.0   6.4    Total Bilirubin 0.3 - 1.2 mg/dL 0.2   0.2   <0.2    Alkaline Phos 38 - 126 U/L 47   46   58    AST 15 - 41 U/L 14   16   35    ALT 0 - 44 U/L '8   17   25     '$ Lipid Panel  No results found for: CHOL, TRIG, HDL, CHOLHDL, VLDL, LDLCALC, LDLDIRECT  CBC    Component Value Date/Time   WBC 11.6 (H) 10/27/2021 1324   WBC 16.4 (H) 06/17/2021 1416   RBC 3.25 (L) 10/27/2021 1324   RBC 3.23 (L) 10/27/2021 1324   HGB 10.5 (L) 10/27/2021 1324   HGB 12.3 08/24/2021 1605   HCT 31.6 (L) 10/27/2021 1324   HCT 36.2 08/24/2021 1605   PLT 243 10/27/2021 1324   PLT 555 (H) 08/24/2021 1605   MCV 97.8 10/27/2021 1324   MCV 104 (H) 08/24/2021 1605   MCH 32.5 10/27/2021 1324   MCHC 33.2 10/27/2021 1324   RDW 14.3 10/27/2021 1324   RDW 12.5 08/24/2021 1605   LYMPHSABS 1.9 10/27/2021 1324   LYMPHSABS 1.9 08/24/2021 1605   MONOABS 0.6 10/27/2021 1324   EOSABS 0.2 10/27/2021 1324   EOSABS 0.1 08/24/2021 1605   BASOSABS 0.0 10/27/2021 1324   BASOSABS 0.1 08/24/2021 1605    ASSESSMENT AND PLAN: 1. Seasonal allergic rhinitis due to pollen - cetirizine (ZYRTEC) 10 MG tablet; Take 1 tablet (10 mg total) by mouth daily.  Dispense: 30 tablet; Refill: 3 - fluticasone (FLONASE) 50 MCG/ACT nasal spray; Place 2 sprays into both nostrils daily.  Dispense: 16 g; Refill: 1  2. Abnormal thyroid blood test We will recheck thyroid panel. - TSH+T4F+T3Free  3. Tobacco dependence Advised to quit.  Patient not ready to give a trial of quitting.  4. Hypokalemia We will recheck potassium levels today.  Advised of potassium rich foods that include bananas, oranges and orange juice. - Potassium  5. Iron deficiency anemia due to chronic blood loss Due to heavy menstrual cycles that have decreased with placement of the Nexplanon.  She is still experiencing some irregular  bleeding/spotting between cycles which I think will get better.  She will keep appointment with gynecology next month.  6. Vitamin B 12 deficiency 7. Folate deficiency Receiving B12 injections through hematology.  Continue oral folic acid.    Patient was given the opportunity to ask questions.  Patient verbalized understanding of the plan and was able to repeat key elements of the plan.   This documentation was completed using Radio producer.  Any transcriptional errors are unintentional.  No orders of the defined types were placed in this encounter.    Requested Prescriptions    No prescriptions requested or ordered in this encounter    No follow-ups on file.  Karle Plumber, MD, FACP

## 2021-12-05 NOTE — Progress Notes (Signed)
Requesting refills on all medications to be sent to [facility] pharmacy.

## 2021-12-06 ENCOUNTER — Other Ambulatory Visit: Payer: Self-pay | Admitting: Internal Medicine

## 2021-12-06 LAB — TSH+T4F+T3FREE
Free T4: 0.79 ng/dL — ABNORMAL LOW (ref 0.82–1.77)
T3, Free: 2 pg/mL (ref 2.0–4.4)
TSH: 2.42 u[IU]/mL (ref 0.450–4.500)

## 2021-12-06 LAB — POTASSIUM: Potassium: 4.6 mmol/L (ref 3.5–5.2)

## 2021-12-06 NOTE — Progress Notes (Signed)
Let patient know that her potassium level came back normal. Based on the results of thyroid test, I recommend decreasing the thyroid medication methimazole from 5 mg twice a day to 5 mg once a day.

## 2021-12-07 ENCOUNTER — Other Ambulatory Visit (HOSPITAL_COMMUNITY): Payer: Self-pay

## 2021-12-08 ENCOUNTER — Telehealth: Payer: Self-pay

## 2021-12-08 ENCOUNTER — Other Ambulatory Visit: Payer: Self-pay

## 2021-12-08 NOTE — Telephone Encounter (Signed)
Contacted pt to go over lab results pt is aware and doesn't have any questions or concerns 

## 2021-12-14 ENCOUNTER — Other Ambulatory Visit: Payer: Self-pay | Admitting: Internal Medicine

## 2021-12-14 DIAGNOSIS — E0590 Thyrotoxicosis, unspecified without thyrotoxic crisis or storm: Secondary | ICD-10-CM

## 2021-12-14 MED ORDER — METHIMAZOLE 5 MG PO TABS: 5.0000 mg | ORAL_TABLET | Freq: Every day | ORAL | 3 refills | Status: DC

## 2021-12-18 ENCOUNTER — Ambulatory Visit: Payer: Medicaid Other | Admitting: Obstetrics and Gynecology

## 2021-12-22 ENCOUNTER — Inpatient Hospital Stay: Payer: Medicaid Other | Attending: Hematology and Oncology

## 2021-12-22 ENCOUNTER — Encounter: Payer: Self-pay | Admitting: Physician Assistant

## 2021-12-22 DIAGNOSIS — D500 Iron deficiency anemia secondary to blood loss (chronic): Secondary | ICD-10-CM | POA: Insufficient documentation

## 2021-12-22 DIAGNOSIS — N920 Excessive and frequent menstruation with regular cycle: Secondary | ICD-10-CM | POA: Insufficient documentation

## 2021-12-22 DIAGNOSIS — E538 Deficiency of other specified B group vitamins: Secondary | ICD-10-CM | POA: Insufficient documentation

## 2021-12-22 MED ORDER — CYANOCOBALAMIN 1000 MCG/ML IJ SOLN
1000.0000 ug | Freq: Once | INTRAMUSCULAR | Status: AC
  Administered 2021-12-22: 1000 ug via INTRAMUSCULAR
  Filled 2021-12-22: qty 1

## 2022-01-19 ENCOUNTER — Other Ambulatory Visit: Payer: Self-pay

## 2022-01-19 ENCOUNTER — Encounter: Payer: Self-pay | Admitting: Physician Assistant

## 2022-01-19 ENCOUNTER — Inpatient Hospital Stay: Payer: Medicaid Other | Attending: Hematology and Oncology

## 2022-01-19 DIAGNOSIS — N920 Excessive and frequent menstruation with regular cycle: Secondary | ICD-10-CM | POA: Insufficient documentation

## 2022-01-19 DIAGNOSIS — D500 Iron deficiency anemia secondary to blood loss (chronic): Secondary | ICD-10-CM | POA: Insufficient documentation

## 2022-01-19 DIAGNOSIS — E538 Deficiency of other specified B group vitamins: Secondary | ICD-10-CM | POA: Insufficient documentation

## 2022-01-19 MED ORDER — CYANOCOBALAMIN 1000 MCG/ML IJ SOLN
1000.0000 ug | Freq: Once | INTRAMUSCULAR | Status: AC
  Administered 2022-01-19: 1000 ug via INTRAMUSCULAR
  Filled 2022-01-19: qty 1

## 2022-02-08 ENCOUNTER — Ambulatory Visit: Payer: Medicaid Other | Admitting: Student

## 2022-02-16 ENCOUNTER — Ambulatory Visit: Payer: Medicaid Other | Admitting: Hematology and Oncology

## 2022-02-16 ENCOUNTER — Other Ambulatory Visit: Payer: Medicaid Other

## 2022-02-16 ENCOUNTER — Ambulatory Visit: Payer: Medicaid Other

## 2022-02-21 ENCOUNTER — Other Ambulatory Visit: Payer: Self-pay | Admitting: Physician Assistant

## 2022-02-21 ENCOUNTER — Other Ambulatory Visit: Payer: Self-pay | Admitting: Family Medicine

## 2022-02-21 DIAGNOSIS — J301 Allergic rhinitis due to pollen: Secondary | ICD-10-CM

## 2022-02-21 NOTE — Telephone Encounter (Signed)
Requested Prescriptions  Pending Prescriptions Disp Refills  . cetirizine (ZYRTEC) 10 MG tablet [Pharmacy Med Name: CETIRIZINE '10MG'$  TABLETS] 90 tablet     Sig: TAKE 1 TABLET(10 MG) BY MOUTH DAILY     Ear, Nose, and Throat:  Antihistamines 2 Passed - 02/21/2022  5:33 PM      Passed - Cr in normal range and within 360 days    Creatinine  Date Value Ref Range Status  10/27/2021 0.99 0.44 - 1.00 mg/dL Final  09/17/2019 294.6 20.0 - 300.0 mg/dL Final         Passed - Valid encounter within last 12 months    Recent Outpatient Visits          2 months ago Seasonal allergic rhinitis due to pollen   [name], [name], MD   4 months ago Other chronic sinusitis   [name], [name], MD   6 months ago Vitamin D deficiency   [name], [name], [name]   7 months ago Iron deficiency   [name], MD   12 months ago Mandibular pain   [name], MD             . fluticasone [facility]) 50 MCG/ACT nasal spray [Pharmacy Med Name: FLUTICASONE 50MCG NASAL SP (120) RX] 48 g     Sig: SHAKE LIQUID AND USE 2 SPRAYS IN EACH NOSTRIL DAILY     Ear, Nose, and Throat: Nasal Preparations - Corticosteroids Passed - 02/21/2022  5:33 PM      Passed - Valid encounter within last 12 months    Recent Outpatient Visits          2 months ago Seasonal allergic rhinitis due to pollen   [name], [name], MD   4 months ago Other chronic sinusitis   [name], [name], MD   6 months ago Vitamin D deficiency   [name], [name]   7 months ago Iron deficiency   [name], MD   12 months ago Mandibular pain   [name], MD

## 2022-02-22 ENCOUNTER — Encounter: Payer: Self-pay | Admitting: Physician Assistant

## 2022-02-22 ENCOUNTER — Other Ambulatory Visit: Payer: Self-pay | Admitting: Physician Assistant

## 2022-02-22 DIAGNOSIS — E538 Deficiency of other specified B group vitamins: Secondary | ICD-10-CM

## 2022-02-22 DIAGNOSIS — D500 Iron deficiency anemia secondary to blood loss (chronic): Secondary | ICD-10-CM

## 2022-02-23 ENCOUNTER — Inpatient Hospital Stay: Payer: Self-pay | Attending: Hematology and Oncology

## 2022-02-23 ENCOUNTER — Inpatient Hospital Stay: Payer: Self-pay | Admitting: Physician Assistant

## 2022-02-23 ENCOUNTER — Inpatient Hospital Stay: Payer: Self-pay

## 2022-02-28 ENCOUNTER — Telehealth: Payer: Self-pay | Admitting: Physician Assistant

## 2022-02-28 NOTE — Telephone Encounter (Signed)
Scheduled per 8/11 in basket, attempted to call but no voicemail, will mail calender

## 2022-03-01 ENCOUNTER — Other Ambulatory Visit: Payer: Self-pay

## 2022-03-01 ENCOUNTER — Emergency Department (HOSPITAL_COMMUNITY)
Admission: EM | Admit: 2022-03-01 | Discharge: 2022-03-02 | Discharge status: Against Medical Advice | Payer: Medicaid Other | Attending: Emergency Medicine | Admitting: Emergency Medicine

## 2022-03-01 DIAGNOSIS — Z5321 Procedure and treatment not carried out due to patient leaving prior to being seen by health care provider: Secondary | ICD-10-CM | POA: Insufficient documentation

## 2022-03-01 DIAGNOSIS — K0889 Other specified disorders of teeth and supporting structures: Secondary | ICD-10-CM | POA: Insufficient documentation

## 2022-03-01 NOTE — ED Notes (Signed)
Pt left stated she was tired of waiting.

## 2022-03-01 NOTE — ED Triage Notes (Signed)
Pt c/o toothache x "a couple days," states she thinks it's "a rotten tooth that needs to come out." Hx of same approx 2yrago, states she's been working on getting dental appt, but unable to do so.

## 2022-03-21 ENCOUNTER — Other Ambulatory Visit: Payer: Self-pay

## 2022-03-21 ENCOUNTER — Inpatient Hospital Stay: Payer: Medicaid Other | Attending: Hematology and Oncology

## 2022-03-21 ENCOUNTER — Inpatient Hospital Stay (HOSPITAL_BASED_OUTPATIENT_CLINIC_OR_DEPARTMENT_OTHER): Payer: Medicaid Other | Admitting: Physician Assistant

## 2022-03-21 ENCOUNTER — Inpatient Hospital Stay: Payer: Self-pay

## 2022-03-21 VITALS — BP 117/85 | HR 94 | Temp 97.2°F | Resp 17 | Wt 182.4 lb

## 2022-03-21 DIAGNOSIS — N920 Excessive and frequent menstruation with regular cycle: Secondary | ICD-10-CM | POA: Insufficient documentation

## 2022-03-21 DIAGNOSIS — D500 Iron deficiency anemia secondary to blood loss (chronic): Secondary | ICD-10-CM

## 2022-03-21 DIAGNOSIS — D75839 Thrombocytosis, unspecified: Secondary | ICD-10-CM | POA: Insufficient documentation

## 2022-03-21 DIAGNOSIS — E538 Deficiency of other specified B group vitamins: Secondary | ICD-10-CM | POA: Insufficient documentation

## 2022-03-21 DIAGNOSIS — Z79899 Other long term (current) drug therapy: Secondary | ICD-10-CM | POA: Insufficient documentation

## 2022-03-21 DIAGNOSIS — F17210 Nicotine dependence, cigarettes, uncomplicated: Secondary | ICD-10-CM | POA: Insufficient documentation

## 2022-03-21 LAB — CBC WITH DIFFERENTIAL (CANCER CENTER ONLY)
Abs Immature Granulocytes: 0.05 10*3/uL (ref 0.00–0.07)
Basophils Absolute: 0.1 10*3/uL (ref 0.0–0.1)
Basophils Relative: 1 %
Eosinophils Absolute: 0.1 10*3/uL (ref 0.0–0.5)
Eosinophils Relative: 1 %
HCT: 37.6 % (ref 36.0–46.0)
Hemoglobin: 12.2 g/dL (ref 12.0–15.0)
Immature Granulocytes: 1 %
Lymphocytes Relative: 18 %
Lymphs Abs: 1.7 10*3/uL (ref 0.7–4.0)
MCH: 29.2 pg (ref 26.0–34.0)
MCHC: 32.4 g/dL (ref 30.0–36.0)
MCV: 90 fL (ref 80.0–100.0)
Monocytes Absolute: 0.7 10*3/uL (ref 0.1–1.0)
Monocytes Relative: 7 %
Neutro Abs: 6.6 10*3/uL (ref 1.7–7.7)
Neutrophils Relative %: 72 %
Platelet Count: 406 10*3/uL — ABNORMAL HIGH (ref 150–400)
RBC: 4.18 MIL/uL (ref 3.87–5.11)
RDW: 14.8 % (ref 11.5–15.5)
WBC Count: 9.2 10*3/uL (ref 4.0–10.5)
nRBC: 0 % (ref 0.0–0.2)

## 2022-03-21 LAB — IRON AND IRON BINDING CAPACITY (CC-WL,HP ONLY)
Iron: 85 ug/dL (ref 28–170)
Saturation Ratios: 23 % (ref 10.4–31.8)
TIBC: 371 ug/dL (ref 250–450)
UIBC: 286 ug/dL (ref 148–442)

## 2022-03-21 LAB — CMP (CANCER CENTER ONLY)
ALT: 7 U/L (ref 0–44)
AST: 10 U/L — ABNORMAL LOW (ref 15–41)
Albumin: 4.4 g/dL (ref 3.5–5.0)
Alkaline Phosphatase: 62 U/L (ref 38–126)
Anion gap: 8 (ref 5–15)
BUN: 13 mg/dL (ref 6–20)
CO2: 19 mmol/L — ABNORMAL LOW (ref 22–32)
Calcium: 8.9 mg/dL (ref 8.9–10.3)
Chloride: 110 mmol/L (ref 98–111)
Creatinine: 1.18 mg/dL — ABNORMAL HIGH (ref 0.44–1.00)
GFR, Estimated: >60 mL/min (ref >60)
Glucose, Bld: 82 mg/dL (ref 70–99)
Potassium: 4.8 mmol/L (ref 3.5–5.1)
Sodium: 137 mmol/L (ref 135–145)
Total Bilirubin: 0.2 mg/dL — ABNORMAL LOW (ref 0.3–1.2)
Total Protein: 7.1 g/dL (ref 6.5–8.1)

## 2022-03-21 LAB — FERRITIN: Ferritin: 114 ng/mL (ref 11–307)

## 2022-03-21 LAB — FOLATE: Folate: 30 ng/mL (ref >5.9)

## 2022-03-21 LAB — VITAMIN B12: Vitamin B-12: 191 pg/mL (ref 180–914)

## 2022-03-21 NOTE — Progress Notes (Signed)
[place] [phone]   [phone]  PROGRESS NOTE  Patient Care Team: Ladell Pier, MD as PCP - General (Internal Medicine)  Hematological/Oncological History # Thrombocytosis # Leukocytosis #Iron Deficiency Anemia 2/2 to GYN Bleeding 1) 11/26/2010: WBC 16.3, Hgb 15.0, MCV 88.5, Plt 207. First CBC on record 2) 10/13/2015: WBC 8.7, Hgb 12.2, MCV 88.8, Plt 442 3) 07/11/2019: WBC 15.5, Hgb 13.7, MCV 91.7, Plt 842 4) 08/18/2019: WBC 12.5, Hgb 13.5, MCV 90.5, Plt 842 5) 09/17/2019: WBC 10.3, Hgb 11.4, MCV 89, Plt 656 6) 12/03/2019: establish care with Dr. Lorenso Courier  7) 03/10/2020: WBC 10.1, Hgb 11.4, MCV 88.3, Plt 505 8) 06/13/2020: WBC 9.8, Hgb 13.5, MCV 95.2, Plt 427 9) 09/28/2020: WBC 11.0, Hgb 13.2, MCV 95.9, Plt 374 10) 03/31/2021: WBC 9.0, Hgb 11.7, MCV 107.7, Plt 530 11) 06/30/2021: WBC 11.8, Hgb 9.9, MCV 105.3, Plt 616 12) 10/27/2021: WBC 11.6, Hgb 10.5, MCV 97.8, Plt 243, Iron 283, saturation 85%, ferritin, 31, vitamin B12 138, folate 4.7 13) 03/21/2022: WBC 9.2, Hgb 12.2, MCV 90.0, Plt 406, Iron 85, saturation 23%, Ferritin 114, vitamin B12 191, folate 30.0   Interval History:  Ellen Stone 36 y.o. female with medical history significant for iron deficiency anemia presents for a follow up visit. The patient's last visit was on 10/27/2021. In the interim since the last visit she is had no major changes in her health.  On exam today Ellen Stone report that her energy levels are down this past week due to being on her cycle. She does experience some nausea as well but no vomiting episodes. She reports that she had her nexplanon implanted in December 2022 which initially improved her cycles but are back to being heavy. She reports that each cycle last 6 days of heavy bleeding. She needs to change her pad every 2-3 hours. She denies fevers, chills, night sweats, shortness of breath, chest pain or cough. She has no other complaints. Rest of the 10 point ROS is  below.   MEDICAL HISTORY:  Past Medical History:  Diagnosis Date   Chronic bronchitis (HCC)    Cocaine abuse ([place])    History of trichomoniasis 01/27/2020   Hyperphosphatemia 10/20/2015   Hypertension    Low TSH level 09/29/2019   Recurrent UTI (urinary tract infection)    "none lately; since I started drinking more water" (10/18/2015)   Transaminitis 01/26/2021   Vitamin D deficiency 10/20/2015    SURGICAL HISTORY: Past Surgical History:  Procedure Laterality Date   FRACTURE SURGERY     ORIF PROXIMAL TIBIAL PLATEAU FRACTURE Left 10/18/2015   ORIF TIBIA PLATEAU Left 10/18/2015   Procedure: OPEN REDUCTION INTERNAL FIXATION (ORIF) LEFT TIBIAL PLATEAU;  Surgeon: Altamese Cornucopia, MD;  Location: [place];  Service: Orthopedics;  Laterality: Left;    SOCIAL HISTORY: Social History   Socioeconomic History   Marital status: Married    Spouse name: Not on file   Number of children: Not on file   Years of education: Not on file   Highest education level: Not on file  Occupational History   Not on file  Tobacco Use   Smoking status: Every Day    Packs/day: 1.00    Years: 10.00    Total pack years: 10.00    Types: Cigarettes   Smokeless tobacco: Never  Vaping Use   Vaping Use: Never used  Substance and Sexual Activity   Alcohol use: Yes    Comment: 10/18/2015 "down to maybe 1 beer q 2 weeks  or so"   Drug use: Yes    Types: Cocaine, Marijuana    Comment: 10/18/2015 "quit cocaine recently"   Sexual activity: Not Currently    Birth control/protection: None  Other Topics Concern   Not on file  Social History Narrative   Not on file   Social Determinants of Health   Financial Resource Strain: Not on file  Food Insecurity: Not on file  Transportation Needs: Not on file  Physical Activity: Not on file  Stress: Not on file  Social Connections: Not on file  Intimate Partner Violence: Not on file    FAMILY HISTORY: Family History  Problem Relation Age of Onset   Clotting disorder Mother     Heart Problems Father    Clotting disorder Maternal Uncle     ALLERGIES:  has No Known Allergies.  MEDICATIONS:  Current Outpatient Medications  Medication Sig Dispense Refill   cetirizine (ZYRTEC) 10 MG tablet TAKE 1 TABLET(10 MG) BY MOUTH DAILY 90 tablet 0   etonogestrel (NEXPLANON) 68 MG IMPL implant 68 mg by Subdermal route once. Implanted June 23, 2021     ferrous sulfate 325 (65 FE) MG EC tablet Take 1 tablet (325 mg total) by mouth daily with breakfast. 90 tablet 3   fluticasone (FLONASE) 50 MCG/ACT nasal spray SHAKE LIQUID AND USE 2 SPRAYS IN EACH NOSTRIL DAILY 48 g 0   folic acid (FOLVITE) 1 MG tablet Take 1 tablet (1 mg total) by mouth daily. 30 tablet 3   ibuprofen (ADVIL) 200 MG tablet Take 200 mg by mouth every 6 (six) hours as needed.     methimazole (TAPAZOLE) 5 MG tablet Take 1 tablet (5 mg total) by mouth daily. 30 tablet 3   Vitamin D, Ergocalciferol, (DRISDOL) 1.25 MG (50000 UNIT) CAPS capsule Take 1 capsule (50,000 Units total) by mouth every 7 (seven) days. 16 capsule 0   No current facility-administered medications for this visit.    REVIEW OF SYSTEMS:   Constitutional: ( - ) fevers, ( - )  chills , ( - ) night sweats Eyes: ( - ) blurriness of vision, ( - ) double vision, ( - ) watery eyes Ears, nose, mouth, throat, and face: ( - ) mucositis, ( - ) sore throat Respiratory: ( - ) cough, ( - ) dyspnea, ( - ) wheezes Cardiovascular: ( - ) palpitation, ( - ) chest discomfort, ( - ) lower extremity swelling Gastrointestinal:  ( - ) nausea, ( - ) heartburn, ( - ) change in bowel habits Skin: ( - ) abnormal skin rashes Lymphatics: ( - ) new lymphadenopathy, ( - ) easy bruising Neurological: ( - ) numbness, ( - ) tingling, ( - ) new weaknesses Behavioral/Psych: ( - ) mood change, ( - ) new changes  All other systems were reviewed with the patient and are negative.  PHYSICAL EXAMINATION: ECOG PERFORMANCE STATUS: 0 - Asymptomatic  Vitals:   03/21/22 1312  BP:  117/85  Pulse: 94  Resp: 17  Temp: (!) 97.2 F (36.2 C)  SpO2: 100%    Filed Weights   03/21/22 1312  Weight: 182 lb 6 oz (82.7 kg)     GENERAL: well appearing young [place] American female. alert, no distress and comfortable SKIN: skin color, texture, turgor are normal, no rashes or significant lesions EYES: conjunctiva are pink and non-injected, sclera clear LUNGS: clear to auscultation and percussion with normal breathing effort HEART: regular rate & rhythm and no murmurs and no lower extremity edema Musculoskeletal:  no cyanosis of digits and no clubbing  PSYCH: alert & oriented x 3, fluent speech NEURO: no focal motor/sensory deficits  LABORATORY DATA:  I have reviewed the data as listed    Latest Ref Rng & Units 03/21/2022   12:45 PM 10/27/2021    1:24 PM 08/24/2021    4:05 PM  CBC  WBC 4.0 - 10.5 K/uL 9.2  11.6  11.8   Hemoglobin 12.0 - 15.0 g/dL 12.2  10.5  12.3   Hematocrit 36.0 - 46.0 % 37.6  31.6  36.2   Platelets 150 - 400 K/uL 406  243  555        Latest Ref Rng & Units 03/21/2022   12:45 PM 12/05/2021    3:07 PM 10/27/2021    1:24 PM  CMP  Glucose 70 - 99 mg/dL 82   85   BUN 6 - 20 mg/dL 13   16   Creatinine 0.44 - 1.00 mg/dL 1.18   0.99   Sodium 135 - 145 mmol/L 137   139   Potassium 3.5 - 5.1 mmol/L 4.8  4.6  3.2   Chloride 98 - 111 mmol/L 110   113   CO2 22 - 32 mmol/L 19   21   Calcium 8.9 - 10.3 mg/dL 8.9   8.6   Total Protein 6.5 - 8.1 g/dL 7.1   6.5   Total Bilirubin 0.3 - 1.2 mg/dL 0.2   0.2   Alkaline Phos 38 - 126 U/L 62   47   AST 15 - 41 U/L 10   14   ALT 0 - 44 U/L 7   8     No results found for: "MPROTEIN" No results found for: "KPAFRELGTCHN", "LAMBDASER", "KAPLAMBRATIO"   RADIOGRAPHIC STUDIES: No results found.  ASSESSMENT & PLAN Ellen Stone 36 y.o. female with medical history significant for iron deficiency anemia presents for a follow up visit.   #Iron Deficiency Anemia 2/2 to GYN Blood loss --Labs today show anemia with Hgb  12.2. Iron panel shows no deficiency --continue PO ferrous sulfate '325mg'$  PO daily with a source of vitamin C.  --patient established with OB/GYN and has nexplanon to control menstrual bleeding but no longer helping. Advised to follow up with OB/GYN.  --No indication for IV iron at this time.   #Vitamin B12/Folate deficiency: --Labs today show vitamin B12 level is 191, folate level is 30.0 --currently on folic acid 1 mg once daily --On 11/03/21, received B12 injection weekly x 4 then transitioned to monthly injections. Most recent injection on 01/19/2022. Recommend to continue with monthly injections.    # Thrombocytosis, chronic.  # Leukocytosis, Resolved --leukocytosis resolved, etiology unclear. MPN workup with BCR/ABL FISH and JAK2 w/ reflex was negative.    Follow up: --Continue with monthly b12 injections x 6 --RTC in 6 months with labs \ No orders of the defined types were placed in this encounter.    All questions were answered. The patient knows to call the clinic with any problems, questions or concerns.  I have spent a total of 30 minutes minutes of face-to-face and non-face-to-face time, preparing to see the patient, performing a medically appropriate examination, counseling and educating the patient, ordering medications/tests, documenting clinical information in the electronic health record, and care coordination.   Dede Query PA-C Dept of Hematology and [place] at [facility] Phone: [phone]   03/21/2022 1:44 PM

## 2022-03-22 ENCOUNTER — Encounter: Payer: Self-pay | Admitting: Physician Assistant

## 2022-03-24 LAB — METHYLMALONIC ACID, SERUM: Methylmalonic Acid, Quantitative: 96 nmol/L (ref 0–378)

## 2022-03-27 ENCOUNTER — Telehealth: Payer: Self-pay

## 2022-03-27 ENCOUNTER — Other Ambulatory Visit: Payer: Self-pay

## 2022-03-27 DIAGNOSIS — E538 Deficiency of other specified B group vitamins: Secondary | ICD-10-CM

## 2022-03-27 MED ORDER — FOLIC ACID 1 MG PO TABS: 1.0000 mg | ORAL_TABLET | Freq: Every day | ORAL | 3 refills | Status: DC

## 2022-03-27 NOTE — Telephone Encounter (Signed)
-----   Message from Lincoln Brigham, PA-C sent at 03/27/2022  9:53 AM EDT ----- Please notify patient that no evidence of iron deficiency. Continue iron and folic acid pills. B12 is still low so recommend to continue with monthly B12 injections.    ----- Message ----- From: Interface, Lab In [place] Sent: 03/21/2022   1:06 PM EDT To: Lincoln Brigham, PA-C

## 2022-03-27 NOTE — Telephone Encounter (Signed)
Pt advised of lab results and to continue taking oral iron and folic acid and B 12 injections.   A refill was sent to [facility] for folic acid 1 mg per pt's request.

## 2022-04-20 ENCOUNTER — Other Ambulatory Visit: Payer: Self-pay

## 2022-04-20 ENCOUNTER — Inpatient Hospital Stay: Payer: Medicaid Other | Attending: Hematology and Oncology

## 2022-04-20 VITALS — BP 109/73 | HR 77 | Temp 98.3°F | Resp 18

## 2022-04-20 DIAGNOSIS — D500 Iron deficiency anemia secondary to blood loss (chronic): Secondary | ICD-10-CM | POA: Insufficient documentation

## 2022-04-20 DIAGNOSIS — E538 Deficiency of other specified B group vitamins: Secondary | ICD-10-CM

## 2022-04-20 DIAGNOSIS — N920 Excessive and frequent menstruation with regular cycle: Secondary | ICD-10-CM | POA: Insufficient documentation

## 2022-04-20 MED ORDER — CYANOCOBALAMIN 1000 MCG/ML IJ SOLN
1000.0000 ug | Freq: Once | INTRAMUSCULAR | Status: AC
  Administered 2022-04-20: 1000 ug via INTRAMUSCULAR
  Filled 2022-04-20: qty 1

## 2022-05-09 ENCOUNTER — Encounter: Payer: Self-pay | Admitting: Family Medicine

## 2022-05-09 ENCOUNTER — Ambulatory Visit (INDEPENDENT_AMBULATORY_CARE_PROVIDER_SITE_OTHER): Payer: Medicaid Other | Admitting: Family Medicine

## 2022-05-09 ENCOUNTER — Other Ambulatory Visit: Payer: Self-pay

## 2022-05-09 VITALS — BP 107/76 | HR 93 | Ht 71.0 in | Wt 192.2 lb

## 2022-05-09 DIAGNOSIS — Z3189 Encounter for other procreative management: Secondary | ICD-10-CM

## 2022-05-09 DIAGNOSIS — Z3046 Encounter for surveillance of implantable subdermal contraceptive: Secondary | ICD-10-CM

## 2022-05-09 DIAGNOSIS — N939 Abnormal uterine and vaginal bleeding, unspecified: Secondary | ICD-10-CM

## 2022-05-09 DIAGNOSIS — Z30013 Encounter for initial prescription of injectable contraceptive: Secondary | ICD-10-CM | POA: Diagnosis not present

## 2022-05-09 DIAGNOSIS — Z3009 Encounter for other general counseling and advice on contraception: Secondary | ICD-10-CM | POA: Diagnosis not present

## 2022-05-09 MED ORDER — MEDROXYPROGESTERONE ACETATE 150 MG/ML IM SUSP
150.0000 mg | Freq: Once | INTRAMUSCULAR | Status: AC
  Administered 2022-05-09: 150 mg via INTRAMUSCULAR

## 2022-05-09 NOTE — Progress Notes (Signed)
     GYNECOLOGY OFFICE PROCEDURE NOTE  Ellen Stone is a 36 y.o. G0P0000 here for Nexplanon removal.  Last pap smear was normal. No other gynecologic concerns.  Nexplanon removal Procedure Patient identified, informed consent performed, consent signed.   Appropriate time out taken. Nexplanon site identified.  Area prepped in usual sterile fashon. One ml of 1% lidocaine was used to anesthetize the area at the distal end of the implant. A small stab incision was made right beside the implant on the distal portion.  The Nexplanon rod was grasped using hemostats and removed without difficulty.  There was minimal blood loss. There were no complications.  3 ml of 1% lidocaine was injected around the incision for post-procedure analgesia.  Steri-strips were applied over the small incision.  A pressure bandage was applied to reduce any bruising.  The patient tolerated the procedure well and was given post procedure instructions.  Patient is planning to use depo for contraception/attempt conception.  Clarnce Flock, MD/[facility], [facility] for [employer], [place]

## 2022-05-09 NOTE — Assessment & Plan Note (Signed)
Discussed that unfortunately control of her cycle requires hormones that will not help her get pregnant. She would like nexplanon out and depo today while she discusses options with husband. Nexplanon removed without issue. Depo given. Follow up PRN.

## 2022-05-09 NOTE — Progress Notes (Signed)
GYNECOLOGY OFFICE VISIT NOTE  History:   Ellen Stone is a 36 y.o. G0P0000 here today for discussion of birth control.  Using nexplanon for birth control and control of AUB Pain is improved but still having heavy cycles Wants to get off so she can get pregnant but worried about having painful periods again Would like nexplanon out and depo given today while she discusses plan with her husband She is not interested in using pills  There are no preventive care reminders to display for this patient.  Past Medical History:  Diagnosis Date   Chronic bronchitis (HCC)    Cocaine abuse ([place])    History of trichomoniasis 01/27/2020   Hyperphosphatemia 10/20/2015   Hypertension    Low TSH level 09/29/2019   Recurrent UTI (urinary tract infection)    "none lately; since I started drinking more water" (10/18/2015)   Transaminitis 01/26/2021   Vitamin D deficiency 10/20/2015    Past Surgical History:  Procedure Laterality Date   FRACTURE SURGERY     ORIF PROXIMAL TIBIAL PLATEAU FRACTURE Left 10/18/2015   ORIF TIBIA PLATEAU Left 10/18/2015   Procedure: OPEN REDUCTION INTERNAL FIXATION (ORIF) LEFT TIBIAL PLATEAU;  Surgeon: Altamese Pine Lakes, MD;  Location: [place];  Service: Orthopedics;  Laterality: Left;    The following portions of the patient's history were reviewed and updated as appropriate: allergies, current medications, past family history, past medical history, past social history, past surgical history and problem list.   Health Maintenance:   Last pap: Lab Results  Component Value Date   DIAGPAP  09/17/2019    - Negative for intraepithelial lesion or malignancy (NILM)   Sabinal Negative 09/17/2019    Last mammogram:  N/a    Review of Systems:  Pertinent items noted in HPI and remainder of comprehensive ROS otherwise negative.  Physical Exam:  BP 107/76   Pulse 93   Ht '5\' 11"'$  (1.803 m)   Wt 192 lb 3.2 oz (87.2 kg)   LMP 05/07/2022 (Exact Date)   BMI 26.81 kg/m   CONSTITUTIONAL: Well-developed, well-nourished female in no acute distress.  HEENT:  Normocephalic, atraumatic. External right and left ear normal. No scleral icterus.  NECK: Normal range of motion, supple, no masses noted on observation SKIN: No rash noted. Not diaphoretic. No erythema. No pallor. MUSCULOSKELETAL: Normal range of motion. No edema noted. NEUROLOGIC: Alert and oriented to person, place, and time. Normal muscle tone coordination.  PSYCHIATRIC: Normal mood and affect. Normal behavior. Normal judgment and thought content. RESPIRATORY: Effort normal, no problems with respiration noted   Labs and Imaging No results found for this or any previous visit (from the past 168 hour(s)). No results found.    Assessment and Plan:  Encounter for fertility planning  Abnormal uterine bleeding (AUB)  Nexplanon removal  Problem List Items Addressed This Visit       Genitourinary   Abnormal uterine bleeding (AUB)    Discussed that unfortunately control of her cycle requires hormones that will not help her get pregnant. She would like nexplanon out and depo today while she discusses options with husband. Nexplanon removed without issue. Depo given. Follow up PRN.       Other Visit Diagnoses     Encounter for fertility planning    -  Primary   Nexplanon removal          Routine preventative health maintenance measures emphasized. Please refer to After Visit Summary for other counseling recommendations.   Return as needed.  Total face-to-face time with patient: 20 minutes.  Over 50% of encounter was spent on counseling and coordination of care.   Clarnce Flock, MD/MPH Attending Family Medicine Physician, [facility] for [facility], [place]

## 2022-05-09 NOTE — Addendum Note (Signed)
Addended by: Georgia Lopes on: 05/09/2022 03:17 PM   Modules accepted: Orders

## 2022-05-21 ENCOUNTER — Encounter: Payer: Self-pay | Admitting: General Practice

## 2022-05-21 ENCOUNTER — Inpatient Hospital Stay: Payer: Self-pay | Attending: Hematology and Oncology

## 2022-05-21 DIAGNOSIS — N920 Excessive and frequent menstruation with regular cycle: Secondary | ICD-10-CM | POA: Insufficient documentation

## 2022-05-21 DIAGNOSIS — E538 Deficiency of other specified B group vitamins: Secondary | ICD-10-CM | POA: Insufficient documentation

## 2022-05-21 DIAGNOSIS — D500 Iron deficiency anemia secondary to blood loss (chronic): Secondary | ICD-10-CM | POA: Insufficient documentation

## 2022-05-25 ENCOUNTER — Inpatient Hospital Stay: Payer: Self-pay

## 2022-05-25 ENCOUNTER — Other Ambulatory Visit: Payer: Self-pay

## 2022-05-25 DIAGNOSIS — E538 Deficiency of other specified B group vitamins: Secondary | ICD-10-CM

## 2022-05-25 MED ORDER — CYANOCOBALAMIN 1000 MCG/ML IJ SOLN
1000.0000 ug | Freq: Once | INTRAMUSCULAR | Status: AC
  Administered 2022-05-25: 1000 ug via INTRAMUSCULAR
  Filled 2022-05-25: qty 1

## 2022-06-15 ENCOUNTER — Encounter: Payer: Self-pay | Admitting: Physician Assistant

## 2022-06-20 ENCOUNTER — Other Ambulatory Visit: Payer: Self-pay

## 2022-06-20 ENCOUNTER — Inpatient Hospital Stay: Payer: Medicaid Other | Attending: Hematology and Oncology

## 2022-06-20 DIAGNOSIS — N920 Excessive and frequent menstruation with regular cycle: Secondary | ICD-10-CM | POA: Insufficient documentation

## 2022-06-20 DIAGNOSIS — D500 Iron deficiency anemia secondary to blood loss (chronic): Secondary | ICD-10-CM | POA: Diagnosis present

## 2022-06-20 DIAGNOSIS — E538 Deficiency of other specified B group vitamins: Secondary | ICD-10-CM | POA: Diagnosis present

## 2022-06-20 MED ORDER — CYANOCOBALAMIN 1000 MCG/ML IJ SOLN
1000.0000 ug | Freq: Once | INTRAMUSCULAR | Status: AC
  Administered 2022-06-20: 1000 ug via INTRAMUSCULAR
  Filled 2022-06-20: qty 1

## 2022-06-20 NOTE — Patient Instructions (Signed)
Vitamin B12 Deficiency Vitamin B12 deficiency occurs when the body does not have enough of this important vitamin. The body needs this vitamin: To make red blood cells. To make DNA. This is the genetic material inside cells. To help the nerves work properly so they can carry messages from the brain to the body. Vitamin B12 deficiency can cause health problems, such as not having enough red blood cells in the blood (anemia). This can lead to nerve damage if untreated. What are the causes? This condition may be caused by: Not eating enough foods that contain vitamin B12. Not having enough stomach acid and digestive fluids to properly absorb vitamin B12 from the food that you eat. Having certain diseases that make it hard to absorb vitamin B12. These diseases include Crohn's disease, chronic pancreatitis, and cystic fibrosis. An autoimmune disorder in which the body does not make enough of a protein (intrinsic factor) within the stomach, resulting in not enough absorption of vitamin B12. Having a surgery in which part of the stomach or small intestine is removed. Taking certain medicines that make it hard for the body to absorb vitamin B12. These include: Heartburn medicines, such as antacids and proton pump inhibitors. Some medicines that are used to treat diabetes. What increases the risk? The following factors may make you more likely to develop a vitamin B12 deficiency: Being an older adult. Eating a vegetarian or vegan diet that does not include any foods that come from animals. Eating a poor diet while you are pregnant. Taking certain medicines. Having alcoholism. What are the signs or symptoms? In some cases, there are no symptoms of this condition. If the condition leads to anemia or nerve damage, various symptoms may occur, such as: Weakness. Tiredness (fatigue). Loss of appetite. Numbness or tingling in your hands and feet. Redness and burning of the tongue. Depression,  confusion, or memory problems. Trouble walking. If anemia is severe, symptoms can include: Shortness of breath. Dizziness. Rapid heart rate. How is this diagnosed? This condition may be diagnosed with a blood test to measure the level of vitamin B12 in your blood. You may also have other tests, including: A group of tests that measure certain characteristics of blood cells (complete blood count, CBC). A blood test to measure intrinsic factor. A procedure where a thin tube with a camera on the end is used to look into your stomach or intestines (endoscopy). Other tests may be needed to discover the cause of the deficiency. How is this treated? Treatment for this condition depends on the cause. This condition may be treated by: Changing your eating and drinking habits, such as: Eating more foods that contain vitamin B12. Drinking less alcohol or no alcohol. Getting vitamin B12 injections. Taking vitamin B12 supplements by mouth (orally). Your health care provider will tell you which dose is best for you. Follow these instructions at home: Eating and drinking  Include foods in your diet that come from animals and contain a lot of vitamin B12. These include: Meats and poultry. This includes beef, pork, chicken, turkey, and organ meats, such as liver. Seafood. This includes clams, rainbow trout, salmon, tuna, and haddock. Eggs. Dairy foods such as milk, yogurt, and cheese. Eat foods that have vitamin B12 added to them (are fortified), such as ready-to-eat breakfast cereals. Check the label on the package to see if a food is fortified. The items listed above may not be a complete list of foods and beverages you can eat and drink. Contact a dietitian for   more information. Alcohol use Do not drink alcohol if: Your health care provider tells you not to drink. You are pregnant, may be pregnant, or are planning to become pregnant. If you drink alcohol: Limit how much you have to: 0-1 drink a  day for women. 0-2 drinks a day for men. Know how much alcohol is in your drink. In the [place] one drink equals one 12 oz bottle of beer (355 mL), one 5 oz glass of wine (148 mL), or one 1 oz glass of hard liquor (44 mL). General instructions Get vitamin B12 injections if told to by your health care provider. Take supplements only as told by your health care provider. Follow the directions carefully. Keep all follow-up visits. This is important. Contact a health care provider if: Your symptoms come back. Your symptoms get worse or do not improve with treatment. Get help right away: You develop shortness of breath. You have a rapid heart rate. You have chest pain. You become dizzy or you faint. These symptoms may be an emergency. Get help right away. Call 911. Do not wait to see if the symptoms will go away. Do not drive yourself to the hospital. Summary Vitamin B12 deficiency occurs when the body does not have enough of this important vitamin. Common causes include not eating enough foods that contain vitamin B12, not being able to absorb vitamin B12 from the food that you eat, having a surgery in which part of the stomach or small intestine is removed, or taking certain medicines. Eat foods that have vitamin B12 in them. Treatment may include making a change in the way you eat and drink, getting vitamin B12 injections, or taking vitamin B12 supplements. This information is not intended to replace advice given to you by your health care provider. Make sure you discuss any questions you have with your health care provider. Document Revised: 02/24/2021 Document Reviewed: 02/24/2021 Elsevier Patient Education  2023 [employer]  

## 2022-07-03 ENCOUNTER — Ambulatory Visit
Admission: EM | Admit: 2022-07-03 | Discharge: 2022-07-03 | Disposition: A | Discharge status: Home / Self Care | Payer: Medicaid Other | Attending: Physician Assistant | Admitting: Physician Assistant

## 2022-07-03 DIAGNOSIS — K047 Periapical abscess without sinus: Secondary | ICD-10-CM | POA: Diagnosis not present

## 2022-07-03 DIAGNOSIS — R519 Headache, unspecified: Secondary | ICD-10-CM | POA: Diagnosis not present

## 2022-07-03 DIAGNOSIS — R110 Nausea: Secondary | ICD-10-CM | POA: Diagnosis not present

## 2022-07-03 MED ORDER — ONDANSETRON 4 MG PO TBDP: 4.0000 mg | ORAL_TABLET | Freq: Three times a day (TID) | ORAL | 0 refills | Status: DC | PRN

## 2022-07-03 MED ORDER — AMOXICILLIN-POT CLAVULANATE 875-125 MG PO TABS: 1.0000 | ORAL_TABLET | Freq: Two times a day (BID) | ORAL | 0 refills | Status: DC

## 2022-07-03 NOTE — ED Triage Notes (Signed)
Pt presents to uc with co of fatigue nausea vomiting since Sunday night. Pt reports using motrin and ginger ale with minimal improvement. Pt also reports right sided facial pain as well, recent dental procedure.

## 2022-07-03 NOTE — ED Provider Notes (Addendum)
[facility]    CSN: [id number] Arrival date & time: 07/03/22  1728      History   Chief Complaint Chief Complaint  Patient presents with   Nausea   Fatigue    HPI Ellen Stone is a 36 y.o. female.   Patient here today for evaluation of fatigue, nausea that started 3 days ago. She reports that she has had pain to the right side of her face after recent dental procedure. She reports she is currently taking amoxicillin for this and has been for 2 days prior to development of nausea. She has not been vomiting because she reports she has not been eating. She does not feel her facial pain is related to her dental issues as she does not have teeth present on that side. She does not have fever. She has tried taking motrin and ginger ale without significant improvement.   The history is provided by the patient.    Past Medical History:  Diagnosis Date   Chronic bronchitis (HCC)    Cocaine abuse ([place])    History of trichomoniasis 01/27/2020   Hyperphosphatemia 10/20/2015   Hypertension    Low TSH level 09/29/2019   Recurrent UTI (urinary tract infection)    "none lately; since I started drinking more water" (10/18/2015)   Transaminitis 01/26/2021   Vitamin D deficiency 10/20/2015    Patient Active Problem List   Diagnosis Date Noted   B12 deficiency 10/27/2021   Abnormal uterine bleeding (AUB) 01/26/2021   Hypertension    Adenomyosis 09/29/2019   Thrombocytosis 09/17/2019   Vitamin D deficiency 10/20/2015   Hyperphosphatemia 10/20/2015    Past Surgical History:  Procedure Laterality Date   FRACTURE SURGERY     ORIF PROXIMAL TIBIAL PLATEAU FRACTURE Left 10/18/2015   ORIF TIBIA PLATEAU Left 10/18/2015   Procedure: OPEN REDUCTION INTERNAL FIXATION (ORIF) LEFT TIBIAL PLATEAU;  Surgeon: Altamese Linden, MD;  Location: [place];  Service: Orthopedics;  Laterality: Left;    OB History     Gravida  0   Para  0   Term  0   Preterm  0   AB  0   Living  0      SAB   0   IAB  0   Ectopic  0   Multiple  0   Live Births  0            Home Medications    Prior to Admission medications   Medication Sig Start Date End Date Taking? Authorizing Provider  amoxicillin-clavulanate (AUGMENTIN) 875-125 MG tablet Take 1 tablet by mouth every 12 (twelve) hours. 07/03/22  Yes Francene Finders, PA-C  ondansetron (ZOFRAN-ODT) 4 MG disintegrating tablet Take 1 tablet (4 mg total) by mouth every 8 (eight) hours as needed. 07/03/22  Yes Francene Finders, PA-C  cetirizine (ZYRTEC) 10 MG tablet TAKE 1 TABLET(10 MG) BY MOUTH DAILY 02/21/22   Ladell Pier, MD  ferrous sulfate 325 (65 FE) MG EC tablet Take 1 tablet (325 mg total) by mouth daily with breakfast. 06/30/21   Orson Slick, MD  fluticasone [facility]) 50 MCG/ACT nasal spray SHAKE LIQUID AND USE 2 SPRAYS IN EACH NOSTRIL DAILY 02/21/22   Ladell Pier, MD  folic acid (FOLVITE) 1 MG tablet Take 1 tablet (1 mg total) by mouth daily. 03/27/22   Lincoln Brigham, PA-C  ibuprofen (ADVIL) 200 MG tablet Take 200 mg by mouth every 6 (six) hours as needed.  [provider]  methimazole (TAPAZOLE) 5 MG tablet Take 1 tablet (5 mg total) by mouth daily. 12/14/21   Ladell Pier, MD  Vitamin D, Ergocalciferol, (DRISDOL) 1.25 MG (50000 UNIT) CAPS capsule Take 1 capsule (50,000 Units total) by mouth every 7 (seven) days. 08/30/21   Argentina Donovan, PA-C    Family History Family History  Problem Relation Age of Onset   Clotting disorder Mother    Heart Problems Father    Clotting disorder Maternal Uncle     Social History Social History   Tobacco Use   Smoking status: Every Day    Packs/day: 0.50    Years: 10.00    Total pack years: 5.00    Types: Cigarettes   Smokeless tobacco: Never  Vaping Use   Vaping Use: Never used  Substance Use Topics   Alcohol use: Yes    Comment: 10/18/2015 "down to maybe 1 beer q 2 weeks or so"   Drug use: Yes    Types: Marijuana    Comment: 10/18/2015 "quit  cocaine recently"     Allergies   Patient has no known allergies.   Review of Systems Review of Systems  Constitutional:  Positive for fatigue. Negative for chills and fever.  HENT:  Negative for congestion, ear pain and sore throat.   Eyes:  Negative for discharge and redness.  Respiratory:  Negative for cough, shortness of breath and wheezing.   Gastrointestinal:  Positive for nausea. Negative for diarrhea and vomiting.     Physical Exam Triage Vital Signs ED Triage Vitals  Enc Vitals Group     BP 07/03/22 1832 (!) 131/91     Pulse Rate 07/03/22 1832 92     Resp 07/03/22 1832 16     Temp 07/03/22 1832 98.3 F (36.8 C)     Temp Source 07/03/22 1832 Oral     SpO2 07/03/22 1832 98 %     Weight --      Height --      Head Circumference --      Peak Flow --      Pain Score 07/03/22 1831 4     Pain Loc --      Pain Edu? --      Excl. in [place]? --    No data found.  Updated Vital Signs BP (!) 131/91   Pulse 92   Temp 98.3 F (36.8 C) (Oral)   Resp 16   LMP 06/27/2022 (Exact Date)   SpO2 98%   Physical Exam Vitals and nursing note reviewed.  Constitutional:      General: She is not in acute distress.    Appearance: Normal appearance. She is not ill-appearing.  HENT:     Head: Normocephalic and atraumatic.     Right Ear: Tympanic membrane normal.     Left Ear: Tympanic membrane normal.     Nose: Congestion present.     Mouth/Throat:     Mouth: Mucous membranes are moist.     Pharynx: No oropharyngeal exudate or posterior oropharyngeal erythema.     Comments: Erythematous gumline to posterior lower right mouth- purulent drainage noted Eyes:     Conjunctiva/sclera: Conjunctivae normal.  Cardiovascular:     Rate and Rhythm: Normal rate and regular rhythm.     Heart sounds: Normal heart sounds. No murmur heard. Pulmonary:     Effort: Pulmonary effort is normal. No respiratory distress.     Breath sounds: Normal breath sounds. No wheezing, rhonchi or rales.  Skin:    General: Skin is warm and dry.  Neurological:     Mental Status: She is alert.  Psychiatric:        Mood and Affect: Mood normal.        Thought Content: Thought content normal.      UC Treatments / Results  Labs (all labs ordered are listed, but only abnormal results are displayed) Labs Reviewed - No data to display  EKG   Radiology No results found.  Procedures Procedures (including critical care time)  Medications Ordered in UC Medications - No data to display  Initial Impression / Assessment and Plan / UC Course  I have reviewed the triage vital signs and the nursing notes.  Pertinent labs & imaging results that were available during my care of the patient were reviewed by me and considered in my medical decision making (see chart for details).    I suspect that nausea is likely secondary to taking antibiotic on empty stomach. Given purulent drainage will trial switch of antibiotic to augmentin and zofran prescribed for nausea with strict instruction to eat prior to taking antibiotic. Recommended further evaluation in the ED if she has any worsening or persistent facial pain as she may need imaging at that time. Patient expresses understanding.   Final Clinical Impressions(s) / UC Diagnoses   Final diagnoses:  Facial pain  Nausea without vomiting  Dental abscess   Discharge Instructions   None    ED Prescriptions     Medication Sig Dispense Auth. Provider   amoxicillin-clavulanate (AUGMENTIN) 875-125 MG tablet Take 1 tablet by mouth every 12 (twelve) hours. 14 tablet Ewell Poe F, PA-C   ondansetron (ZOFRAN-ODT) 4 MG disintegrating tablet Take 1 tablet (4 mg total) by mouth every 8 (eight) hours as needed. 20 tablet Francene Finders, PA-C      PDMP not reviewed this encounter.   Francene Finders, PA-C 07/03/22 1914    Francene Finders, PA-C 07/03/22 [phone]

## 2022-07-05 ENCOUNTER — Telehealth: Payer: Self-pay

## 2022-07-05 MED ORDER — CLINDAMYCIN HCL 300 MG PO CAPS: 300.0000 mg | ORAL_CAPSULE | Freq: Three times a day (TID) | ORAL | 0 refills | Status: DC

## 2022-07-05 NOTE — Telephone Encounter (Signed)
Patient called clinic requesting change in antibiotic prescription. She reports amoxicillin allergy and Augmentin was prescribed. She took two doses and did not tolerate well. She reports nausea, vomiting, abdominal cramping, and dizziness when taking. She reports taking it with food and had same outcome. Instructed to stop the medication, consulted with [place], MD who changed her prescription to Clindamycin. Patient informed voiced understanding.,

## 2022-07-12 ENCOUNTER — Encounter: Payer: Self-pay | Admitting: Student

## 2022-07-12 ENCOUNTER — Ambulatory Visit (INDEPENDENT_AMBULATORY_CARE_PROVIDER_SITE_OTHER): Payer: Medicaid Other | Admitting: Student

## 2022-07-12 VITALS — BP 122/80 | HR 136 | Temp 98.1°F | Ht 71.0 in | Wt 190.6 lb

## 2022-07-12 DIAGNOSIS — Z7689 Persons encountering health services in other specified circumstances: Secondary | ICD-10-CM | POA: Diagnosis present

## 2022-07-12 DIAGNOSIS — T7840XA Allergy, unspecified, initial encounter: Secondary | ICD-10-CM | POA: Insufficient documentation

## 2022-07-12 DIAGNOSIS — E0590 Thyrotoxicosis, unspecified without thyrotoxic crisis or storm: Secondary | ICD-10-CM | POA: Insufficient documentation

## 2022-07-12 NOTE — Assessment & Plan Note (Signed)
Patient notes hx of hyperthyroidism on methimazole

## 2022-07-12 NOTE — Progress Notes (Signed)
  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, 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  Sex: Depot for birth control/bleeding, using condom's inconsistent, 2019 tested for STI  Tobacco: Cigarettes 2-3 packs a week, for 13 years, THC an 1/8th every 2-3 days  EtOH: none, holidays and birthdays  Drugs: Crack cocaine hx 4 years ago    PERTINENT  PMH / [place]:   Past Medical History:  Diagnosis Date   Chronic bronchitis ([place])    Cocaine abuse ([place])    History of trichomoniasis 01/27/2020   Hyperphosphatemia 10/20/2015   Hypertension    Low TSH level 09/29/2019   Recurrent UTI (urinary tract infection)    "none lately; since I started drinking more water" (10/18/2015)   Transaminitis 01/26/2021   Vitamin D deficiency 10/20/2015    OBJECTIVE:  LMP 06/27/2022 (Exact Date)  Physical Exam Constitutional:      Appearance: Normal appearance.  Cardiovascular:     Rate and Rhythm: Normal rate and regular rhythm.     Pulses: Normal pulses.     Heart sounds: Normal heart sounds. No murmur heard.    No friction rub. No gallop.  Pulmonary:     Effort: Pulmonary effort is normal. No respiratory distress.     Breath sounds: Normal breath sounds. No stridor. No wheezing, rhonchi or rales.  Abdominal:     General: Bowel sounds are normal. There is no distension.     Palpations: Abdomen is soft.     Tenderness: There is no abdominal tenderness. There is no guarding.   Neurological:     Mental Status: She is alert.      ASSESSMENT/PLAN:  There are no diagnoses linked to this encounter. No follow-ups on file. Holley Bouche, MD 07/12/2022, 7:41 AM PGY-2, [place]

## 2022-07-12 NOTE — Patient Instructions (Signed)
It was great to see you! Thank you for allowing me to participate in your care!  We saw you today to establish care! It was a pleasure to get to know you!  Schedule check up in the new year, at your convience and we can also discuss an problems you are having!   Take care and seek immediate care sooner if you develop any concerns.   Dr. Holley Bouche, MD [place]

## 2022-07-20 ENCOUNTER — Other Ambulatory Visit: Payer: Self-pay

## 2022-07-20 ENCOUNTER — Ambulatory Visit: Payer: Medicaid Other

## 2022-07-20 ENCOUNTER — Ambulatory Visit: Payer: Self-pay | Admitting: Internal Medicine

## 2022-07-20 ENCOUNTER — Inpatient Hospital Stay: Payer: Medicaid Other | Attending: Hematology and Oncology

## 2022-07-20 DIAGNOSIS — D500 Iron deficiency anemia secondary to blood loss (chronic): Secondary | ICD-10-CM | POA: Insufficient documentation

## 2022-07-20 DIAGNOSIS — E538 Deficiency of other specified B group vitamins: Secondary | ICD-10-CM | POA: Diagnosis present

## 2022-07-20 DIAGNOSIS — N920 Excessive and frequent menstruation with regular cycle: Secondary | ICD-10-CM | POA: Insufficient documentation

## 2022-07-20 MED ORDER — CYANOCOBALAMIN 1000 MCG/ML IJ SOLN
1000.0000 ug | Freq: Once | INTRAMUSCULAR | Status: AC
  Administered 2022-07-20: 1000 ug via INTRAMUSCULAR
  Filled 2022-07-20: qty 1

## 2022-07-20 NOTE — Patient Instructions (Signed)
Vitamin B12 Deficiency Vitamin B12 deficiency means that your body does not have enough vitamin B12. The body needs this important vitamin: To make red blood cells. To make genes (DNA). To help the nerves work. If you do not have enough vitamin B12 in your body, you can have health problems, such as not having enough red blood cells in the blood (anemia). What are the causes? Not eating enough foods that contain vitamin B12. Not being able to take in (absorb) vitamin B12 from the food that you eat. Certain diseases. A condition in which the body does not make enough of a certain protein. This results in your body not taking in enough vitamin B12. Having a surgery in which part of the stomach or small intestine is taken out. Taking medicines that make it hard for the body to take in vitamin B12. These include: Heartburn medicines. Some medicines that are used to treat diabetes. What increases the risk? Being an older adult. Eating a vegetarian or vegan diet that does not include any foods that come from animals. Not eating enough foods that contain vitamin B12 while you are pregnant. Taking certain medicines. Having alcoholism. What are the signs or symptoms? In some cases, there are no symptoms. If the condition leads to too few blood cells or nerve damage, symptoms can occur, such as: Feeling weak or tired. Not being hungry. Losing feeling (numbness) or tingling in your hands and feet. Redness and burning of the tongue. Feeling sad (depressed). Confusion or memory problems. Trouble walking. If anemia is very bad, symptoms can include: Being short of breath. Being dizzy. Having a very fast heartbeat. How is this treated? Changing the way you eat and drink, such as: Eating more foods that contain vitamin B12. Drinking little or no alcohol. Getting vitamin B12 shots. Taking vitamin B12 supplements by mouth (orally). Your doctor will tell you the dose that is best for you. Follow  these instructions at home: Eating and drinking  Eat foods that come from animals and have a lot of vitamin B12 in them. These include: Meats and poultry. This includes beef, pork, chicken, turkey, and organ meats, such as liver. Seafood, such as clams, rainbow trout, salmon, tuna, and haddock. Eggs. Dairy foods such as milk, yogurt, and cheese. Eat breakfast cereals that have vitamin B12 added to them (are fortified). Check the label. The items listed above may not be a complete list of foods and beverages you can eat and drink. Contact a dietitian for more information. Alcohol use Do not drink alcohol if: Your doctor tells you not to drink. You are pregnant, may be pregnant, or are planning to become pregnant. If you drink alcohol: Limit how much you have to: 0-1 drink a day for women. 0-2 drinks a day for men. Know how much alcohol is in your drink. In the [place] one drink equals one 12 oz bottle of beer (355 mL), one 5 oz glass of wine (148 mL), or one 1 oz glass of hard liquor (44 mL). General instructions Get any vitamin B12 shots if told by your doctor. Take supplements only as told by your doctor. Follow the directions. Keep all follow-up visits. Contact a doctor if: Your symptoms come back. Your symptoms get worse or do not get better with treatment. Get help right away if: You have trouble breathing. You have a very fast heartbeat. You have chest pain. You get dizzy. You faint. These symptoms may be an emergency. Get help right away. Call 911.   Do not wait to see if the symptoms will go away. Do not drive yourself to the hospital. Summary Vitamin B12 deficiency means that your body is not getting enough of the vitamin. In some cases, there are no symptoms of this condition. Treatment may include making a change in the way you eat and drink, getting shots, or taking supplements. Eat foods that have vitamin B12 in them. This information is not intended to replace advice  given to you by your health care provider. Make sure you discuss any questions you have with your health care provider. Document Revised: 02/24/2021 Document Reviewed: 02/24/2021 Elsevier Patient Education  2023 Elsevier Inc.  

## 2022-07-21 ENCOUNTER — Other Ambulatory Visit: Payer: Self-pay | Admitting: Internal Medicine

## 2022-07-21 ENCOUNTER — Other Ambulatory Visit: Payer: Self-pay | Admitting: Hematology and Oncology

## 2022-07-21 DIAGNOSIS — E0590 Thyrotoxicosis, unspecified without thyrotoxic crisis or storm: Secondary | ICD-10-CM

## 2022-07-23 NOTE — Telephone Encounter (Signed)
Requested medication (s) are due for refill today:   Provider to review  Requested medication (s) are on the active medication list:   Yes  Future visit scheduled:   Looks like pt has established care with Dr. Holley Bouche with [facility] on 07/12/2022   Last ordered: 12/14/2021 #30, 3 refills By Dr. Karle Plumber   Returned because it's  non delegated refill and has established with another provider.    Requested Prescriptions  Pending Prescriptions Disp Refills   methimazole (TAPAZOLE) 5 MG tablet [Pharmacy Med Name: METHIMAZOLE '5MG'$  TABLETS] 30 tablet 3    Sig: TAKE 1 TABLET(5 MG) BY MOUTH DAILY     Not Delegated - Endocrinology:  Hyperthyroid Agents Failed - 07/21/2022  6:47 AM      Failed - This refill cannot be delegated      Failed - TSH in normal range and within 180 days    TSH  Date Value Ref Range Status  12/05/2021 2.420 0.450 - 4.500 uIU/mL Final         Failed - T3 Total in normal range and within 180 days    T3, Free  Date Value Ref Range Status  12/05/2021 2.0 2.0 - 4.4 pg/mL Final         Failed - T4 free in normal range and within 180 days    T4, Total  Date Value Ref Range Status  08/24/2021 4.5 4.5 - 12.0 ug/dL Final   Free T4  Date Value Ref Range Status  12/05/2021 0.79 (L) 0.82 - 1.77 ng/dL Final         Failed - Valid encounter within last 6 months    Recent Outpatient Visits           7 months ago Seasonal allergic rhinitis due to pollen   [place], [place]   9 months ago Other chronic sinusitis   [place], [name], MD   11 months ago Vitamin D deficiency   [place], [name]   1 year ago Iron deficiency   [place], [name], MD   1 year ago Mandibular pain   [facility] Ladell Pier, MD

## 2022-08-17 ENCOUNTER — Other Ambulatory Visit: Payer: Self-pay | Admitting: Physician Assistant

## 2022-08-17 DIAGNOSIS — E538 Deficiency of other specified B group vitamins: Secondary | ICD-10-CM

## 2022-08-21 ENCOUNTER — Ambulatory Visit: Payer: Medicaid Other

## 2022-08-21 ENCOUNTER — Inpatient Hospital Stay: Payer: Medicaid Other | Attending: Hematology and Oncology

## 2022-08-21 DIAGNOSIS — N920 Excessive and frequent menstruation with regular cycle: Secondary | ICD-10-CM | POA: Insufficient documentation

## 2022-08-21 DIAGNOSIS — E538 Deficiency of other specified B group vitamins: Secondary | ICD-10-CM | POA: Diagnosis present

## 2022-08-21 DIAGNOSIS — D500 Iron deficiency anemia secondary to blood loss (chronic): Secondary | ICD-10-CM | POA: Insufficient documentation

## 2022-08-21 MED ORDER — CYANOCOBALAMIN 1000 MCG/ML IJ SOLN
1000.0000 ug | Freq: Once | INTRAMUSCULAR | Status: AC
  Administered 2022-08-21: 1000 ug via INTRAMUSCULAR
  Filled 2022-08-21: qty 1

## 2022-08-21 NOTE — Patient Instructions (Signed)
Vitamin B12 Deficiency Vitamin B12 deficiency occurs when the body does not have enough of this important vitamin. The body needs this vitamin: To make red blood cells. To make DNA. This is the genetic material inside cells. To help the nerves work properly so they can carry messages from the brain to the body. Vitamin B12 deficiency can cause health problems, such as not having enough red blood cells in the blood (anemia). This can lead to nerve damage if untreated. What are the causes? This condition may be caused by: Not eating enough foods that contain vitamin B12. Not having enough stomach acid and digestive fluids to properly absorb vitamin B12 from the food that you eat. Having certain diseases that make it hard to absorb vitamin B12. These diseases include Crohn's disease, chronic pancreatitis, and cystic fibrosis. An autoimmune disorder in which the body does not make enough of a protein (intrinsic factor) within the stomach, resulting in not enough absorption of vitamin B12. Having a surgery in which part of the stomach or small intestine is removed. Taking certain medicines that make it hard for the body to absorb vitamin B12. These include: Heartburn medicines, such as antacids and proton pump inhibitors. Some medicines that are used to treat diabetes. What increases the risk? The following factors may make you more likely to develop a vitamin B12 deficiency: Being an older adult. Eating a vegetarian or vegan diet that does not include any foods that come from animals. Eating a poor diet while you are pregnant. Taking certain medicines. Having alcoholism. What are the signs or symptoms? In some cases, there are no symptoms of this condition. If the condition leads to anemia or nerve damage, various symptoms may occur, such as: Weakness. Tiredness (fatigue). Loss of appetite. Numbness or tingling in your hands and feet. Redness and burning of the tongue. Depression,  confusion, or memory problems. Trouble walking. If anemia is severe, symptoms can include: Shortness of breath. Dizziness. Rapid heart rate. How is this diagnosed? This condition may be diagnosed with a blood test to measure the level of vitamin B12 in your blood. You may also have other tests, including: A group of tests that measure certain characteristics of blood cells (complete blood count, CBC). A blood test to measure intrinsic factor. A procedure where a thin tube with a camera on the end is used to look into your stomach or intestines (endoscopy). Other tests may be needed to discover the cause of the deficiency. How is this treated? Treatment for this condition depends on the cause. This condition may be treated by: Changing your eating and drinking habits, such as: Eating more foods that contain vitamin B12. Drinking less alcohol or no alcohol. Getting vitamin B12 injections. Taking vitamin B12 supplements by mouth (orally). Your health care provider will tell you which dose is best for you. Follow these instructions at home: Eating and drinking  Include foods in your diet that come from animals and contain a lot of vitamin B12. These include: Meats and poultry. This includes beef, pork, chicken, turkey, and organ meats, such as liver. Seafood. This includes clams, rainbow trout, salmon, tuna, and haddock. Eggs. Dairy foods such as milk, yogurt, and cheese. Eat foods that have vitamin B12 added to them (are fortified), such as ready-to-eat breakfast cereals. Check the label on the package to see if a food is fortified. The items listed above may not be a complete list of foods and beverages you can eat and drink. Contact a dietitian for   more information. Alcohol use Do not drink alcohol if: Your health care provider tells you not to drink. You are pregnant, may be pregnant, or are planning to become pregnant. If you drink alcohol: Limit how much you have to: 0-1 drink a  day for women. 0-2 drinks a day for men. Know how much alcohol is in your drink. In the [place] one drink equals one 12 oz bottle of beer (355 mL), one 5 oz glass of wine (148 mL), or one 1 oz glass of hard liquor (44 mL). General instructions Get vitamin B12 injections if told to by your health care provider. Take supplements only as told by your health care provider. Follow the directions carefully. Keep all follow-up visits. This is important. Contact a health care provider if: Your symptoms come back. Your symptoms get worse or do not improve with treatment. Get help right away: You develop shortness of breath. You have a rapid heart rate. You have chest pain. You become dizzy or you faint. These symptoms may be an emergency. Get help right away. Call 911. Do not wait to see if the symptoms will go away. Do not drive yourself to the hospital. Summary Vitamin B12 deficiency occurs when the body does not have enough of this important vitamin. Common causes include not eating enough foods that contain vitamin B12, not being able to absorb vitamin B12 from the food that you eat, having a surgery in which part of the stomach or small intestine is removed, or taking certain medicines. Eat foods that have vitamin B12 in them. Treatment may include making a change in the way you eat and drink, getting vitamin B12 injections, or taking vitamin B12 supplements. This information is not intended to replace advice given to you by your health care provider. Make sure you discuss any questions you have with your health care provider. Document Revised: 02/24/2021 Document Reviewed: 02/24/2021 Elsevier Patient Education  2023 [employer]  

## 2022-09-20 ENCOUNTER — Other Ambulatory Visit: Payer: Self-pay | Admitting: Physician Assistant

## 2022-09-20 DIAGNOSIS — E538 Deficiency of other specified B group vitamins: Secondary | ICD-10-CM

## 2022-09-20 DIAGNOSIS — D500 Iron deficiency anemia secondary to blood loss (chronic): Secondary | ICD-10-CM

## 2022-09-21 ENCOUNTER — Inpatient Hospital Stay: Payer: Medicaid Other | Admitting: Physician Assistant

## 2022-09-21 ENCOUNTER — Ambulatory Visit: Payer: Medicaid Other | Admitting: Hematology and Oncology

## 2022-09-21 ENCOUNTER — Other Ambulatory Visit: Payer: Medicaid Other

## 2022-09-21 ENCOUNTER — Ambulatory Visit: Payer: Medicaid Other

## 2022-09-21 ENCOUNTER — Inpatient Hospital Stay: Payer: Medicaid Other

## 2022-09-21 ENCOUNTER — Inpatient Hospital Stay: Payer: Medicaid Other | Attending: Hematology and Oncology

## 2022-10-12 ENCOUNTER — Telehealth: Payer: Self-pay | Admitting: Hematology and Oncology

## 2022-10-12 NOTE — Telephone Encounter (Signed)
Patient called to reschedule her missed appointments.

## 2022-10-18 ENCOUNTER — Ambulatory Visit (INDEPENDENT_AMBULATORY_CARE_PROVIDER_SITE_OTHER): Payer: Medicaid Other | Admitting: Student

## 2022-10-18 ENCOUNTER — Encounter: Payer: Self-pay | Admitting: Student

## 2022-10-18 VITALS — BP 98/58 | HR 93 | Ht 71.0 in | Wt 196.0 lb

## 2022-10-18 DIAGNOSIS — Z131 Encounter for screening for diabetes mellitus: Secondary | ICD-10-CM

## 2022-10-18 DIAGNOSIS — E0590 Thyrotoxicosis, unspecified without thyrotoxic crisis or storm: Secondary | ICD-10-CM

## 2022-10-18 DIAGNOSIS — Z111 Encounter for screening for respiratory tuberculosis: Secondary | ICD-10-CM

## 2022-10-18 DIAGNOSIS — Z13220 Encounter for screening for lipoid disorders: Secondary | ICD-10-CM

## 2022-10-18 DIAGNOSIS — Z1159 Encounter for screening for other viral diseases: Secondary | ICD-10-CM

## 2022-10-18 MED ORDER — METHIMAZOLE 5 MG PO TABS: 5.0000 mg | ORAL_TABLET | Freq: Every day | ORAL | 0 refills | Status: DC
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
## 2022-10-18 NOTE — Patient Instructions (Addendum)
It was great to see you! Thank you for allowing me to participate in your care!  I recommend that you always bring your medications to each appointment as this makes it easy to ensure we are on the correct medications and helps [place] not miss when refills are needed.  Our plans for today:  -Referral to [facility] for managing hyperthyroidism and trying to get pregnant  -Referral to Endocrine for management of methimazole vs other medication  - Checking Thyroid  TSH level  - Checking for Diabetes  A1c  - Checking for Hepatitis   Hep B and Hep C testing  - Checking Cholesterol  Lipid panel  - Checking for TB  Quantiferon testing  - Therapy resources  See Below  We are checking some labs today, I will call you if they are abnormal will send you a MyChart message or a letter if they are normal.  If you do not hear about your labs in the next 2 weeks please let us know.  Take care and seek immediate care sooner if you develop any concerns.   Find a therapist at [email or web address]  Or list of therapist below!   Therapy and Counseling Resources Most providers on this list will take Medicaid. Patients with commercial insurance or Medicare should contact their insurance company to get a list of in network providers.  [employer] (takes children) Location 1: [address] [place], [place] [address] Location 2: [place], [place] [address] [place] ([place] speaking therapist available)(habla espanol)(take medicare and medicaid)  [place], [place], [place] [address], [place] [email or web address] [phone]  [facility]:Psychiatry and Counseling [address] [place], [place] [address] [place], [place], [place] [address]      [phone]  [place] (spanish available) [place], [place] [address] [place] (take [facility] and medicare) [address] [place], [place] [address]       [phone]     [place] (virtual only) [phone]  Jinny Blossom [facility] [address] [place], [place]  [facility]:  [place]. [place] [phone]  [facility]:  [place] [address] Fate [phone]  [facility] (under & uninsured) [address] [place] [phone]    [email or web address]    [place] [address]  [place]    [phone]  [facility] [place]     Phone:  [phone]     [place]  [place] [address]      [place], [place] ext [place]: [place], [place], [place]   [place] ([place] therapist) [email or web address]  [place] [address]   [place] [address]    [phone]    [facility]   [address],  [place], [place]   [place]  [place]  [facility]  [address] [place], [place]        [phone]  Open Access/Walk In Clinic under & uninsured  [facility]  [address] [place], [place]  Line [phone] Crisis [phone]  [facility] [place],  ([place])   [place], [place]: [phone]) 8:30 - 12; 1 - 2:30  [facility] the [place],  [place], [place]    ([phone]):8:30 - 12; 2 - 3PM  [facility] [employer],  [address],  [place]; [phone]):   Mon - Fri 8 AM - 5 PM  [facility] [place]  MWF 12:30 to 3:00 or call to schedule an appointment   [phone]  Specific Provider options Psychology Today  [email or web address] click on find a therapist  enter your zip code left side and select or tailor a therapist for your specific need.   [facility] Provider Directory [email or web address]  (Medicaid)   Follow all drop down to find a provider  [facility] or [email or web address] [address], [place], [place] Recovery support and educational   24- Hour Availability:   [facility]  [address] [place], [place] Crisis [phone]  [facility] [employer] [phone]  [place]  [phone]   [place]  [phone] (after hours)  Therapeutic Alternative/Mobile Crisis   [phone]  [place] National Suicide Hotline  [phone] Diamantina Monks)  Call 911 or go to emergency room  [facility]  [phone]);  [place] and [employer]  [phone]); [place], [place], [place], [place], [place], [place], [place]    Dr. Holley Bouche, MD [place]

## 2022-10-22 NOTE — Assessment & Plan Note (Signed)
Patient hasn't followed w/ an endocrine, reports compliance w/ Methimazole, but also notes she is trying to conceive. Patient will need to be switched off of medication and managed for her desired pregnancy.  -Ambulatory referral to OBGYN for management of thyroid disorder w/ pregnancy.  -Ambulatory referral to Endocrine for management of thyroid disorder -TSH

## 2022-10-26 ENCOUNTER — Other Ambulatory Visit: Payer: Self-pay | Admitting: Student

## 2022-10-26 DIAGNOSIS — R7612 Nonspecific reaction to cell mediated immunity measurement of gamma interferon antigen response without active tuberculosis: Secondary | ICD-10-CM

## 2022-10-26 LAB — QUANTIFERON-TB GOLD PLUS
QuantiFERON Mitogen Value: 4.07 IU/mL
QuantiFERON Nil Value: 0.13 IU/mL
QuantiFERON TB1 Ag Value: 0.78 IU/mL
QuantiFERON TB2 Ag Value: 0.72 IU/mL
QuantiFERON-TB Gold Plus: POSITIVE — AB

## 2022-10-26 LAB — LIPID PANEL
Chol/HDL Ratio: 3.1 ratio (ref 0.0–4.4)
Cholesterol, Total: 148 mg/dL (ref 100–199)
HDL: 47 mg/dL (ref >39)
LDL Chol Calc (NIH): 89 mg/dL (ref 0–99)
Triglycerides: 57 mg/dL (ref 0–149)
VLDL Cholesterol Cal: 12 mg/dL (ref 5–40)

## 2022-10-26 LAB — TSH RFX ON ABNORMAL TO FREE T4: TSH: 2.36 u[IU]/mL (ref 0.450–4.500)

## 2022-10-26 LAB — HCV INTERPRETATION

## 2022-10-26 LAB — HCV AB W REFLEX TO QUANT PCR: HCV Ab: NONREACTIVE

## 2022-10-26 LAB — HEPATITIS B SURFACE ANTIBODY, QUANTITATIVE: Hepatitis B Surf Ab Quant: 1719 m[IU]/mL (ref >9.9)

## 2022-10-26 NOTE — Progress Notes (Signed)
Patient w/ positive Qaunt Gold, will obtain CXR

## 2022-10-30 ENCOUNTER — Ambulatory Visit
Admission: RE | Admit: 2022-10-30 | Discharge: 2022-10-30 | Disposition: A | Discharge status: Home / Self Care | Payer: Medicaid Other | Source: Ambulatory Visit | Attending: Family Medicine | Admitting: Family Medicine

## 2022-10-30 DIAGNOSIS — R7612 Nonspecific reaction to cell mediated immunity measurement of gamma interferon antigen response without active tuberculosis: Secondary | ICD-10-CM

## 2022-11-02 ENCOUNTER — Other Ambulatory Visit: Payer: Self-pay | Admitting: *Deleted

## 2022-11-02 ENCOUNTER — Inpatient Hospital Stay: Payer: Medicaid Other

## 2022-11-02 ENCOUNTER — Inpatient Hospital Stay: Payer: Medicaid Other | Attending: Hematology and Oncology | Admitting: Hematology and Oncology

## 2022-11-02 ENCOUNTER — Other Ambulatory Visit: Payer: Self-pay

## 2022-11-02 VITALS — BP 111/87 | HR 85 | Temp 98.4°F | Resp 16 | Wt 191.5 lb

## 2022-11-02 DIAGNOSIS — D500 Iron deficiency anemia secondary to blood loss (chronic): Secondary | ICD-10-CM | POA: Insufficient documentation

## 2022-11-02 DIAGNOSIS — N920 Excessive and frequent menstruation with regular cycle: Secondary | ICD-10-CM | POA: Diagnosis present

## 2022-11-02 DIAGNOSIS — Z79899 Other long term (current) drug therapy: Secondary | ICD-10-CM | POA: Diagnosis not present

## 2022-11-02 DIAGNOSIS — D75839 Thrombocytosis, unspecified: Secondary | ICD-10-CM | POA: Diagnosis not present

## 2022-11-02 DIAGNOSIS — K5900 Constipation, unspecified: Secondary | ICD-10-CM | POA: Diagnosis not present

## 2022-11-02 DIAGNOSIS — E538 Deficiency of other specified B group vitamins: Secondary | ICD-10-CM | POA: Diagnosis present

## 2022-11-02 DIAGNOSIS — F17210 Nicotine dependence, cigarettes, uncomplicated: Secondary | ICD-10-CM | POA: Insufficient documentation

## 2022-11-02 LAB — CBC WITH DIFFERENTIAL (CANCER CENTER ONLY)
Abs Immature Granulocytes: 0.02 10*3/uL (ref 0.00–0.07)
Basophils Absolute: 0.1 10*3/uL (ref 0.0–0.1)
Basophils Relative: 1 %
Eosinophils Absolute: 0.1 10*3/uL (ref 0.0–0.5)
Eosinophils Relative: 1 %
HCT: 40.9 % (ref 36.0–46.0)
Hemoglobin: 14 g/dL (ref 12.0–15.0)
Immature Granulocytes: 0 %
Lymphocytes Relative: 22 %
Lymphs Abs: 1.8 10*3/uL (ref 0.7–4.0)
MCH: 29.9 pg (ref 26.0–34.0)
MCHC: 34.2 g/dL (ref 30.0–36.0)
MCV: 87.4 fL (ref 80.0–100.0)
Monocytes Absolute: 0.5 10*3/uL (ref 0.1–1.0)
Monocytes Relative: 6 %
Neutro Abs: 5.7 10*3/uL (ref 1.7–7.7)
Neutrophils Relative %: 70 %
Platelet Count: 395 10*3/uL (ref 150–400)
RBC: 4.68 MIL/uL (ref 3.87–5.11)
RDW: 13.8 % (ref 11.5–15.5)
WBC Count: 8.1 10*3/uL (ref 4.0–10.5)
nRBC: 0 % (ref 0.0–0.2)

## 2022-11-02 LAB — FOLATE: Folate: 16.9 ng/mL (ref >5.9)

## 2022-11-02 LAB — CMP (CANCER CENTER ONLY)
ALT: 9 U/L (ref 0–44)
AST: 11 U/L — ABNORMAL LOW (ref 15–41)
Albumin: 4.5 g/dL (ref 3.5–5.0)
Alkaline Phosphatase: 62 U/L (ref 38–126)
Anion gap: 3 — ABNORMAL LOW (ref 5–15)
BUN: 5 mg/dL — ABNORMAL LOW (ref 6–20)
CO2: 26 mmol/L (ref 22–32)
Calcium: 9.2 mg/dL (ref 8.9–10.3)
Chloride: 108 mmol/L (ref 98–111)
Creatinine: 0.87 mg/dL (ref 0.44–1.00)
GFR, Estimated: >60 mL/min (ref >60)
Glucose, Bld: 83 mg/dL (ref 70–99)
Potassium: 4.2 mmol/L (ref 3.5–5.1)
Sodium: 137 mmol/L (ref 135–145)
Total Bilirubin: 0.3 mg/dL (ref 0.3–1.2)
Total Protein: 7.7 g/dL (ref 6.5–8.1)

## 2022-11-02 LAB — IRON AND IRON BINDING CAPACITY (CC-WL,HP ONLY)
Iron: 79 ug/dL (ref 28–170)
Saturation Ratios: 24 % (ref 10.4–31.8)
TIBC: 329 ug/dL (ref 250–450)
UIBC: 250 ug/dL (ref 148–442)

## 2022-11-02 LAB — FERRITIN: Ferritin: 67 ng/mL (ref 11–307)

## 2022-11-02 LAB — VITAMIN B12: Vitamin B-12: 250 pg/mL (ref 180–914)

## 2022-11-02 MED ORDER — CYANOCOBALAMIN 1000 MCG/ML IJ SOLN
1000.0000 ug | Freq: Once | INTRAMUSCULAR | Status: AC
  Administered 2022-11-02: 1000 ug via INTRAMUSCULAR
  Filled 2022-11-02: qty 1

## 2022-11-02 MED ORDER — FERROUS SULFATE 325 (65 FE) MG PO TABS: 325.0000 mg | ORAL_TABLET | Freq: Every day | ORAL | 3 refills | Status: DC

## 2022-11-02 NOTE — Progress Notes (Signed)
[facility] Telephone:([phone]) [phone]   [phone]  PROGRESS NOTE  Patient Care Team: Bess Kinds, MD as PCP - General (Family Medicine)  Hematological/Oncological History # Thrombocytosis # Leukocytosis #Iron Deficiency Anemia 2/2 to GYN Bleeding 1) 11/26/2010: WBC 16.3, Hgb 15.0, MCV 88.5, Plt 207. First CBC on record 2) 10/13/2015: WBC 8.7, Hgb 12.2, MCV 88.8, Plt 442 3) 07/11/2019: WBC 15.5, Hgb 13.7, MCV 91.7, Plt 842 4) 08/18/2019: WBC 12.5, Hgb 13.5, MCV 90.5, Plt 842 5) 09/17/2019: WBC 10.3, Hgb 11.4, MCV 89, Plt 656 6) 12/03/2019: establish care with Dr. Leonides Schanz  7) 03/10/2020: WBC 10.1, Hgb 11.4, MCV 88.3, Plt 505 8) 06/13/2020: WBC 9.8, Hgb 13.5, MCV 95.2, Plt 427 9) 09/28/2020: WBC 11.0, Hgb 13.2, MCV 95.9, Plt 374 10) 03/31/2021: WBC 9.0, Hgb 11.7, MCV 107.7, Plt 530 11) 06/30/2021: WBC 11.8, Hgb 9.9, MCV 105.3, Plt 616 12) 10/27/2021: WBC 11.6, Hgb 10.5, MCV 97.8, Plt 243, Iron 283, saturation 85%, ferritin, 31, vitamin B12 138, folate 4.7 13) 03/21/2022: WBC 9.2, Hgb 12.2, MCV 90.0, Plt 406, Iron 85, saturation 23%, Ferritin 114, vitamin B12 191, folate 30.0   Interval History:  Ellen Stone 37 y.o. female with medical history significant for iron deficiency anemia presents for a follow up visit. The patient's last visit was on 03/21/2022. In the interim since the last visit she is had no major changes in her health.  On exam today Ellen Stone reports she has been well overall in the interim since her last visit.  She continues taking her vitamin B12 shots monthly.  It does not cause her any irritation, redness, or rash.  Her energy is excellent and she currently reports is an 8 out of 10.  Her appetite is been strong.  She continues take her iron pills but does cause some constipation.  To help with that she eats more vegetables and some occasional laxatives.  She denies any lightheadedness, dizziness, or shortness of breath.. She denies fevers, chills, night  sweats, shortness of breath, chest pain or cough. She has no other complaints. Rest of the 10 point ROS is below.   MEDICAL HISTORY:  Past Medical History:  Diagnosis Date   Chronic bronchitis    Cocaine abuse    History of trichomoniasis 01/27/2020   Hyperphosphatemia 10/20/2015   Hypertension    Low TSH level 09/29/2019   Recurrent UTI (urinary tract infection)    "none lately; since I started drinking more water" (10/18/2015)   Transaminitis 01/26/2021   Vitamin D deficiency 10/20/2015    SURGICAL HISTORY: Past Surgical History:  Procedure Laterality Date   FRACTURE SURGERY     ORIF PROXIMAL TIBIAL PLATEAU FRACTURE Left 10/18/2015   ORIF TIBIA PLATEAU Left 10/18/2015   Procedure: OPEN REDUCTION INTERNAL FIXATION (ORIF) LEFT TIBIAL PLATEAU;  Surgeon: Myrene Galas, MD;  Location: [facility] OR;  Service: Orthopedics;  Laterality: Left;    SOCIAL HISTORY: Social History   Socioeconomic History   Marital status: Married    Spouse name: Not on file   Number of children: Not on file   Years of education: Not on file   Highest education level: Not on file  Occupational History   Not on file  Tobacco Use   Smoking status: Every Day    Packs/day: 0.50    Years: 10.00    Additional pack years: 0.00    Total pack years: 5.00    Types: Cigarettes    Passive exposure: Current   Smokeless tobacco: Never  Vaping  Use   Vaping Use: Never used  Substance and Sexual Activity   Alcohol use: Yes    Comment: 10/18/2015 "down to maybe 1 beer q 2 weeks or so"   Drug use: Yes    Types: Marijuana    Comment: 10/18/2015 "quit cocaine recently"   Sexual activity: Yes    Birth control/protection: Implant  Other Topics Concern   Not on file  Social History Narrative   Not on file   Social Determinants of Health   Financial Resource Strain: Not on file  Food Insecurity: Not on file  Transportation Needs: Not on file  Physical Activity: Not on file  Stress: Not on file  Social Connections: Not on  file  Intimate Partner Violence: Not on file    FAMILY HISTORY: Family History  Problem Relation Age of Onset   Clotting disorder Mother    Heart Problems Father    Clotting disorder Maternal Uncle     ALLERGIES:  is allergic to amoxicillin.  MEDICATIONS:  Current Outpatient Medications  Medication Sig Dispense Refill   ferrous sulfate 325 (65 FE) MG tablet Take 1 tablet (325 mg total) by mouth daily with breakfast. Please take with a source of Vitamin C 90 tablet 3   cetirizine (ZYRTEC) 10 MG tablet TAKE 1 TABLET(10 MG) BY MOUTH DAILY 90 tablet 0   fluticasone (FLONASE) 50 MCG/ACT nasal spray SHAKE LIQUID AND USE 2 SPRAYS IN EACH NOSTRIL DAILY 48 g 0   folic acid (FOLVITE) 1 MG tablet TAKE 1 TABLET(1 MG) BY MOUTH DAILY 30 tablet 3   ibuprofen (ADVIL) 200 MG tablet Take 200 mg by mouth every 6 (six) hours as needed.     methimazole (TAPAZOLE) 5 MG tablet Take 1 tablet (5 mg total) by mouth daily. 30 tablet 0   ondansetron (ZOFRAN-ODT) 4 MG disintegrating tablet Take 1 tablet (4 mg total) by mouth every 8 (eight) hours as needed. 20 tablet 0   No current facility-administered medications for this visit.    REVIEW OF SYSTEMS:   Constitutional: ( - ) fevers, ( - )  chills , ( - ) night sweats Eyes: ( - ) blurriness of vision, ( - ) double vision, ( - ) watery eyes Ears, nose, mouth, throat, and face: ( - ) mucositis, ( - ) sore throat Respiratory: ( - ) cough, ( - ) dyspnea, ( - ) wheezes Cardiovascular: ( - ) palpitation, ( - ) chest discomfort, ( - ) lower extremity swelling Gastrointestinal:  ( - ) nausea, ( - ) heartburn, ( - ) change in bowel habits Skin: ( - ) abnormal skin rashes Lymphatics: ( - ) new lymphadenopathy, ( - ) easy bruising Neurological: ( - ) numbness, ( - ) tingling, ( - ) new weaknesses Behavioral/Psych: ( - ) mood change, ( - ) new changes  All other systems were reviewed with the patient and are negative.  PHYSICAL EXAMINATION: ECOG PERFORMANCE STATUS:  0 - Asymptomatic  Vitals:   11/02/22 1150  BP: 111/87  Pulse: 85  Resp: 16  Temp: 98.4 F (36.9 C)  SpO2: 100%     Filed Weights   11/02/22 1150  Weight: 191 lb 8 oz (86.9 kg)      GENERAL: well appearing young [place] American female. alert, no distress and comfortable SKIN: skin color, texture, turgor are normal, no rashes or significant lesions EYES: conjunctiva are pink and non-injected, sclera clear LUNGS: clear to auscultation and percussion with normal breathing effort HEART: regular  rate & rhythm and no murmurs and no lower extremity edema Musculoskeletal: no cyanosis of digits and no clubbing  PSYCH: alert & oriented x 3, fluent speech NEURO: no focal motor/sensory deficits  LABORATORY DATA:  I have reviewed the data as listed    Latest Ref Rng & Units 11/02/2022   11:33 AM 03/21/2022   12:45 PM 10/27/2021    1:24 PM  CBC  WBC 4.0 - 10.5 K/uL 8.1  9.2  11.6   Hemoglobin 12.0 - 15.0 g/dL 16.1  09.6  04.5   Hematocrit 36.0 - 46.0 % 40.9  37.6  31.6   Platelets 150 - 400 K/uL 395  406  243        Latest Ref Rng & Units 11/02/2022   11:33 AM 03/21/2022   12:45 PM 12/05/2021    3:07 PM  CMP  Glucose 70 - 99 mg/dL 83  82    BUN 6 - 20 mg/dL 5  13    Creatinine 4.09 - 1.00 mg/dL 8.11  9.14    Sodium 782 - 145 mmol/L 137  137    Potassium 3.5 - 5.1 mmol/L 4.2  4.8  4.6   Chloride 98 - 111 mmol/L 108  110    CO2 22 - 32 mmol/L 26  19    Calcium 8.9 - 10.3 mg/dL 9.2  8.9    Total Protein 6.5 - 8.1 g/dL 7.7  7.1    Total Bilirubin 0.3 - 1.2 mg/dL 0.3  0.2    Alkaline Phos 38 - 126 U/L 62  62    AST 15 - 41 U/L 11  10    ALT 0 - 44 U/L 9  7      No results found for: "MPROTEIN" No results found for: "KPAFRELGTCHN", "LAMBDASER", "KAPLAMBRATIO"   RADIOGRAPHIC STUDIES: No results found.  ASSESSMENT & PLAN Ellen Stone 37 y.o. female with medical history significant for iron deficiency anemia presents for a follow up visit.   #Iron Deficiency Anemia 2/2 to  GYN Blood loss --Labs today show anemia with Hgb 14.0, white blood cell count 8.1, MCV 87.4, and platelets of 395 --continue PO ferrous sulfate  PO daily with a source of vitamin C.  --patient established with OB/GYN and has nexplanon to control menstrual bleeding but no longer helping. Advised to follow up with OB/GYN.  --No indication for IV iron at this time.   #Vitamin B12/Folate deficiency: --Labs today show vitamin B12 level is 191, folate level is 30.0 --currently on folic acid 1 mg once daily --On 11/03/21, received B12 injection weekly x 4 then transitioned to monthly injections. Most recent injection on 01/19/2022. Recommend to continue with monthly injections.    # Thrombocytosis, chronic.  # Leukocytosis, Resolved --leukocytosis resolved, etiology unclear. MPN workup with BCR/ABL FISH and JAK2 w/ reflex was negative.    Follow up: --Continue with monthly b12 injections x 6 --RTC in 6 months with labs  No orders of the defined types were placed in this encounter.    All questions were answered. The patient knows to call the clinic with any problems, questions or concerns.  I have spent a total of 25 minutes minutes of face-to-face and non-face-to-face time, preparing to see the patient, performing a medically appropriate examination, counseling and educating the patient, ordering medications/tests, documenting clinical information in the electronic health record, and care coordination.   Ulysees Barns, MD Department of Hematology/Oncology [facility] Phone: [phone] Pager:  [phone] Email: [email or web address]    11/02/2022 5:02 PM

## 2022-11-06 ENCOUNTER — Telehealth: Payer: Self-pay

## 2022-11-06 NOTE — Telephone Encounter (Signed)
Patient calls nurse line requesting to speak with provider regarding recent Chest X-ray. She received recent TB blood work that was positive.   She is requesting returned call to discuss next steps.   Please return call to patient at [phone].  Veronda Prude, RN

## 2022-11-07 LAB — METHYLMALONIC ACID, SERUM: Methylmalonic Acid, Quantitative: 137 nmol/L (ref 0–378)

## 2022-11-08 NOTE — Telephone Encounter (Signed)
Spoke w/ patient and discussed results of Quantiferon and CXR. Patient has latent TB and will need treatment w/ Health Department. Told patient the Health Department would be reaching out to her to schedule an appointment.   Spoke with Health Department, phone number [phone], and told them about patient. Heath Department asked that patient Quantiferon, Chest X Ray (CXR), and Demographic information be faxed to health department. They will reach out to patient and schedule an appointment in the next 1-2 weeks.

## 2022-11-08 NOTE — Telephone Encounter (Signed)
Patient paperwork was faxed over to the health department.

## 2022-11-18 ENCOUNTER — Other Ambulatory Visit: Payer: Self-pay | Admitting: Student

## 2022-11-18 DIAGNOSIS — E0590 Thyrotoxicosis, unspecified without thyrotoxic crisis or storm: Secondary | ICD-10-CM

## 2022-11-20 ENCOUNTER — Other Ambulatory Visit: Payer: Self-pay | Admitting: Student

## 2022-11-20 DIAGNOSIS — E0590 Thyrotoxicosis, unspecified without thyrotoxic crisis or storm: Secondary | ICD-10-CM

## 2022-11-20 NOTE — Progress Notes (Signed)
Patient needing to establish w/ Endocrine for management of Hyperthyroidism. Patient also wanting to get pregnant and is on medication that's bad for babies in utero. Patient will need to be switched off meds/careful planning to get pregnant.

## 2022-12-03 ENCOUNTER — Inpatient Hospital Stay: Payer: Medicaid Other | Attending: Hematology and Oncology

## 2022-12-03 DIAGNOSIS — D500 Iron deficiency anemia secondary to blood loss (chronic): Secondary | ICD-10-CM | POA: Insufficient documentation

## 2022-12-03 DIAGNOSIS — E538 Deficiency of other specified B group vitamins: Secondary | ICD-10-CM | POA: Insufficient documentation

## 2022-12-03 DIAGNOSIS — N920 Excessive and frequent menstruation with regular cycle: Secondary | ICD-10-CM | POA: Insufficient documentation

## 2022-12-05 ENCOUNTER — Telehealth: Payer: Self-pay | Admitting: Hematology and Oncology

## 2022-12-07 ENCOUNTER — Other Ambulatory Visit: Payer: Self-pay

## 2022-12-07 ENCOUNTER — Inpatient Hospital Stay: Payer: Medicaid Other

## 2022-12-07 VITALS — BP 102/73 | HR 91 | Temp 99.4°F | Resp 18

## 2022-12-07 DIAGNOSIS — D500 Iron deficiency anemia secondary to blood loss (chronic): Secondary | ICD-10-CM | POA: Diagnosis present

## 2022-12-07 DIAGNOSIS — E538 Deficiency of other specified B group vitamins: Secondary | ICD-10-CM | POA: Diagnosis present

## 2022-12-07 DIAGNOSIS — N920 Excessive and frequent menstruation with regular cycle: Secondary | ICD-10-CM | POA: Diagnosis present

## 2022-12-07 MED ORDER — CYANOCOBALAMIN 1000 MCG/ML IJ SOLN
1000.0000 ug | Freq: Once | INTRAMUSCULAR | Status: AC
  Administered 2022-12-07: 1000 ug via INTRAMUSCULAR
  Filled 2022-12-07: qty 1

## 2022-12-07 NOTE — Patient Instructions (Signed)
Vitamin B12 Deficiency Vitamin B12 deficiency means that your body does not have enough vitamin B12. The body needs this important vitamin: To make red blood cells. To make genes (DNA). To help the nerves work. If you do not have enough vitamin B12 in your body, you can have health problems, such as not having enough red blood cells in the blood (anemia). What are the causes? Not eating enough foods that contain vitamin B12. Not being able to take in (absorb) vitamin B12 from the food that you eat. Certain diseases. A condition in which the body does not make enough of a certain protein. This results in your body not taking in enough vitamin B12. Having a surgery in which part of the stomach or small intestine is taken out. Taking medicines that make it hard for the body to take in vitamin B12. These include: Heartburn medicines. Some medicines that are used to treat diabetes. What increases the risk? Being an older adult. Eating a vegetarian or vegan diet that does not include any foods that come from animals. Not eating enough foods that contain vitamin B12 while you are pregnant. Taking certain medicines. Having alcoholism. What are the signs or symptoms? In some cases, there are no symptoms. If the condition leads to too few blood cells or nerve damage, symptoms can occur, such as: Feeling weak or tired. Not being hungry. Losing feeling (numbness) or tingling in your hands and feet. Redness and burning of the tongue. Feeling sad (depressed). Confusion or memory problems. Trouble walking. If anemia is very bad, symptoms can include: Being short of breath. Being dizzy. Having a very fast heartbeat. How is this treated? Changing the way you eat and drink, such as: Eating more foods that contain vitamin B12. Drinking little or no alcohol. Getting vitamin B12 shots. Taking vitamin B12 supplements by mouth (orally). Your doctor will tell you the dose that is best for you. Follow  these instructions at home: Eating and drinking  Eat foods that come from animals and have a lot of vitamin B12 in them. These include: Meats and poultry. This includes beef, pork, chicken, [place], and organ meats, such as liver. Seafood, such as clams, rainbow trout, salmon, tuna, and haddock. Eggs. Dairy foods such as milk, yogurt, and cheese. Eat breakfast cereals that have vitamin B12 added to them (are fortified). Check the label. The items listed above may not be a complete list of foods and beverages you can eat and drink. Contact a dietitian for more information. Alcohol use Do not drink alcohol if: Your doctor tells you not to drink. You are pregnant, may be pregnant, or are planning to become pregnant. If you drink alcohol: Limit how much you have to: 0-1 drink a day for women. 0-2 drinks a day for men. Know how much alcohol is in your drink. In the [place] one drink equals one 12 oz bottle of beer (355 mL), one 5 oz glass of wine (148 mL), or one 1 oz glass of hard liquor (44 mL). General instructions Get any vitamin B12 shots if told by your doctor. Take supplements only as told by your doctor. Follow the directions. Keep all follow-up visits. Contact a doctor if: Your symptoms come back. Your symptoms get worse or do not get better with treatment. Get help right away if: You have trouble breathing. You have a very fast heartbeat. You have chest pain. You get dizzy. You faint. These symptoms may be an emergency. Get help right away. Call 911.  Do not wait to see if the symptoms will go away. Do not drive yourself to the hospital. Summary Vitamin B12 deficiency means that your body is not getting enough of the vitamin. In some cases, there are no symptoms of this condition. Treatment may include making a change in the way you eat and drink, getting shots, or taking supplements. Eat foods that have vitamin B12 in them. This information is not intended to replace advice  given to you by your health care provider. Make sure you discuss any questions you have with your health care provider. Document Revised: 02/24/2021 Document Reviewed: 02/24/2021 Elsevier Patient Education  2024 [employer].

## 2022-12-31 ENCOUNTER — Inpatient Hospital Stay: Payer: Medicaid Other | Attending: Hematology and Oncology

## 2022-12-31 ENCOUNTER — Other Ambulatory Visit: Payer: Self-pay

## 2022-12-31 DIAGNOSIS — D500 Iron deficiency anemia secondary to blood loss (chronic): Secondary | ICD-10-CM | POA: Diagnosis present

## 2022-12-31 DIAGNOSIS — N920 Excessive and frequent menstruation with regular cycle: Secondary | ICD-10-CM | POA: Diagnosis present

## 2022-12-31 DIAGNOSIS — E538 Deficiency of other specified B group vitamins: Secondary | ICD-10-CM | POA: Insufficient documentation

## 2022-12-31 MED ORDER — CYANOCOBALAMIN 1000 MCG/ML IJ SOLN
1000.0000 ug | Freq: Once | INTRAMUSCULAR | Status: AC
  Administered 2022-12-31: 1000 ug via INTRAMUSCULAR
  Filled 2022-12-31: qty 1

## 2022-12-31 NOTE — Progress Notes (Signed)
Pt. Here for Vitamin B 12 injection.  Goes to [facility] for Iron Infusion.  Wanted to know If she could have Infusions scheduled here?  States was told we didn't do them here.  She was advised we do.  Secured chatted Dr. Leonides Schanz and Beth/RN.  States ok to have scheduled here.  Called and informed patient

## 2023-01-09 ENCOUNTER — Other Ambulatory Visit: Payer: Self-pay

## 2023-01-09 DIAGNOSIS — E0590 Thyrotoxicosis, unspecified without thyrotoxic crisis or storm: Secondary | ICD-10-CM

## 2023-01-11 ENCOUNTER — Encounter: Payer: Self-pay | Admitting: Physician Assistant

## 2023-01-11 ENCOUNTER — Other Ambulatory Visit (INDEPENDENT_AMBULATORY_CARE_PROVIDER_SITE_OTHER): Payer: Medicaid Other

## 2023-01-11 DIAGNOSIS — E0590 Thyrotoxicosis, unspecified without thyrotoxic crisis or storm: Secondary | ICD-10-CM

## 2023-01-11 LAB — TSH: TSH: 1.52 u[IU]/mL (ref 0.35–5.50)

## 2023-01-11 LAB — T4, FREE: Free T4: 0.57 ng/dL — ABNORMAL LOW (ref 0.60–1.60)

## 2023-01-11 LAB — T3, FREE: T3, Free: 2.2 pg/mL — ABNORMAL LOW (ref 2.3–4.2)

## 2023-01-15 LAB — THYROID STIMULATING IMMUNOGLOBULIN: TSI: <89 % baseline (ref <140)

## 2023-01-15 LAB — TRAB (TSH RECEPTOR BINDING ANTIBODY): TRAB: <1 IU/L (ref <=2.00)

## 2023-01-18 ENCOUNTER — Encounter: Payer: Self-pay | Admitting: "Endocrinology

## 2023-01-18 ENCOUNTER — Ambulatory Visit (INDEPENDENT_AMBULATORY_CARE_PROVIDER_SITE_OTHER): Payer: Medicaid Other | Admitting: "Endocrinology

## 2023-01-18 VITALS — BP 100/98 | HR 90 | Ht 71.0 in | Wt 199.8 lb

## 2023-01-18 DIAGNOSIS — E0590 Thyrotoxicosis, unspecified without thyrotoxic crisis or storm: Secondary | ICD-10-CM

## 2023-01-18 NOTE — Progress Notes (Signed)
Outpatient Endocrinology Note [place], MD  01/18/23   MIKENZI SHAWVER [birth year] [id number]  Referring Provider: Westley Chandler, MD Primary Care Provider: Bess Kinds, MD Subjective  Chief Complaint  Patient presents with   Hyperthyroidism    Assessment & Plan  Ellen Stone was seen today for hyperthyroidism.  Diagnoses and all orders for this visit:  Hyperthyroidism -     T3, free; Future -     T4, free; Future -     TSH; Future    Ellen Stone is currently taking methimazole 5 mg qd. Patient currently clinically euthyroid. TSH WNL with free T4 and Free T3 almost normal.  Educated on thyroid axis.  Discussed the etiology for hyperthyroidism. Educated on thyroid axis.  Recommend the following: Continue methimazole 5 mg once a day and repeat labs in 6 weeks. Repeat labs sooner if symptoms of hyper or hypothyroidism develop.  Counseled on: -side effects of Methimazole including but not limited to allergic reaction, rash, bone marrow suppression, liver dysfunction and teratogenic potential -implications in pregnancy and breastfeeding -compliance and follow up needs    If you notice any symptoms of worsening fatigue, fever with sore throat, loss of appetite, yellowing of eyes, dark urine, joint pains, sores in the mouth, itchy rash, light colored stools or abdominal pain, or decide pregnancy/breastfeeding, please stop the medication and call us immediately as this can be a serious side effect of the medication.   I have reviewed current medications, nurse's notes, allergies, vital signs, past medical and surgical history, family medical history, and social history for this encounter. Counseled patient on symptoms, examination findings, lab findings, imaging results, treatment decisions and monitoring and prognosis. The patient understood the recommendations and agrees with the treatment plan. All questions regarding treatment plan were fully answered.   Return in  about 7 weeks (around 03/07/2023) for visit + labs before next visit.   Ellen [place], MD  01/18/23   I have reviewed current medications, nurse's notes, allergies, vital signs, past medical and surgical history, family medical history, and social history for this encounter. Counseled patient on symptoms, examination findings, lab findings, imaging results, treatment decisions and monitoring and prognosis. The patient understood the recommendations and agrees with the treatment plan. All questions regarding treatment plan were fully answered.   History of Present Illness Ellen Stone is a 37 y.o. year old female who presents to our clinic with hyperthyroidism.  Was on crack cocaine that led to hospitalization leading to thyroid work up.  Started methimazole since 2022.  Has been on methimazole 5 mg every day since 02/2022.   Symptoms suggestive of HYPOTHYROIDISM:  fatigue No weight gain No cold intolerance  No constipation  Yes, on iron and b12  Symptoms suggestive of HYPERTHYROIDISM:  weight loss  No heat intolerance No hyperdefecation  No palpitations  No  Compressive symptoms:  dysphagia  No dysphonia  No positional dyspnea (especially with simultaneous arms elevation)  No  Smokes  Yes On biotin  No Personal history of head/neck surgery/irradiation  No  Adverse Drug Effects from Methimazole (MMI): rash No fever No throat pain No arthritis No mouth ulcers No jaundice No loss of appetite No lymphadenopathy No  Physical Exam  BP (!) 100/98   Pulse 90   Ht 5\' 11"  (1.803 m)   Wt 199 lb 12.8 oz (90.6 kg)   SpO2 98%   BMI 27.87 kg/m  Constitutional: well developed, well nourished Head: normocephalic, atraumatic, no exophthalmos Eyes: sclera  anicteric, no redness Neck: no thyromegaly, no thyroid tenderness; no nodules palpated Lungs: normal respiratory effort Neurology: alert and oriented, no fine hand tremor Skin: dry, no appreciable rashes Musculoskeletal:  no appreciable defects Psychiatric: normal mood and affect  Allergies Allergies  Allergen Reactions   Amoxicillin Nausea And Vomiting    N/v, dizziness    Current Medications Patient's Medications  New Prescriptions   No medications on file  Previous Medications   CETIRIZINE (ZYRTEC) 10 MG TABLET    TAKE 1 TABLET(10 MG) BY MOUTH DAILY   FERROUS SULFATE 325 (65 FE) MG TABLET    Take 1 tablet (325 mg total) by mouth daily with breakfast. Please take with a source of Vitamin C   FLUTICASONE (FLONASE) 50 MCG/ACT NASAL SPRAY    SHAKE LIQUID AND USE 2 SPRAYS IN EACH NOSTRIL DAILY   FOLIC ACID (FOLVITE) 1 MG TABLET    TAKE 1 TABLET(1 MG) BY MOUTH DAILY   IBUPROFEN (ADVIL) 200 MG TABLET    Take 200 mg by mouth every 6 (six) hours as needed.   METHIMAZOLE (TAPAZOLE) 5 MG TABLET    TAKE 1 TABLET(5 MG) BY MOUTH DAILY   ONDANSETRON (ZOFRAN-ODT) 4 MG DISINTEGRATING TABLET    Take 1 tablet (4 mg total) by mouth every 8 (eight) hours as needed.  Modified Medications   No medications on file  Discontinued Medications   No medications on file    Past Medical History Past Medical History:  Diagnosis Date   Chronic bronchitis (HCC)    Cocaine abuse (HCC)    History of trichomoniasis 01/27/2020   Hyperphosphatemia 10/20/2015   Hypertension    Low TSH level 09/29/2019   Recurrent UTI (urinary tract infection)    "none lately; since I started drinking more water" (10/18/2015)   Transaminitis 01/26/2021   Vitamin D deficiency 10/20/2015    Past Surgical History Past Surgical History:  Procedure Laterality Date   FRACTURE SURGERY     ORIF PROXIMAL TIBIAL PLATEAU FRACTURE Left 10/18/2015   ORIF TIBIA PLATEAU Left 10/18/2015   Procedure: OPEN REDUCTION INTERNAL FIXATION (ORIF) LEFT TIBIAL PLATEAU;  Surgeon: Myrene Galas, MD;  Location: [facility] OR;  Service: Orthopedics;  Laterality: Left;    Family History family history includes Clotting disorder in her maternal uncle and mother; Heart Problems in her  father.  Social History Social History   Socioeconomic History   Marital status: Married    Spouse name: Not on file   Number of children: Not on file   Years of education: Not on file   Highest education level: Not on file  Occupational History   Not on file  Tobacco Use   Smoking status: Every Day    Packs/day: 0.50    Years: 10.00    Additional pack years: 0.00    Total pack years: 5.00    Types: Cigarettes    Passive exposure: Current   Smokeless tobacco: Never  Vaping Use   Vaping Use: Never used  Substance and Sexual Activity   Alcohol use: Yes    Comment: 10/18/2015 "down to maybe 1 beer q 2 weeks or so"   Drug use: Yes    Types: Marijuana    Comment: 10/18/2015 "quit cocaine recently"   Sexual activity: Yes    Birth control/protection: Implant  Other Topics Concern   Not on file  Social History Narrative   Not on file   Social Determinants of Health   Financial Resource Strain: Not on file  Food Insecurity:  Not on file  Transportation Needs: Not on file  Physical Activity: Not on file  Stress: Not on file  Social Connections: Not on file  Intimate Partner Violence: Not on file    Laboratory Investigations Lab Results  Component Value Date   TSH 1.52 01/11/2023   TSH 2.360 10/18/2022   TSH 2.420 12/05/2021   FREET4 0.57 (L) 01/11/2023   FREET4 0.79 (L) 12/05/2021     Lab Results  Component Value Date   TSI <89 01/11/2023     No components found for: "TRAB"   Lab Results  Component Value Date   CHOL 148 10/18/2022   Lab Results  Component Value Date   HDL 47 10/18/2022   Lab Results  Component Value Date   LDLCALC 89 10/18/2022   Lab Results  Component Value Date   TRIG 57 10/18/2022   Lab Results  Component Value Date   CHOLHDL 3.1 10/18/2022   Lab Results  Component Value Date   CREATININE 0.87 11/02/2022   No results found for: "GFR"    Component Value Date/Time   NA 137 11/02/2022 1133   NA 137 09/17/2019 1606   K 4.2  11/02/2022 1133   CL 108 11/02/2022 1133   CO2 26 11/02/2022 1133   GLUCOSE 83 11/02/2022 1133   BUN 5 (L) 11/02/2022 1133   BUN 8 09/17/2019 1606   CREATININE 0.87 11/02/2022 1133   CALCIUM 9.2 11/02/2022 1133   CALCIUM 9.2 10/20/2015 0444   PROT 7.7 11/02/2022 1133   ALBUMIN 4.5 11/02/2022 1133   AST 11 (L) 11/02/2022 1133   ALT 9 11/02/2022 1133   ALKPHOS 62 11/02/2022 1133   BILITOT 0.3 11/02/2022 1133   GFRNONAA >60 11/02/2022 1133   GFRAA >60 03/10/2020 1103      Latest Ref Rng & Units 11/02/2022   11:33 AM 03/21/2022   12:45 PM 12/05/2021    3:07 PM  BMP  Glucose 70 - 99 mg/dL 83  82    BUN 6 - 20 mg/dL 5  13    Creatinine 1.61 - 1.00 mg/dL 0.96  0.45    Sodium 409 - 145 mmol/L 137  137    Potassium 3.5 - 5.1 mmol/L 4.2  4.8  4.6   Chloride 98 - 111 mmol/L 108  110    CO2 22 - 32 mmol/L 26  19    Calcium 8.9 - 10.3 mg/dL 9.2  8.9         Component Value Date/Time   WBC 8.1 11/02/2022 1133   WBC 16.4 (H) 06/17/2021 1416   RBC 4.68 11/02/2022 1133   HGB 14.0 11/02/2022 1133   HGB 12.3 08/24/2021 1605   HCT 40.9 11/02/2022 1133   HCT 36.2 08/24/2021 1605   PLT 395 11/02/2022 1133   PLT 555 (H) 08/24/2021 1605   MCV 87.4 11/02/2022 1133   MCV 104 (H) 08/24/2021 1605   MCH 29.9 11/02/2022 1133   MCHC 34.2 11/02/2022 1133   RDW 13.8 11/02/2022 1133   RDW 12.5 08/24/2021 1605   LYMPHSABS 1.8 11/02/2022 1133   LYMPHSABS 1.9 08/24/2021 1605   MONOABS 0.5 11/02/2022 1133   EOSABS 0.1 11/02/2022 1133   EOSABS 0.1 08/24/2021 1605   BASOSABS 0.1 11/02/2022 1133   BASOSABS 0.1 08/24/2021 1605      Parts of this note may have been dictated using voice recognition software. There may be variances in spelling and vocabulary which are unintentional. Not all errors are proofread. Please notify the  author if any discrepancies are noted or if the meaning of any statement is not clear.

## 2023-01-18 NOTE — Patient Instructions (Signed)
If you notice any symptoms of worsening fatigue, fever with sore throat, loss of appetite, yellowing of eyes, dark urine, joint pains, sores in the mouth, itchy rash, light colored stools or abdominal pain, or decide pregnancy/breastfeeding, please stop the medication and call us immediately as this can be a serious side effect of the medication.

## 2023-01-23 ENCOUNTER — Encounter: Payer: Self-pay | Admitting: Student

## 2023-01-23 ENCOUNTER — Ambulatory Visit (INDEPENDENT_AMBULATORY_CARE_PROVIDER_SITE_OTHER): Payer: Medicaid Other | Admitting: Student

## 2023-01-23 VITALS — BP 103/72 | HR 84 | Ht 71.0 in | Wt 200.1 lb

## 2023-01-23 DIAGNOSIS — Z3009 Encounter for other general counseling and advice on contraception: Secondary | ICD-10-CM | POA: Diagnosis not present

## 2023-01-23 LAB — POCT URINE PREGNANCY: Preg Test, Ur: NEGATIVE

## 2023-01-23 MED ORDER — MEDROXYPROGESTERONE ACETATE 150 MG/ML IM SUSY
150.0000 mg | PREFILLED_SYRINGE | Freq: Once | INTRAMUSCULAR | Status: AC
Start: 2023-01-23 — End: ?

## 2023-01-23 NOTE — Patient Instructions (Signed)
It was great to see you! Thank you for allowing me to participate in your care!  Our plans for today:  - Start Birth Control  Will start the depot injections. We will see you back every 3 months  Let me know if you need anything else.    Take care and seek immediate care sooner if you develop any concerns.   Dr. Bess Kinds, MD [facility]

## 2023-01-23 NOTE — Progress Notes (Signed)
  SUBJECTIVE:   CHIEF COMPLAINT / HPI:   Birth Control  -Was on Nexplanon, removed 05/09/22 and started depot  -Hx of AUB and was on nexplanon for it.   Today: Notes she's had 2 months of abnormal bleeding between periods, enough to where she has to wear a pad daily. Is wanting to start depot today, to prevent bleeding and for birth control. She spoke with her endocrinologist and decided against trying to have kids at this time. Also reports social issue with partner, further discouraging pregnancy.   PERTINENT  PMH / PSH:    Patient Care Team: Bess Kinds, MD as PCP - General (Family Medicine) OBJECTIVE:  BP 103/72   Pulse 84   Ht 5\' 11"  (1.803 m)   Wt 200 lb 2 oz (90.8 kg)   LMP 01/10/2023   SpO2 100%   BMI 27.91 kg/m  Physical Exam Constitutional:      Appearance: Normal appearance.  Cardiovascular:     Rate and Rhythm: Normal rate and regular rhythm.     Pulses: Normal pulses.     Heart sounds: Normal heart sounds. No murmur heard.    No friction rub. No gallop.  Pulmonary:     Effort: Pulmonary effort is normal. No respiratory distress.     Breath sounds: Normal breath sounds. No stridor. No wheezing, rhonchi or rales.  Abdominal:     General: Bowel sounds are normal. There is no distension.     Palpations: Abdomen is soft. There is no mass.     Tenderness: There is no abdominal tenderness.  Neurological:     Mental Status: She is alert.  Psychiatric:        Mood and Affect: Mood normal.        Behavior: Behavior normal.      ASSESSMENT/PLAN:  Birth control counseling Assessment & Plan: Patient comes in wanting to start birth control.  Patient originally interested in getting pregnant, but secondary to issues with partner, and discussion with endocrinologist, patient decided she would like to start birth control.  Patient request to start Depo, as she has used it in the past with good success.  Patient also went to control her periods/bleeding in between  cycles, with birth control, as this is why it was started in the past.  Patient pregnancy test negative in clinic today.  Patient was given dose of Depo. - Depo-Provera every 3 months, follow-up as needed  Orders: -     POCT urine pregnancy -     medroxyPROGESTERone Acetate   No follow-ups on file. Bess Kinds, MD 01/30/2023, 4:59 PM PGY-3, [facility]

## 2023-01-28 ENCOUNTER — Inpatient Hospital Stay: Payer: Medicaid Other | Attending: Hematology and Oncology

## 2023-01-28 ENCOUNTER — Other Ambulatory Visit: Payer: Self-pay | Admitting: Hematology and Oncology

## 2023-01-28 ENCOUNTER — Inpatient Hospital Stay: Payer: Medicaid Other

## 2023-01-28 ENCOUNTER — Other Ambulatory Visit: Payer: Self-pay

## 2023-01-28 DIAGNOSIS — N920 Excessive and frequent menstruation with regular cycle: Secondary | ICD-10-CM | POA: Insufficient documentation

## 2023-01-28 DIAGNOSIS — D500 Iron deficiency anemia secondary to blood loss (chronic): Secondary | ICD-10-CM

## 2023-01-28 DIAGNOSIS — E538 Deficiency of other specified B group vitamins: Secondary | ICD-10-CM

## 2023-01-28 DIAGNOSIS — E0590 Thyrotoxicosis, unspecified without thyrotoxic crisis or storm: Secondary | ICD-10-CM

## 2023-01-28 LAB — CBC WITH DIFFERENTIAL (CANCER CENTER ONLY)
Abs Immature Granulocytes: 0.03 10*3/uL (ref 0.00–0.07)
Basophils Absolute: 0.1 10*3/uL (ref 0.0–0.1)
Basophils Relative: 1 %
Eosinophils Absolute: 0.2 10*3/uL (ref 0.0–0.5)
Eosinophils Relative: 2 %
HCT: 35.2 % — ABNORMAL LOW (ref 36.0–46.0)
Hemoglobin: 11.8 g/dL — ABNORMAL LOW (ref 12.0–15.0)
Immature Granulocytes: 0 %
Lymphocytes Relative: 21 %
Lymphs Abs: 2 10*3/uL (ref 0.7–4.0)
MCH: 30.6 pg (ref 26.0–34.0)
MCHC: 33.5 g/dL (ref 30.0–36.0)
MCV: 91.4 fL (ref 80.0–100.0)
Monocytes Absolute: 0.6 10*3/uL (ref 0.1–1.0)
Monocytes Relative: 6 %
Neutro Abs: 6.5 10*3/uL (ref 1.7–7.7)
Neutrophils Relative %: 70 %
Platelet Count: 339 10*3/uL (ref 150–400)
RBC: 3.85 MIL/uL — ABNORMAL LOW (ref 3.87–5.11)
RDW: 14.2 % (ref 11.5–15.5)
WBC Count: 9.4 10*3/uL (ref 4.0–10.5)
nRBC: 0 % (ref 0.0–0.2)

## 2023-01-28 LAB — CMP (CANCER CENTER ONLY)
ALT: 7 U/L (ref 0–44)
AST: 12 U/L — ABNORMAL LOW (ref 15–41)
Albumin: 4.3 g/dL (ref 3.5–5.0)
Alkaline Phosphatase: 58 U/L (ref 38–126)
Anion gap: 6 (ref 5–15)
BUN: 10 mg/dL (ref 6–20)
CO2: 22 mmol/L (ref 22–32)
Calcium: 8.8 mg/dL — ABNORMAL LOW (ref 8.9–10.3)
Chloride: 109 mmol/L (ref 98–111)
Creatinine: 0.74 mg/dL (ref 0.44–1.00)
GFR, Estimated: >60 mL/min (ref >60)
Glucose, Bld: 83 mg/dL (ref 70–99)
Potassium: 3.7 mmol/L (ref 3.5–5.1)
Sodium: 137 mmol/L (ref 135–145)
Total Bilirubin: 0.2 mg/dL — ABNORMAL LOW (ref 0.3–1.2)
Total Protein: 7.4 g/dL (ref 6.5–8.1)

## 2023-01-28 LAB — IRON AND IRON BINDING CAPACITY (CC-WL,HP ONLY)
Iron: 40 ug/dL (ref 28–170)
Saturation Ratios: 11 % (ref 10.4–31.8)
TIBC: 360 ug/dL (ref 250–450)
UIBC: 320 ug/dL (ref 148–442)

## 2023-01-28 LAB — VITAMIN B12: Vitamin B-12: 224 pg/mL (ref 180–914)

## 2023-01-28 LAB — FERRITIN: Ferritin: 28 ng/mL (ref 11–307)

## 2023-01-28 MED ORDER — CYANOCOBALAMIN 1000 MCG/ML IJ SOLN
1000.0000 ug | Freq: Once | INTRAMUSCULAR | Status: AC
  Administered 2023-01-28: 1000 ug via INTRAMUSCULAR
  Filled 2023-01-28: qty 1

## 2023-01-28 NOTE — Patient Instructions (Signed)
Vitamin B12 Injection What is this medication? Vitamin B12 (VAHY tuh min B12) prevents and treats low vitamin B12 levels in your body. It is used in people who do not get enough vitamin B12 from their diet or when their digestive tract does not absorb enough. Vitamin B12 plays an important role in maintaining the health of your nervous system and red blood cells. This medicine may be used for other purposes; ask your health care provider or pharmacist if you have questions. COMMON BRAND NAME(S): B-12 Compliance Kit, B-12 Injection Kit, Cyomin, Dodex, LA-12, Nutri-Twelve, Physicians EZ Use B-12, Primabalt What should I tell my care team before I take this medication? They need to know if you have any of these conditions: Kidney disease Leber's disease Megaloblastic anemia An unusual or allergic reaction to cyanocobalamin, cobalt, other medications, foods, dyes, or preservatives Pregnant or trying to get pregnant Breast-feeding How should I use this medication? This medication is injected into a muscle or deeply under the skin. It is usually given in a clinic or care team's office. However, your care team may teach you how to inject yourself. Follow all instructions. Talk to your care team about the use of this medication in children. Special care may be needed. Overdosage: If you think you have taken too much of this medicine contact a poison control center or emergency room at once. NOTE: This medicine is only for you. Do not share this medicine with others. What if I miss a dose? If you are given your dose at a clinic or care team's office, call to reschedule your appointment. If you give your own injections, and you miss a dose, take it as soon as you can. If it is almost time for your next dose, take only that dose. Do not take double or extra doses. What may interact with this medication? Alcohol Colchicine This list may not describe all possible interactions. Give your health care  provider a list of all the medicines, herbs, non-prescription drugs, or dietary supplements you use. Also tell them if you smoke, drink alcohol, or use illegal drugs. Some items may interact with your medicine. What should I watch for while using this medication? Visit your care team regularly. You may need blood work done while you are taking this medication. You may need to follow a special diet. Talk to your care team. Limit your alcohol intake and avoid smoking to get the best benefit. What side effects may I notice from receiving this medication? Side effects that you should report to your care team as soon as possible: Allergic reactions--skin rash, itching, hives, swelling of the face, lips, tongue, or throat Swelling of the ankles, hands, or feet Trouble breathing Side effects that usually do not require medical attention (report to your care team if they continue or are bothersome): Diarrhea This list may not describe all possible side effects. Call your doctor for medical advice about side effects. You may report side effects to FDA at 1-800-FDA-1088. Where should I keep my medication? Keep out of the reach of children. Store at room temperature between 15 and 30 degrees C (59 and 85 degrees F). Protect from light. Throw away any unused medication after the expiration date. NOTE: This sheet is a summary. It may not cover all possible information. If you have questions about this medicine, talk to your doctor, pharmacist, or health care provider.  2024 Elsevier/Gold Standard (2021-03-14 00:00:00)  

## 2023-01-30 DIAGNOSIS — Z3009 Encounter for other general counseling and advice on contraception: Secondary | ICD-10-CM | POA: Insufficient documentation

## 2023-01-30 NOTE — Assessment & Plan Note (Signed)
Patient comes in wanting to start birth control.  Patient originally interested in getting pregnant, but secondary to issues with partner, and discussion with endocrinologist, patient decided she would like to start birth control.  Patient request to start Depo, as she has used it in the past with good success.  Patient also went to control her periods/bleeding in between cycles, with birth control, as this is why it was started in the past.  Patient pregnancy test negative in clinic today.  Patient was given dose of Depo. - Depo-Provera every 3 months, follow-up as needed

## 2023-01-31 ENCOUNTER — Other Ambulatory Visit: Payer: Self-pay | Admitting: Physician Assistant

## 2023-01-31 DIAGNOSIS — E538 Deficiency of other specified B group vitamins: Secondary | ICD-10-CM

## 2023-02-25 ENCOUNTER — Other Ambulatory Visit: Payer: Self-pay

## 2023-02-25 ENCOUNTER — Inpatient Hospital Stay: Payer: Medicaid Other | Attending: Hematology and Oncology

## 2023-02-25 DIAGNOSIS — N920 Excessive and frequent menstruation with regular cycle: Secondary | ICD-10-CM | POA: Diagnosis present

## 2023-02-25 DIAGNOSIS — D500 Iron deficiency anemia secondary to blood loss (chronic): Secondary | ICD-10-CM | POA: Diagnosis present

## 2023-02-25 DIAGNOSIS — E538 Deficiency of other specified B group vitamins: Secondary | ICD-10-CM | POA: Diagnosis present

## 2023-02-25 MED ORDER — CYANOCOBALAMIN 1000 MCG/ML IJ SOLN
1000.0000 ug | Freq: Once | INTRAMUSCULAR | Status: AC
  Administered 2023-02-25: 1000 ug via INTRAMUSCULAR
  Filled 2023-02-25: qty 1

## 2023-03-01 ENCOUNTER — Ambulatory Visit: Payer: Medicaid Other | Admitting: Obstetrics and Gynecology

## 2023-03-02 ENCOUNTER — Other Ambulatory Visit: Payer: Self-pay | Admitting: Student

## 2023-03-02 DIAGNOSIS — E0590 Thyrotoxicosis, unspecified without thyrotoxic crisis or storm: Secondary | ICD-10-CM

## 2023-03-25 ENCOUNTER — Inpatient Hospital Stay: Payer: Medicaid Other | Attending: Hematology and Oncology

## 2023-04-05 ENCOUNTER — Other Ambulatory Visit (INDEPENDENT_AMBULATORY_CARE_PROVIDER_SITE_OTHER): Payer: Medicaid Other

## 2023-04-05 DIAGNOSIS — E0590 Thyrotoxicosis, unspecified without thyrotoxic crisis or storm: Secondary | ICD-10-CM | POA: Diagnosis not present

## 2023-04-05 LAB — T4, FREE: Free T4: 0.71 ng/dL (ref 0.60–1.60)

## 2023-04-05 LAB — T3, FREE: T3, Free: 3 pg/mL (ref 2.3–4.2)

## 2023-04-09 ENCOUNTER — Ambulatory Visit: Payer: Medicaid Other | Admitting: "Endocrinology

## 2023-04-17 ENCOUNTER — Ambulatory Visit: Payer: Medicaid Other | Admitting: "Endocrinology

## 2023-04-21 ENCOUNTER — Other Ambulatory Visit: Payer: Self-pay | Admitting: Hematology and Oncology

## 2023-04-21 DIAGNOSIS — E538 Deficiency of other specified B group vitamins: Secondary | ICD-10-CM

## 2023-04-21 NOTE — Progress Notes (Signed)
No show

## 2023-04-22 ENCOUNTER — Inpatient Hospital Stay: Payer: Medicaid Other

## 2023-04-22 ENCOUNTER — Inpatient Hospital Stay: Payer: Medicaid Other | Attending: Hematology and Oncology | Admitting: Hematology and Oncology

## 2023-04-22 DIAGNOSIS — N920 Excessive and frequent menstruation with regular cycle: Secondary | ICD-10-CM | POA: Insufficient documentation

## 2023-04-22 DIAGNOSIS — D649 Anemia, unspecified: Secondary | ICD-10-CM

## 2023-04-22 DIAGNOSIS — E538 Deficiency of other specified B group vitamins: Secondary | ICD-10-CM | POA: Insufficient documentation

## 2023-04-22 DIAGNOSIS — D500 Iron deficiency anemia secondary to blood loss (chronic): Secondary | ICD-10-CM | POA: Insufficient documentation

## 2023-04-23 ENCOUNTER — Telehealth: Payer: Self-pay | Admitting: Hematology and Oncology

## 2023-04-25 ENCOUNTER — Other Ambulatory Visit: Payer: Self-pay | Admitting: Hematology and Oncology

## 2023-04-25 ENCOUNTER — Inpatient Hospital Stay: Payer: Medicaid Other

## 2023-04-25 DIAGNOSIS — D500 Iron deficiency anemia secondary to blood loss (chronic): Secondary | ICD-10-CM | POA: Diagnosis present

## 2023-04-25 DIAGNOSIS — E538 Deficiency of other specified B group vitamins: Secondary | ICD-10-CM | POA: Diagnosis present

## 2023-04-25 DIAGNOSIS — N920 Excessive and frequent menstruation with regular cycle: Secondary | ICD-10-CM | POA: Diagnosis present

## 2023-04-25 LAB — CMP (CANCER CENTER ONLY)
ALT: 8 U/L (ref 0–44)
AST: 12 U/L — ABNORMAL LOW (ref 15–41)
Albumin: 4.6 g/dL (ref 3.5–5.0)
Alkaline Phosphatase: 74 U/L (ref 38–126)
Anion gap: 6 (ref 5–15)
BUN: 9 mg/dL (ref 6–20)
CO2: 25 mmol/L (ref 22–32)
Calcium: 9.5 mg/dL (ref 8.9–10.3)
Chloride: 104 mmol/L (ref 98–111)
Creatinine: 0.73 mg/dL (ref 0.44–1.00)
GFR, Estimated: >60 mL/min (ref >60)
Glucose, Bld: 79 mg/dL (ref 70–99)
Potassium: 3.4 mmol/L — ABNORMAL LOW (ref 3.5–5.1)
Sodium: 135 mmol/L (ref 135–145)
Total Bilirubin: 0.3 mg/dL (ref 0.3–1.2)
Total Protein: 8.2 g/dL — ABNORMAL HIGH (ref 6.5–8.1)

## 2023-04-25 LAB — RETIC PANEL
Immature Retic Fract: 9.5 % (ref 2.3–15.9)
RBC.: 4.89 MIL/uL (ref 3.87–5.11)
Retic Count, Absolute: 77.3 10*3/uL (ref 19.0–186.0)
Retic Ct Pct: 1.6 % (ref 0.4–3.1)
Reticulocyte Hemoglobin: 32 pg (ref >27.9)

## 2023-04-25 LAB — CBC WITH DIFFERENTIAL (CANCER CENTER ONLY)
Abs Immature Granulocytes: 0.02 10*3/uL (ref 0.00–0.07)
Basophils Absolute: 0.1 10*3/uL (ref 0.0–0.1)
Basophils Relative: 1 %
Eosinophils Absolute: 0.1 10*3/uL (ref 0.0–0.5)
Eosinophils Relative: 1 %
HCT: 42.1 % (ref 36.0–46.0)
Hemoglobin: 14 g/dL (ref 12.0–15.0)
Immature Granulocytes: 0 %
Lymphocytes Relative: 24 %
Lymphs Abs: 2.1 10*3/uL (ref 0.7–4.0)
MCH: 29 pg (ref 26.0–34.0)
MCHC: 33.3 g/dL (ref 30.0–36.0)
MCV: 87.2 fL (ref 80.0–100.0)
Monocytes Absolute: 0.6 10*3/uL (ref 0.1–1.0)
Monocytes Relative: 7 %
Neutro Abs: 5.9 10*3/uL (ref 1.7–7.7)
Neutrophils Relative %: 67 %
Platelet Count: 397 10*3/uL (ref 150–400)
RBC: 4.83 MIL/uL (ref 3.87–5.11)
RDW: 13.5 % (ref 11.5–15.5)
WBC Count: 8.8 10*3/uL (ref 4.0–10.5)
nRBC: 0 % (ref 0.0–0.2)

## 2023-04-25 LAB — IRON AND IRON BINDING CAPACITY (CC-WL,HP ONLY)
Iron: 49 ug/dL (ref 28–170)
Saturation Ratios: 13 % (ref 10.4–31.8)
TIBC: 379 ug/dL (ref 250–450)
UIBC: 330 ug/dL (ref 148–442)

## 2023-04-25 LAB — VITAMIN B12: Vitamin B-12: 209 pg/mL (ref 180–914)

## 2023-04-25 LAB — FOLATE: Folate: 16.6 ng/mL (ref >5.9)

## 2023-04-25 MED ORDER — CYANOCOBALAMIN 1000 MCG/ML IJ SOLN
1000.0000 ug | Freq: Once | INTRAMUSCULAR | Status: AC
  Administered 2023-04-25: 1000 ug via INTRAMUSCULAR
  Filled 2023-04-25: qty 1

## 2023-04-26 LAB — FERRITIN: Ferritin: 35 ng/mL (ref 11–307)

## 2023-04-28 LAB — METHYLMALONIC ACID, SERUM: Methylmalonic Acid, Quantitative: 123 nmol/L (ref 0–378)

## 2023-04-29 ENCOUNTER — Encounter: Payer: Self-pay | Admitting: Hematology and Oncology

## 2023-05-06 ENCOUNTER — Telehealth (INDEPENDENT_AMBULATORY_CARE_PROVIDER_SITE_OTHER): Payer: Medicaid Other | Admitting: "Endocrinology

## 2023-05-06 ENCOUNTER — Encounter: Payer: Self-pay | Admitting: "Endocrinology

## 2023-05-06 DIAGNOSIS — E0590 Thyrotoxicosis, unspecified without thyrotoxic crisis or storm: Secondary | ICD-10-CM

## 2023-05-06 NOTE — Progress Notes (Signed)
The patient reports they are currently: Ellen Stone. I spent 7-8 minutes on the video with the patient on the date of service. I spent an additional 10 minutes on pre- and post-visit activities on the date of service.   The patient was physically located in [place] or a state in which I am permitted to provide care. The patient and/or parent/guardian understood that s/he may incur co-pays and cost sharing, and agreed to the telemedicine visit. The visit was reasonable and appropriate under the circumstances given the patient's presentation at the time.  The patient and/or parent/guardian has been advised of the potential risks and limitations of this mode of treatment (including, but not limited to, the absence of in-person examination) and has agreed to be treated using telemedicine. The patient's/patient's family's questions regarding telemedicine have been answered.   The patient and/or parent/guardian has also been advised to contact their provider's office for worsening conditions, and seek emergency medical treatment and/or call 911 if the patient deems either necessary.     Outpatient Endocrinology Note Ellen La Cienega, MD  05/06/23   Ellen Stone 05/25/1986 [id number]  Referring Provider: Bess Kinds, MD Primary Care Provider: Bess Kinds, MD Subjective  No chief complaint on file.   Assessment & Plan  There are no diagnoses linked to this encounter.   Ellen Stone is currently taking methimazole 5 mg qd. Patient currently clinically euthyroid. Free T4 and Free T3 normal in 03/2023.  Educated on thyroid axis. TSI/TRAb -ve. Discussed the etiology for hyperthyroidism. Educated on thyroid axis.  Recommend the following: Continue methimazole 5 mg once a day and repeat labs in 7-8 weeks. Repeat labs sooner if symptoms of hyper or hypothyroidism develop.  Counseled previously on: -side effects of Methimazole including but not limited to allergic reaction, rash, bone  marrow suppression, liver dysfunction and teratogenic potential -implications in pregnancy and breastfeeding -compliance and follow up needs    If you notice any symptoms of worsening fatigue, fever with sore throat, loss of appetite, yellowing of eyes, dark urine, joint pains, sores in the mouth, itchy rash, light colored stools or abdominal pain, or decide pregnancy/breastfeeding, please stop the medication and call us immediately as this can be a serious side effect of the medication.   I have reviewed current medications, nurse's notes, allergies, vital signs, past medical and surgical history, family medical history, and social history for this encounter. Counseled patient on symptoms, examination findings, lab findings, imaging results, treatment decisions and monitoring and prognosis. The patient understood the recommendations and agrees with the treatment plan. All questions regarding treatment plan were fully answered.   Return in about 2 months (around 07/06/2023) for visit + labs before next visit.   Ellen Arvin, MD  05/06/23   I have reviewed current medications, nurse's notes, allergies, vital signs, past medical and surgical history, family medical history, and social history for this encounter. Counseled patient on symptoms, examination findings, lab findings, imaging results, treatment decisions and monitoring and prognosis. The patient understood the recommendations and agrees with the treatment plan. All questions regarding treatment plan were fully answered.   History of Present Illness Ellen Stone is a 37 y.o. year old female who presents to our clinic with hyperthyroidism.  Was on crack cocaine that led to hospitalization leading to thyroid work up.  Started methimazole since 2022.  Has been on methimazole 5 mg every day since 02/2022.   Symptoms suggestive of HYPOTHYROIDISM:  fatigue Yes weight gain No cold intolerance  No, feels  cold at baseline constipation   Yes, on iron, takes colace  Symptoms suggestive of HYPERTHYROIDISM:  weight loss  No heat intolerance No hyperdefecation  No palpitations  No  Compressive symptoms:  dysphagia  No dysphonia  No positional dyspnea (especially with simultaneous arms elevation)  No  Smokes  Yes On biotin  No Personal history of head/neck surgery/irradiation  No  Adverse Drug Effects from Methimazole (MMI): rash No fever No throat pain No arthritis No mouth ulcers No jaundice No loss of appetite No lymphadenopathy No  Physical Exam  There were no vitals taken for this visit. Constitutional: well developed, well nourished Head: normocephalic, atraumatic, no exophthalmos Eyes: sclera anicteric, no redness Neck: no thyromegaly, no thyroid tenderness; no nodules palpated Lungs: normal respiratory effort Neurology: alert and oriented, no fine hand tremor Skin: dry, no appreciable rashes Musculoskeletal: no appreciable defects Psychiatric: normal mood and affect  Allergies Allergies  Allergen Reactions   Amoxicillin Nausea And Vomiting    N/v, dizziness    Current Medications Patient's Medications  New Prescriptions   No medications on file  Previous Medications   CETIRIZINE (ZYRTEC) 10 MG TABLET    TAKE 1 TABLET(10 MG) BY MOUTH DAILY   FERROUS SULFATE 325 (65 FE) MG TABLET    Take 1 tablet (325 mg total) by mouth daily with breakfast. Please take with a source of Vitamin C   FLUTICASONE (FLONASE) 50 MCG/ACT NASAL SPRAY    SHAKE LIQUID AND USE 2 SPRAYS IN EACH NOSTRIL DAILY   FOLIC ACID (FOLVITE) 1 MG TABLET    TAKE 1 TABLET(1 MG) BY MOUTH DAILY   IBUPROFEN (ADVIL) 200 MG TABLET    Take 200 mg by mouth every 6 (six) hours as needed.   METHIMAZOLE (TAPAZOLE) 5 MG TABLET    TAKE 1 TABLET(5 MG) BY MOUTH DAILY   ONDANSETRON (ZOFRAN-ODT) 4 MG DISINTEGRATING TABLET    Take 1 tablet (4 mg total) by mouth every 8 (eight) hours as needed.  Modified Medications   No medications on file   Discontinued Medications   No medications on file    Past Medical History Past Medical History:  Diagnosis Date   Chronic bronchitis (HCC)    Cocaine abuse (HCC)    History of trichomoniasis 01/27/2020   Hyperphosphatemia 10/20/2015   Hypertension    Low TSH level 09/29/2019   Recurrent UTI (urinary tract infection)    "none lately; since I started drinking more water" (10/18/2015)   Transaminitis 01/26/2021   Vitamin D deficiency 10/20/2015    Past Surgical History Past Surgical History:  Procedure Laterality Date   FRACTURE SURGERY     ORIF PROXIMAL TIBIAL PLATEAU FRACTURE Left 10/18/2015   ORIF TIBIA PLATEAU Left 10/18/2015   Procedure: OPEN REDUCTION INTERNAL FIXATION (ORIF) LEFT TIBIAL PLATEAU;  Surgeon: Myrene Galas, MD;  Location: [facility] OR;  Service: Orthopedics;  Laterality: Left;    Family History family history includes Clotting disorder in her maternal uncle and mother; Heart Problems in her father.  Social History Social History   Socioeconomic History   Marital status: Married    Spouse name: Not on file   Number of children: Not on file   Years of education: Not on file   Highest education level: Not on file  Occupational History   Not on file  Tobacco Use   Smoking status: Every Day    Current packs/day: 0.50    Average packs/day: 0.5 packs/day for 10.0 years (5.0 ttl pk-yrs)    Types: Cigarettes  Passive exposure: Current   Smokeless tobacco: Never  Vaping Use   Vaping status: Never Used  Substance and Sexual Activity   Alcohol use: Yes    Comment: 10/18/2015 "down to maybe 1 beer q 2 weeks or so"   Drug use: Yes    Types: Marijuana    Comment: 10/18/2015 "quit cocaine recently"   Sexual activity: Yes    Birth control/protection: Implant  Other Topics Concern   Not on file  Social History Narrative   Not on file   Social Determinants of Health   Financial Resource Strain: Not on File (03/26/2022)   Received from [place], [place]   Financial [employer]    Financial Resource Strain: 0  Food Insecurity: Not on File (03/26/2022)   Received from [place], [place]   Food Insecurity    Food: 0  Transportation Needs: Not on File (03/26/2022)   Received from [employer] Needs    Transportation: 0  Physical Activity: Not on File (03/26/2022)   Received from [place], [place]   Physical Activity    Physical Activity: 0  Stress: Not on File (03/26/2022)   Received from [place], [place]   Stress    Stress: 0  Social Connections: Not on File (03/24/2023)   Received from [employer]   Social Connections    Connectedness: 0  Intimate Partner Violence: Not on file    Laboratory Investigations Lab Results  Component Value Date   TSH 1.52 01/11/2023   TSH 2.360 10/18/2022   TSH 2.420 12/05/2021   FREET4 0.71 04/05/2023   FREET4 0.57 (L) 01/11/2023   FREET4 0.79 (L) 12/05/2021     Lab Results  Component Value Date   TSI <89 01/11/2023     No components found for: "TRAB"   Lab Results  Component Value Date   CHOL 148 10/18/2022   Lab Results  Component Value Date   HDL 47 10/18/2022   Lab Results  Component Value Date   LDLCALC 89 10/18/2022   Lab Results  Component Value Date   TRIG 57 10/18/2022   Lab Results  Component Value Date   CHOLHDL 3.1 10/18/2022   Lab Results  Component Value Date   CREATININE 0.73 04/25/2023   No results found for: "GFR"    Component Value Date/Time   NA 135 04/25/2023 1521   NA 137 09/17/2019 1606   K 3.4 (L) 04/25/2023 1521   CL 104 04/25/2023 1521   CO2 25 04/25/2023 1521   GLUCOSE 79 04/25/2023 1521   BUN 9 04/25/2023 1521   BUN 8 09/17/2019 1606   CREATININE 0.73 04/25/2023 1521   CALCIUM 9.5 04/25/2023 1521   CALCIUM 9.2 10/20/2015 0444   PROT 8.2 (H) 04/25/2023 1521   ALBUMIN 4.6 04/25/2023 1521   AST 12 (L) 04/25/2023 1521   ALT 8 04/25/2023 1521   ALKPHOS 74 04/25/2023 1521   BILITOT 0.3 04/25/2023 1521   GFRNONAA >60 04/25/2023 1521   GFRAA >60 03/10/2020 1103       Latest Ref Rng & Units 04/25/2023    3:21 PM 01/28/2023   10:22 AM 11/02/2022   11:33 AM  BMP  Glucose 70 - 99 mg/dL 79  83  83   BUN 6 - 20 mg/dL 9  10  5    Creatinine 0.44 - 1.00 mg/dL 1.30  8.65  7.84   Sodium 135 - 145 mmol/L 135  137  137   Potassium 3.5 - 5.1 mmol/L 3.4  3.7  4.2  Chloride 98 - 111 mmol/L 104  109  108   CO2 22 - 32 mmol/L 25  22  26    Calcium 8.9 - 10.3 mg/dL 9.5  8.8  9.2        Component Value Date/Time   WBC 8.8 04/25/2023 1521   WBC 16.4 (H) 06/17/2021 1416   RBC 4.89 04/25/2023 1521   RBC 4.83 04/25/2023 1521   HGB 14.0 04/25/2023 1521   HGB 12.3 08/24/2021 1605   HCT 42.1 04/25/2023 1521   HCT 36.2 08/24/2021 1605   PLT 397 04/25/2023 1521   PLT 555 (H) 08/24/2021 1605   MCV 87.2 04/25/2023 1521   MCV 104 (H) 08/24/2021 1605   MCH 29.0 04/25/2023 1521   MCHC 33.3 04/25/2023 1521   RDW 13.5 04/25/2023 1521   RDW 12.5 08/24/2021 1605   LYMPHSABS 2.1 04/25/2023 1521   LYMPHSABS 1.9 08/24/2021 1605   MONOABS 0.6 04/25/2023 1521   EOSABS 0.1 04/25/2023 1521   EOSABS 0.1 08/24/2021 1605   BASOSABS 0.1 04/25/2023 1521   BASOSABS 0.1 08/24/2021 1605      Parts of this note may have been dictated using voice recognition software. There may be variances in spelling and vocabulary which are unintentional. Not all errors are proofread. Please notify the Thereasa Parkin if any discrepancies are noted or if the meaning of any statement is not clear.

## 2023-05-20 ENCOUNTER — Inpatient Hospital Stay: Payer: Medicaid Other

## 2023-05-27 ENCOUNTER — Inpatient Hospital Stay (HOSPITAL_BASED_OUTPATIENT_CLINIC_OR_DEPARTMENT_OTHER): Payer: Medicaid Other | Admitting: Hematology and Oncology

## 2023-05-27 ENCOUNTER — Inpatient Hospital Stay: Payer: Medicaid Other | Attending: Hematology and Oncology

## 2023-05-27 ENCOUNTER — Other Ambulatory Visit: Payer: Self-pay | Admitting: Hematology and Oncology

## 2023-05-27 ENCOUNTER — Inpatient Hospital Stay: Payer: Medicaid Other

## 2023-05-27 VITALS — BP 117/75 | HR 67 | Temp 97.9°F | Resp 14 | Wt 209.8 lb

## 2023-05-27 DIAGNOSIS — E538 Deficiency of other specified B group vitamins: Secondary | ICD-10-CM | POA: Insufficient documentation

## 2023-05-27 DIAGNOSIS — D649 Anemia, unspecified: Secondary | ICD-10-CM

## 2023-05-27 DIAGNOSIS — N920 Excessive and frequent menstruation with regular cycle: Secondary | ICD-10-CM | POA: Insufficient documentation

## 2023-05-27 DIAGNOSIS — D500 Iron deficiency anemia secondary to blood loss (chronic): Secondary | ICD-10-CM

## 2023-05-27 DIAGNOSIS — D72829 Elevated white blood cell count, unspecified: Secondary | ICD-10-CM | POA: Insufficient documentation

## 2023-05-27 LAB — RETIC PANEL
Immature Retic Fract: 7.8 % (ref 2.3–15.9)
RBC.: 4.76 MIL/uL (ref 3.87–5.11)
Retic Count, Absolute: 75.7 10*3/uL (ref 19.0–186.0)
Retic Ct Pct: 1.6 % (ref 0.4–3.1)
Reticulocyte Hemoglobin: 32.4 pg (ref >27.9)

## 2023-05-27 LAB — CBC WITH DIFFERENTIAL (CANCER CENTER ONLY)
Abs Immature Granulocytes: 0.02 10*3/uL (ref 0.00–0.07)
Basophils Absolute: 0.1 10*3/uL (ref 0.0–0.1)
Basophils Relative: 1 %
Eosinophils Absolute: 0.2 10*3/uL (ref 0.0–0.5)
Eosinophils Relative: 2 %
HCT: 42.3 % (ref 36.0–46.0)
Hemoglobin: 14 g/dL (ref 12.0–15.0)
Immature Granulocytes: 0 %
Lymphocytes Relative: 21 %
Lymphs Abs: 2 10*3/uL (ref 0.7–4.0)
MCH: 28.8 pg (ref 26.0–34.0)
MCHC: 33.1 g/dL (ref 30.0–36.0)
MCV: 87 fL (ref 80.0–100.0)
Monocytes Absolute: 0.6 10*3/uL (ref 0.1–1.0)
Monocytes Relative: 6 %
Neutro Abs: 6.6 10*3/uL (ref 1.7–7.7)
Neutrophils Relative %: 70 %
Platelet Count: 365 10*3/uL (ref 150–400)
RBC: 4.86 MIL/uL (ref 3.87–5.11)
RDW: 14.4 % (ref 11.5–15.5)
WBC Count: 9.4 10*3/uL (ref 4.0–10.5)
nRBC: 0 % (ref 0.0–0.2)

## 2023-05-27 LAB — CMP (CANCER CENTER ONLY)
ALT: 6 U/L (ref 0–44)
AST: 11 U/L — ABNORMAL LOW (ref 15–41)
Albumin: 4 g/dL (ref 3.5–5.0)
Alkaline Phosphatase: 62 U/L (ref 38–126)
Anion gap: 6 (ref 5–15)
BUN: 8 mg/dL (ref 6–20)
CO2: 23 mmol/L (ref 22–32)
Calcium: 8.9 mg/dL (ref 8.9–10.3)
Chloride: 109 mmol/L (ref 98–111)
Creatinine: 0.86 mg/dL (ref 0.44–1.00)
GFR, Estimated: >60 mL/min (ref >60)
Glucose, Bld: 83 mg/dL (ref 70–99)
Potassium: 3.7 mmol/L (ref 3.5–5.1)
Sodium: 138 mmol/L (ref 135–145)
Total Bilirubin: 0.3 mg/dL (ref <1.2)
Total Protein: 7.2 g/dL (ref 6.5–8.1)

## 2023-05-27 LAB — IRON AND IRON BINDING CAPACITY (CC-WL,HP ONLY)
Iron: 231 ug/dL — ABNORMAL HIGH (ref 28–170)
Saturation Ratios: 68 % — ABNORMAL HIGH (ref 10.4–31.8)
TIBC: 342 ug/dL (ref 250–450)
UIBC: 111 ug/dL — ABNORMAL LOW (ref 148–442)

## 2023-05-27 LAB — VITAMIN B12: Vitamin B-12: 284 pg/mL (ref 180–914)

## 2023-05-27 MED ORDER — CYANOCOBALAMIN 1000 MCG/ML IJ SOLN
1000.0000 ug | Freq: Once | INTRAMUSCULAR | Status: AC
  Administered 2023-05-27: 1000 ug via INTRAMUSCULAR
  Filled 2023-05-27: qty 1

## 2023-05-27 NOTE — Progress Notes (Signed)
[facility] [phone]   [phone]  PROGRESS NOTE  Patient Care Team: Bess Kinds, MD as PCP - General (Family Medicine)  Hematological/Oncological History # Thrombocytosis # Leukocytosis #Iron Deficiency Anemia 2/2 to GYN Bleeding 1) 11/26/2010: WBC 16.3, Hgb 15.0, MCV 88.5, Plt 207. First CBC on record 2) 10/13/2015: WBC 8.7, Hgb 12.2, MCV 88.8, Plt 442 3) 07/11/2019: WBC 15.5, Hgb 13.7, MCV 91.7, Plt 842 4) 08/18/2019: WBC 12.5, Hgb 13.5, MCV 90.5, Plt 842 5) 09/17/2019: WBC 10.3, Hgb 11.4, MCV 89, Plt 656 6) 12/03/2019: establish care with Dr. Leonides Schanz  7) 03/10/2020: WBC 10.1, Hgb 11.4, MCV 88.3, Plt 505 8) 06/13/2020: WBC 9.8, Hgb 13.5, MCV 95.2, Plt 427 9) 09/28/2020: WBC 11.0, Hgb 13.2, MCV 95.9, Plt 374 10) 03/31/2021: WBC 9.0, Hgb 11.7, MCV 107.7, Plt 530 11) 06/30/2021: WBC 11.8, Hgb 9.9, MCV 105.3, Plt 616 12) 10/27/2021: WBC 11.6, Hgb 10.5, MCV 97.8, Plt 243, Iron 283, saturation 85%, ferritin, 31, vitamin B12 138, folate 4.7 13) 03/21/2022: WBC 9.2, Hgb 12.2, MCV 90.0, Plt 406, Iron 85, saturation 23%, Ferritin 114, vitamin B12 191, folate 30.0   Interval History:  Ellen Stone 37 y.o. female with medical history significant for iron deficiency anemia presents for a follow up visit. The patient's last visit was on 11/02/2022. In the interim since the last visit she is had no major changes in her health.  On exam today Ellen Stone reports she has been doing great in the interim since her last visit.  She reports her energy is about an 8 out of 10.  Her energy levels are quite strong.  She is taking her iron pills consistently and reports it is not causing any constipation as she takes it with Colace.  She takes in the morning with her breakfast.  She is not having any lightheadedness, dizziness, or shortness of breath.  She reports that she is not having any bleeding or bruising.  She was on Nexplanon but then switched back to Depo shots.  She gets  a Depo shot every 3 months and has regular cycles.  She reports her cycles have not been particularly heavy.  She is also enjoying iron rich foods such as spinach and liver.  She did recently have a tooth removed but has not had any hospitalizations or emergency room visits.  She denies any lightheadedness, dizziness, or shortness of breath.. She denies fevers, chills, night sweats, shortness of breath, chest pain or cough. She has no other complaints. Rest of the 10 point ROS is below.   MEDICAL HISTORY:  Past Medical History:  Diagnosis Date   Chronic bronchitis (HCC)    Cocaine abuse (HCC)    History of trichomoniasis 01/27/2020   Hyperphosphatemia 10/20/2015   Hypertension    Low TSH level 09/29/2019   Recurrent UTI (urinary tract infection)    "none lately; since I started drinking more water" (10/18/2015)   Transaminitis 01/26/2021   Vitamin D deficiency 10/20/2015    SURGICAL HISTORY: Past Surgical History:  Procedure Laterality Date   FRACTURE SURGERY     ORIF PROXIMAL TIBIAL PLATEAU FRACTURE Left 10/18/2015   ORIF TIBIA PLATEAU Left 10/18/2015   Procedure: OPEN REDUCTION INTERNAL FIXATION (ORIF) LEFT TIBIAL PLATEAU;  Surgeon: Myrene Galas, MD;  Location: [facility] OR;  Service: Orthopedics;  Laterality: Left;    SOCIAL HISTORY: Social History   Socioeconomic History   Marital status: Married    Spouse name: Not on file   Number of children: Not on  file   Years of education: Not on file   Highest education level: Not on file  Occupational History   Not on file  Tobacco Use   Smoking status: Every Day    Current packs/day: 0.50    Average packs/day: 0.5 packs/day for 10.0 years (5.0 ttl pk-yrs)    Types: Cigarettes    Passive exposure: Current   Smokeless tobacco: Never  Vaping Use   Vaping status: Never Used  Substance and Sexual Activity   Alcohol use: Yes    Comment: 10/18/2015 "down to maybe 1 beer q 2 weeks or so"   Drug use: Yes    Types: Marijuana    Comment: 10/18/2015  "quit cocaine recently"   Sexual activity: Yes    Birth control/protection: Implant  Other Topics Concern   Not on file  Social History Narrative   Not on file   Social Determinants of Health   Financial Resource Strain: Not on File (03/26/2022)   Received from [employer], [place]    Financial Resource Strain: 0  Food Insecurity: Not on File (03/26/2022)   Received from [place], [place]   Food Insecurity    Food: 0  Transportation Needs: Not on File (03/26/2022)   Received from [employer] Needs    Transportation: 0  Physical Activity: Not on File (03/26/2022)   Received from [place], [place]   Physical Activity    Physical Activity: 0  Stress: Not on File (03/26/2022)   Received from [facility], [place]   Stress    Stress: 0  Social Connections: Not on File (03/24/2023)   Received from [employer]   Social Connections    Connectedness: 0  Intimate Partner Violence: Not on file    FAMILY HISTORY: Family History  Problem Relation Age of Onset   Clotting disorder Mother    Heart Problems Father    Clotting disorder Maternal Uncle     ALLERGIES:  is allergic to amoxicillin.  MEDICATIONS:  Current Outpatient Medications  Medication Sig Dispense Refill   cetirizine (ZYRTEC) 10 MG tablet TAKE 1 TABLET(10 MG) BY MOUTH DAILY 90 tablet 0   ferrous sulfate 325 (65 FE) MG tablet Take 1 tablet (325 mg total) by mouth daily with breakfast. Please take with a source of Vitamin C 90 tablet 3   fluticasone (FLONASE) 50 MCG/ACT nasal spray SHAKE LIQUID AND USE 2 SPRAYS IN EACH NOSTRIL DAILY 48 g 0   folic acid (FOLVITE) 1 MG tablet TAKE 1 TABLET(1 MG) BY MOUTH DAILY 30 tablet 3   ibuprofen (ADVIL) 200 MG tablet Take 200 mg by mouth every 6 (six) hours as needed.     methimazole (TAPAZOLE) 5 MG tablet TAKE 1 TABLET(5 MG) BY MOUTH DAILY 30 tablet 2   ondansetron (ZOFRAN-ODT) 4 MG disintegrating tablet Take 1 tablet (4 mg total) by mouth every 8 (eight) hours as needed. 20  tablet 0   Current Facility-Administered Medications  Medication Dose Route Frequency Provider Last Rate Last Admin   medroxyPROGESTERone Acetate SUSY 150 mg  150 mg Intramuscular Once Bess Kinds, MD        REVIEW OF SYSTEMS:   Constitutional: ( - ) fevers, ( - )  chills , ( - ) night sweats Eyes: ( - ) blurriness of vision, ( - ) double vision, ( - ) watery eyes Ears, nose, mouth, throat, and face: ( - ) mucositis, ( - ) sore throat Respiratory: ( - ) cough, ( - ) dyspnea, ( - )  wheezes Cardiovascular: ( - ) palpitation, ( - ) chest discomfort, ( - ) lower extremity swelling Gastrointestinal:  ( - ) nausea, ( - ) heartburn, ( - ) change in bowel habits Skin: ( - ) abnormal skin rashes Lymphatics: ( - ) new lymphadenopathy, ( - ) easy bruising Neurological: ( - ) numbness, ( - ) tingling, ( - ) new weaknesses Behavioral/Psych: ( - ) mood change, ( - ) new changes  All other systems were reviewed with the patient and are negative.  PHYSICAL EXAMINATION: ECOG PERFORMANCE STATUS: 0 - Asymptomatic  Vitals:   05/27/23 1539  BP: 117/75  Pulse: 67  Resp: 14  Temp: 97.9 F (36.6 C)  SpO2: 99%   Filed Weights   05/27/23 1539  Weight: 209 lb 12.8 oz (95.2 kg)    GENERAL: well appearing young [place] female. alert, no distress and comfortable SKIN: skin color, texture, turgor are normal, no rashes or significant lesions EYES: conjunctiva are pink and non-injected, sclera clear LUNGS: clear to auscultation and percussion with normal breathing effort HEART: regular rate & rhythm and no murmurs and no lower extremity edema Musculoskeletal: no cyanosis of digits and no clubbing  PSYCH: alert & oriented x 3, fluent speech NEURO: no focal motor/sensory deficits  LABORATORY DATA:  I have reviewed the data as listed    Latest Ref Rng & Units 05/27/2023    2:50 PM 04/25/2023    3:21 PM 01/28/2023   10:22 AM  CBC  WBC 4.0 - 10.5 K/uL 9.4  8.8  9.4   Hemoglobin 12.0 -  15.0 g/dL 72.5  36.6  44.0   Hematocrit 36.0 - 46.0 % 42.3  42.1  35.2   Platelets 150 - 400 K/uL 365  397  339        Latest Ref Rng & Units 05/27/2023    2:50 PM 04/25/2023    3:21 PM 01/28/2023   10:22 AM  CMP  Glucose 70 - 99 mg/dL 83  79  83   BUN 6 - 20 mg/dL 8  9  10    Creatinine 0.44 - 1.00 mg/dL 3.47  4.25  9.56   Sodium 135 - 145 mmol/L 138  135  137   Potassium 3.5 - 5.1 mmol/L 3.7  3.4  3.7   Chloride 98 - 111 mmol/L 109  104  109   CO2 22 - 32 mmol/L 23  25  22    Calcium 8.9 - 10.3 mg/dL 8.9  9.5  8.8   Total Protein 6.5 - 8.1 g/dL 7.2  8.2  7.4   Total Bilirubin <1.2 mg/dL 0.3  0.3  0.2   Alkaline Phos 38 - 126 U/L 62  74  58   AST 15 - 41 U/L 11  12  12    ALT 0 - 44 U/L 6  8  7      No results found for: "MPROTEIN" No results found for: "KPAFRELGTCHN", "LAMBDASER", "KAPLAMBRATIO"   RADIOGRAPHIC STUDIES: No results found.  ASSESSMENT & PLAN Ellen Stone 37 y.o. female with medical history significant for iron deficiency anemia presents for a follow up visit.   #Iron Deficiency Anemia 2/2 to GYN Blood loss --Labs today show anemia with Hgb 14.0, WBC 9.4, MCV 87, Plt 365 --continue PO ferrous sulfate 325mg  PO daily with a source of vitamin C.  --patient established with OB/GYN and has nexplanon to control menstrual bleeding but no longer helping. Advised to follow up with OB/GYN.  --No indication for  IV iron at this time.   #Vitamin B12/Folate deficiency: --Labs previously show vitamin B12 level is 209, folate level is 16.6. Labs from today are pending.  --currently on folic acid 1 mg once daily --On 11/03/21, received B12 injection weekly x 4 then transitioned to monthly injections. Most recent injection on 01/19/2022. Recommend to continue with monthly injections.   # Thrombocytosis, chronic.  # Leukocytosis, Resolved --leukocytosis resolved, etiology unclear. MPN workup with BCR/ABL FISH and JAK2 w/ reflex was negative.   Follow up: --Continue with  monthly b12 injections x 6 --RTC in 6 months with labs  No orders of the defined types were placed in this encounter.  All questions were answered. The patient knows to call the clinic with any problems, questions or concerns.  I have spent a total of 25 minutes minutes of face-to-face and non-face-to-face time, preparing to see the patient, performing a medically appropriate examination, counseling and educating the patient, ordering medications/tests, documenting clinical information in the electronic health record, and care coordination.   Ulysees Barns, MD Department of Hematology/Oncology [facility] Phone: [phone] Pager: [phone] Email: [email or web address]  05/27/2023 4:16 PM

## 2023-05-27 NOTE — Patient Instructions (Signed)
Vitamin B12 Deficiency Vitamin B12 deficiency occurs when the body does not have enough of this important vitamin. The body needs this vitamin: To make red blood cells. To make DNA. This is the genetic material inside cells. To help the nerves work properly so they can carry messages from the brain to the body. Vitamin B12 deficiency can cause health problems, such as not having enough red blood cells in the blood (anemia). This can lead to nerve damage if untreated. What are the causes? This condition may be caused by: Not eating enough foods that contain vitamin B12. Not having enough stomach acid and digestive fluids to properly absorb vitamin B12 from the food that you eat. Having certain diseases that make it hard to absorb vitamin B12. These diseases include Crohn's disease, chronic pancreatitis, and cystic fibrosis. An autoimmune disorder in which the body does not make enough of a protein (intrinsic factor) within the stomach, resulting in not enough absorption of vitamin B12. Having a surgery in which part of the stomach or small intestine is removed. Taking certain medicines that make it hard for the body to absorb vitamin B12. These include: Heartburn medicines, such as antacids and proton pump inhibitors. Some medicines that are used to treat diabetes. What increases the risk? The following factors may make you more likely to develop a vitamin B12 deficiency: Being an older adult. Eating a vegetarian or vegan diet that does not include any foods that come from animals. Eating a poor diet while you are pregnant. Taking certain medicines. Having alcoholism. What are the signs or symptoms? In some cases, there are no symptoms of this condition. If the condition leads to anemia or nerve damage, various symptoms may occur, such as: Weakness. Tiredness (fatigue). Loss of appetite. Numbness or tingling in your hands and feet. Redness and burning of the tongue. Depression,  confusion, or memory problems. Trouble walking. If anemia is severe, symptoms can include: Shortness of breath. Dizziness. Rapid heart rate. How is this diagnosed? This condition may be diagnosed with a blood test to measure the level of vitamin B12 in your blood. You may also have other tests, including: A group of tests that measure certain characteristics of blood cells (complete blood count, CBC). A blood test to measure intrinsic factor. A procedure where a thin tube with a camera on the end is used to look into your stomach or intestines (endoscopy). Other tests may be needed to discover the cause of the deficiency. How is this treated? Treatment for this condition depends on the cause. This condition may be treated by: Changing your eating and drinking habits, such as: Eating more foods that contain vitamin B12. Drinking less alcohol or no alcohol. Getting vitamin B12 injections. Taking vitamin B12 supplements by mouth (orally). Your health care provider will tell you which dose is best for you. Follow these instructions at home: Eating and drinking  Include foods in your diet that come from animals and contain a lot of vitamin B12. These include: Meats and poultry. This includes beef, pork, chicken, turkey, and organ meats, such as liver. Seafood. This includes clams, rainbow trout, salmon, tuna, and haddock. Eggs. Dairy foods such as milk, yogurt, and cheese. Eat foods that have vitamin B12 added to them (are fortified), such as ready-to-eat breakfast cereals. Check the label on the package to see if a food is fortified. The items listed above may not be a complete list of foods and beverages you can eat and drink. Contact a dietitian for   more information. Alcohol use Do not drink alcohol if: Your health care provider tells you not to drink. You are pregnant, may be pregnant, or are planning to become pregnant. If you drink alcohol: Limit how much you have to: 0-1 drink a  day for women. 0-2 drinks a day for men. Know how much alcohol is in your drink. In the [place] one drink equals one 12 oz bottle of beer (355 mL), one 5 oz glass of wine (148 mL), or one 1 oz glass of hard liquor (44 mL). General instructions Get vitamin B12 injections if told to by your health care provider. Take supplements only as told by your health care provider. Follow the directions carefully. Keep all follow-up visits. This is important. Contact a health care provider if: Your symptoms come back. Your symptoms get worse or do not improve with treatment. Get help right away: You develop shortness of breath. You have a rapid heart rate. You have chest pain. You become dizzy or you faint. These symptoms may be an emergency. Get help right away. Call 911. Do not wait to see if the symptoms will go away. Do not drive yourself to the hospital. Summary Vitamin B12 deficiency occurs when the body does not have enough of this important vitamin. Common causes include not eating enough foods that contain vitamin B12, not being able to absorb vitamin B12 from the food that you eat, having a surgery in which part of the stomach or small intestine is removed, or taking certain medicines. Eat foods that have vitamin B12 in them. Treatment may include making a change in the way you eat and drink, getting vitamin B12 injections, or taking vitamin B12 supplements. This information is not intended to replace advice given to you by your health care provider. Make sure you discuss any questions you have with your health care provider. Document Revised: 02/24/2021 Document Reviewed: 02/24/2021 Elsevier Patient Education  2024 Elsevier Inc.  

## 2023-05-28 ENCOUNTER — Telehealth: Payer: Self-pay | Admitting: Hematology and Oncology

## 2023-05-28 LAB — FERRITIN: Ferritin: 40 ng/mL (ref 11–307)

## 2023-05-30 LAB — METHYLMALONIC ACID, SERUM: Methylmalonic Acid, Quantitative: 99 nmol/L (ref 0–378)

## 2023-05-31 ENCOUNTER — Ambulatory Visit (INDEPENDENT_AMBULATORY_CARE_PROVIDER_SITE_OTHER): Payer: Medicaid Other | Admitting: Family Medicine

## 2023-05-31 ENCOUNTER — Encounter: Payer: Self-pay | Admitting: Family Medicine

## 2023-05-31 VITALS — BP 110/80 | HR 78 | Ht 71.0 in | Wt 211.2 lb

## 2023-05-31 DIAGNOSIS — Z3009 Encounter for other general counseling and advice on contraception: Secondary | ICD-10-CM | POA: Diagnosis not present

## 2023-05-31 DIAGNOSIS — Z3200 Encounter for pregnancy test, result unknown: Secondary | ICD-10-CM | POA: Diagnosis present

## 2023-05-31 LAB — POCT URINE PREGNANCY: Preg Test, Ur: NEGATIVE

## 2023-05-31 MED ORDER — MEDROXYPROGESTERONE ACETATE 150 MG/ML IM SUSP
150.0000 mg | Freq: Once | INTRAMUSCULAR | Status: AC
  Administered 2023-05-31: 150 mg via INTRAMUSCULAR

## 2023-05-31 NOTE — Patient Instructions (Signed)
-   Your pregnancy test was negative today. We gave you a depo provera dose today. You will be due for another shot in 3 months.

## 2023-05-31 NOTE — Progress Notes (Unsigned)
    SUBJECTIVE:   CHIEF COMPLAINT / HPI:   LT is a 37yo F w/ hx of hyperthyroidism, HTN that p/f depo. - Last shot was due in September - Has been sexually active since September - Currently on period, last period 10/19-10/26.  - Same partner for past 5 yrs, not interested in STI check today.   OBJECTIVE:   BP 110/80   Pulse 78   Ht 5\' 11"  (1.803 m)   Wt 211 lb 3.2 oz (95.8 kg)   LMP 05/30/2023   SpO2 100%   BMI 29.46 kg/m   General: Alert, pleasant woman. NAD. HEENT: NCAT. MMM. CV: RRR, no murmurs.  Resp: CTAB, no wheezing or crackles. Normal WOB on RA.  Abm: Soft, nontender, nondistended. BS present. Ext: Moves all ext spontaneously Skin: Warm, well perfused   ASSESSMENT/PLAN:   Assessment & Plan Birth control counseling Late for depo. Pregnancy test negative today. Is currently on period as well. Depo shot given today. Declines STI testing    Lincoln Brigham, MD [facility]

## 2023-06-03 NOTE — Assessment & Plan Note (Signed)
Late for depo. Pregnancy test negative today. Is currently on period as well. Depo shot given today. Declines STI testing

## 2023-06-15 ENCOUNTER — Other Ambulatory Visit: Payer: Self-pay | Admitting: Student

## 2023-06-15 ENCOUNTER — Other Ambulatory Visit: Payer: Self-pay | Admitting: Physician Assistant

## 2023-06-15 DIAGNOSIS — E538 Deficiency of other specified B group vitamins: Secondary | ICD-10-CM

## 2023-06-15 DIAGNOSIS — E0590 Thyrotoxicosis, unspecified without thyrotoxic crisis or storm: Secondary | ICD-10-CM

## 2023-06-28 ENCOUNTER — Telehealth: Payer: Self-pay | Admitting: *Deleted

## 2023-06-28 ENCOUNTER — Inpatient Hospital Stay: Payer: Medicaid Other | Attending: Hematology and Oncology

## 2023-06-28 DIAGNOSIS — N920 Excessive and frequent menstruation with regular cycle: Secondary | ICD-10-CM | POA: Insufficient documentation

## 2023-06-28 DIAGNOSIS — E538 Deficiency of other specified B group vitamins: Secondary | ICD-10-CM | POA: Insufficient documentation

## 2023-06-28 DIAGNOSIS — D500 Iron deficiency anemia secondary to blood loss (chronic): Secondary | ICD-10-CM | POA: Insufficient documentation

## 2023-06-28 NOTE — Telephone Encounter (Signed)
TCT patient regarding her appt today for her monthly B12 injection. Spoke with her. She sates she did not know she had an appt. She does not use MyChart. Advised we will try to get her rescheduled for next week.  High priority scheduling message sent

## 2023-07-04 ENCOUNTER — Inpatient Hospital Stay: Payer: Medicaid Other

## 2023-07-04 ENCOUNTER — Telehealth: Payer: Self-pay | Admitting: Hematology and Oncology

## 2023-07-04 VITALS — BP 130/90 | HR 72 | Resp 16

## 2023-07-04 DIAGNOSIS — E538 Deficiency of other specified B group vitamins: Secondary | ICD-10-CM

## 2023-07-04 DIAGNOSIS — D500 Iron deficiency anemia secondary to blood loss (chronic): Secondary | ICD-10-CM | POA: Diagnosis present

## 2023-07-04 DIAGNOSIS — N920 Excessive and frequent menstruation with regular cycle: Secondary | ICD-10-CM | POA: Diagnosis present

## 2023-07-04 MED ORDER — CYANOCOBALAMIN 1000 MCG/ML IJ SOLN
1000.0000 ug | Freq: Once | INTRAMUSCULAR | Status: AC
  Administered 2023-07-04: 1000 ug via INTRAMUSCULAR
  Filled 2023-07-04: qty 1

## 2023-07-04 NOTE — Patient Instructions (Signed)
Vitamin B12 Deficiency Vitamin B12 deficiency occurs when the body does not have enough of this important vitamin. The body needs this vitamin: To make red blood cells. To make DNA. This is the genetic material inside cells. To help the nerves work properly so they can carry messages from the brain to the body. Vitamin B12 deficiency can cause health problems, such as not having enough red blood cells in the blood (anemia). This can lead to nerve damage if untreated. What are the causes? This condition may be caused by: Not eating enough foods that contain vitamin B12. Not having enough stomach acid and digestive fluids to properly absorb vitamin B12 from the food that you eat. Having certain diseases that make it hard to absorb vitamin B12. These diseases include Crohn's disease, chronic pancreatitis, and cystic fibrosis. An autoimmune disorder in which the body does not make enough of a protein (intrinsic factor) within the stomach, resulting in not enough absorption of vitamin B12. Having a surgery in which part of the stomach or small intestine is removed. Taking certain medicines that make it hard for the body to absorb vitamin B12. These include: Heartburn medicines, such as antacids and proton pump inhibitors. Some medicines that are used to treat diabetes. What increases the risk? The following factors may make you more likely to develop a vitamin B12 deficiency: Being an older adult. Eating a vegetarian or vegan diet that does not include any foods that come from animals. Eating a poor diet while you are pregnant. Taking certain medicines. Having alcoholism. What are the signs or symptoms? In some cases, there are no symptoms of this condition. If the condition leads to anemia or nerve damage, various symptoms may occur, such as: Weakness. Tiredness (fatigue). Loss of appetite. Numbness or tingling in your hands and feet. Redness and burning of the tongue. Depression,  confusion, or memory problems. Trouble walking. If anemia is severe, symptoms can include: Shortness of breath. Dizziness. Rapid heart rate. How is this diagnosed? This condition may be diagnosed with a blood test to measure the level of vitamin B12 in your blood. You may also have other tests, including: A group of tests that measure certain characteristics of blood cells (complete blood count, CBC). A blood test to measure intrinsic factor. A procedure where a thin tube with a camera on the end is used to look into your stomach or intestines (endoscopy). Other tests may be needed to discover the cause of the deficiency. How is this treated? Treatment for this condition depends on the cause. This condition may be treated by: Changing your eating and drinking habits, such as: Eating more foods that contain vitamin B12. Drinking less alcohol or no alcohol. Getting vitamin B12 injections. Taking vitamin B12 supplements by mouth (orally). Your health care provider will tell you which dose is best for you. Follow these instructions at home: Eating and drinking  Include foods in your diet that come from animals and contain a lot of vitamin B12. These include: Meats and poultry. This includes beef, pork, chicken, turkey, and organ meats, such as liver. Seafood. This includes clams, rainbow trout, salmon, tuna, and haddock. Eggs. Dairy foods such as milk, yogurt, and cheese. Eat foods that have vitamin B12 added to them (are fortified), such as ready-to-eat breakfast cereals. Check the label on the package to see if a food is fortified. The items listed above may not be a complete list of foods and beverages you can eat and drink. Contact a dietitian for   more information. Alcohol use Do not drink alcohol if: Your health care provider tells you not to drink. You are pregnant, may be pregnant, or are planning to become pregnant. If you drink alcohol: Limit how much you have to: 0-1 drink a  day for women. 0-2 drinks a day for men. Know how much alcohol is in your drink. In the [place] one drink equals one 12 oz bottle of beer (355 mL), one 5 oz glass of wine (148 mL), or one 1 oz glass of hard liquor (44 mL). General instructions Get vitamin B12 injections if told to by your health care provider. Take supplements only as told by your health care provider. Follow the directions carefully. Keep all follow-up visits. This is important. Contact a health care provider if: Your symptoms come back. Your symptoms get worse or do not improve with treatment. Get help right away: You develop shortness of breath. You have a rapid heart rate. You have chest pain. You become dizzy or you faint. These symptoms may be an emergency. Get help right away. Call 911. Do not wait to see if the symptoms will go away. Do not drive yourself to the hospital. Summary Vitamin B12 deficiency occurs when the body does not have enough of this important vitamin. Common causes include not eating enough foods that contain vitamin B12, not being able to absorb vitamin B12 from the food that you eat, having a surgery in which part of the stomach or small intestine is removed, or taking certain medicines. Eat foods that have vitamin B12 in them. Treatment may include making a change in the way you eat and drink, getting vitamin B12 injections, or taking vitamin B12 supplements. This information is not intended to replace advice given to you by your health care provider. Make sure you discuss any questions you have with your health care provider. Document Revised: 02/24/2021 Document Reviewed: 02/24/2021 Elsevier Patient Education  2024 Elsevier Inc.  

## 2023-07-26 ENCOUNTER — Inpatient Hospital Stay: Payer: Medicaid Other

## 2023-08-02 ENCOUNTER — Inpatient Hospital Stay: Payer: Medicaid Other | Attending: Hematology and Oncology

## 2023-08-02 VITALS — BP 114/81 | HR 79 | Temp 98.3°F | Resp 20

## 2023-08-02 DIAGNOSIS — E538 Deficiency of other specified B group vitamins: Secondary | ICD-10-CM | POA: Insufficient documentation

## 2023-08-02 DIAGNOSIS — D500 Iron deficiency anemia secondary to blood loss (chronic): Secondary | ICD-10-CM | POA: Diagnosis present

## 2023-08-02 DIAGNOSIS — N920 Excessive and frequent menstruation with regular cycle: Secondary | ICD-10-CM | POA: Diagnosis present

## 2023-08-02 MED ORDER — CYANOCOBALAMIN 1000 MCG/ML IJ SOLN
1000.0000 ug | Freq: Once | INTRAMUSCULAR | Status: AC
  Administered 2023-08-02: 1000 ug via INTRAMUSCULAR
  Filled 2023-08-02: qty 1

## 2023-08-23 ENCOUNTER — Inpatient Hospital Stay: Payer: Medicaid Other

## 2023-08-23 ENCOUNTER — Inpatient Hospital Stay: Payer: Medicaid Other | Attending: Hematology and Oncology

## 2023-08-23 ENCOUNTER — Other Ambulatory Visit: Payer: Self-pay | Admitting: Hematology and Oncology

## 2023-08-23 VITALS — BP 101/78 | HR 83 | Temp 98.8°F | Resp 20

## 2023-08-23 DIAGNOSIS — D500 Iron deficiency anemia secondary to blood loss (chronic): Secondary | ICD-10-CM | POA: Insufficient documentation

## 2023-08-23 DIAGNOSIS — E538 Deficiency of other specified B group vitamins: Secondary | ICD-10-CM | POA: Insufficient documentation

## 2023-08-23 DIAGNOSIS — N920 Excessive and frequent menstruation with regular cycle: Secondary | ICD-10-CM | POA: Insufficient documentation

## 2023-08-23 LAB — CMP (CANCER CENTER ONLY)
ALT: 6 U/L (ref 0–44)
AST: 10 U/L — ABNORMAL LOW (ref 15–41)
Albumin: 4.3 g/dL (ref 3.5–5.0)
Alkaline Phosphatase: 70 U/L (ref 38–126)
Anion gap: 7 (ref 5–15)
BUN: 7 mg/dL (ref 6–20)
CO2: 22 mmol/L (ref 22–32)
Calcium: 9 mg/dL (ref 8.9–10.3)
Chloride: 108 mmol/L (ref 98–111)
Creatinine: 0.71 mg/dL (ref 0.44–1.00)
GFR, Estimated: >60 mL/min (ref >60)
Glucose, Bld: 109 mg/dL — ABNORMAL HIGH (ref 70–99)
Potassium: 3.7 mmol/L (ref 3.5–5.1)
Sodium: 137 mmol/L (ref 135–145)
Total Bilirubin: 0.3 mg/dL (ref 0.0–1.2)
Total Protein: 7.7 g/dL (ref 6.5–8.1)

## 2023-08-23 LAB — CBC WITH DIFFERENTIAL (CANCER CENTER ONLY)
Abs Immature Granulocytes: 0.03 10*3/uL (ref 0.00–0.07)
Basophils Absolute: 0.1 10*3/uL (ref 0.0–0.1)
Basophils Relative: 1 %
Eosinophils Absolute: 0.1 10*3/uL (ref 0.0–0.5)
Eosinophils Relative: 1 %
HCT: 41.3 % (ref 36.0–46.0)
Hemoglobin: 13.8 g/dL (ref 12.0–15.0)
Immature Granulocytes: 0 %
Lymphocytes Relative: 20 %
Lymphs Abs: 2.1 10*3/uL (ref 0.7–4.0)
MCH: 29.6 pg (ref 26.0–34.0)
MCHC: 33.4 g/dL (ref 30.0–36.0)
MCV: 88.4 fL (ref 80.0–100.0)
Monocytes Absolute: 0.3 10*3/uL (ref 0.1–1.0)
Monocytes Relative: 3 %
Neutro Abs: 8 10*3/uL — ABNORMAL HIGH (ref 1.7–7.7)
Neutrophils Relative %: 75 %
Platelet Count: 370 10*3/uL (ref 150–400)
RBC: 4.67 MIL/uL (ref 3.87–5.11)
RDW: 13.3 % (ref 11.5–15.5)
Smear Review: NORMAL
WBC Count: 10.7 10*3/uL — ABNORMAL HIGH (ref 4.0–10.5)
nRBC: 0 % (ref 0.0–0.2)

## 2023-08-23 LAB — RETIC PANEL
Immature Retic Fract: 11.9 % (ref 2.3–15.9)
RBC.: 4.68 MIL/uL (ref 3.87–5.11)
Retic Count, Absolute: 62.7 10*3/uL (ref 19.0–186.0)
Retic Ct Pct: 1.3 % (ref 0.4–3.1)
Reticulocyte Hemoglobin: 33.7 pg (ref >27.9)

## 2023-08-23 LAB — VITAMIN B12: Vitamin B-12: 334 pg/mL (ref 180–914)

## 2023-08-23 LAB — FOLATE: Folate: 17 ng/mL (ref >5.9)

## 2023-08-23 LAB — IRON AND IRON BINDING CAPACITY (CC-WL,HP ONLY)
Iron: 54 ug/dL (ref 28–170)
Saturation Ratios: 16 % (ref 10.4–31.8)
TIBC: 342 ug/dL (ref 250–450)
UIBC: 288 ug/dL (ref 148–442)

## 2023-08-23 MED ORDER — CYANOCOBALAMIN 1000 MCG/ML IJ SOLN
1000.0000 ug | Freq: Once | INTRAMUSCULAR | Status: AC
  Administered 2023-08-23: 1000 ug via INTRAMUSCULAR
  Filled 2023-08-23: qty 1

## 2023-08-26 ENCOUNTER — Ambulatory Visit (INDEPENDENT_AMBULATORY_CARE_PROVIDER_SITE_OTHER): Payer: Medicaid Other

## 2023-08-26 DIAGNOSIS — Z30019 Encounter for initial prescription of contraceptives, unspecified: Secondary | ICD-10-CM

## 2023-08-26 LAB — FERRITIN: Ferritin: 75 ng/mL (ref 11–307)

## 2023-08-26 MED ORDER — MEDROXYPROGESTERONE ACETATE 150 MG/ML IM SUSP
150.0000 mg | Freq: Once | INTRAMUSCULAR | Status: AC
  Administered 2023-08-26: 150 mg via INTRAMUSCULAR

## 2023-08-26 NOTE — Progress Notes (Signed)
Patient here today for Depo Provera injection and is within her dates.    Last contraceptive appt was 05/31/2023.  Depo given in RUOQ today. Site unremarkable & patient tolerated injection.    Next injection due 11/11/2023-11/25/2023.  Reminder card given.

## 2023-08-27 LAB — METHYLMALONIC ACID, SERUM: Methylmalonic Acid, Quantitative: 86 nmol/L (ref 0–378)

## 2023-09-13 ENCOUNTER — Other Ambulatory Visit: Payer: Self-pay | Admitting: Student

## 2023-09-13 ENCOUNTER — Other Ambulatory Visit: Payer: Self-pay | Admitting: Hematology and Oncology

## 2023-09-13 DIAGNOSIS — E0590 Thyrotoxicosis, unspecified without thyrotoxic crisis or storm: Secondary | ICD-10-CM

## 2023-09-13 DIAGNOSIS — E538 Deficiency of other specified B group vitamins: Secondary | ICD-10-CM

## 2023-09-13 NOTE — Telephone Encounter (Signed)
 Already filled today

## 2023-09-20 ENCOUNTER — Inpatient Hospital Stay: Payer: Medicaid Other | Attending: Hematology and Oncology

## 2023-09-20 VITALS — BP 109/80 | HR 72 | Temp 98.4°F | Resp 20

## 2023-09-20 DIAGNOSIS — E538 Deficiency of other specified B group vitamins: Secondary | ICD-10-CM | POA: Insufficient documentation

## 2023-09-20 DIAGNOSIS — N920 Excessive and frequent menstruation with regular cycle: Secondary | ICD-10-CM | POA: Insufficient documentation

## 2023-09-20 DIAGNOSIS — D500 Iron deficiency anemia secondary to blood loss (chronic): Secondary | ICD-10-CM | POA: Insufficient documentation

## 2023-09-20 MED ORDER — CYANOCOBALAMIN 1000 MCG/ML IJ SOLN
1000.0000 ug | Freq: Once | INTRAMUSCULAR | Status: AC
  Administered 2023-09-20: 1000 ug via INTRAMUSCULAR
  Filled 2023-09-20: qty 1

## 2023-10-04 ENCOUNTER — Other Ambulatory Visit: Payer: Self-pay | Admitting: Hematology and Oncology

## 2023-10-04 DIAGNOSIS — E538 Deficiency of other specified B group vitamins: Secondary | ICD-10-CM

## 2023-10-18 ENCOUNTER — Inpatient Hospital Stay: Payer: Medicaid Other | Attending: Hematology and Oncology

## 2023-10-18 ENCOUNTER — Other Ambulatory Visit: Payer: Self-pay

## 2023-10-18 DIAGNOSIS — D500 Iron deficiency anemia secondary to blood loss (chronic): Secondary | ICD-10-CM | POA: Diagnosis present

## 2023-10-18 DIAGNOSIS — E538 Deficiency of other specified B group vitamins: Secondary | ICD-10-CM | POA: Insufficient documentation

## 2023-10-18 DIAGNOSIS — N920 Excessive and frequent menstruation with regular cycle: Secondary | ICD-10-CM | POA: Insufficient documentation

## 2023-10-18 MED ORDER — CYANOCOBALAMIN 1000 MCG/ML IJ SOLN
1000.0000 ug | Freq: Once | INTRAMUSCULAR | Status: AC
  Administered 2023-10-18: 1000 ug via INTRAMUSCULAR
  Filled 2023-10-18: qty 1

## 2023-11-08 ENCOUNTER — Inpatient Hospital Stay: Payer: Medicaid Other

## 2023-11-08 ENCOUNTER — Other Ambulatory Visit: Payer: Self-pay | Admitting: Hematology and Oncology

## 2023-11-08 DIAGNOSIS — E538 Deficiency of other specified B group vitamins: Secondary | ICD-10-CM

## 2023-11-08 DIAGNOSIS — D500 Iron deficiency anemia secondary to blood loss (chronic): Secondary | ICD-10-CM

## 2023-11-08 LAB — CMP (CANCER CENTER ONLY)
ALT: 8 U/L (ref 0–44)
AST: 11 U/L — ABNORMAL LOW (ref 15–41)
Albumin: 4.3 g/dL (ref 3.5–5.0)
Alkaline Phosphatase: 68 U/L (ref 38–126)
Anion gap: 6 (ref 5–15)
BUN: 5 mg/dL — ABNORMAL LOW (ref 6–20)
CO2: 24 mmol/L (ref 22–32)
Calcium: 9.1 mg/dL (ref 8.9–10.3)
Chloride: 107 mmol/L (ref 98–111)
Creatinine: 0.74 mg/dL (ref 0.44–1.00)
GFR, Estimated: >60 mL/min (ref >60)
Glucose, Bld: 76 mg/dL (ref 70–99)
Potassium: 3.8 mmol/L (ref 3.5–5.1)
Sodium: 137 mmol/L (ref 135–145)
Total Bilirubin: 0.3 mg/dL (ref 0.0–1.2)
Total Protein: 7.6 g/dL (ref 6.5–8.1)

## 2023-11-08 LAB — RETIC PANEL
Immature Retic Fract: 16.5 % — ABNORMAL HIGH (ref 2.3–15.9)
RBC.: 4.7 MIL/uL (ref 3.87–5.11)
Retic Count, Absolute: 75.7 10*3/uL (ref 19.0–186.0)
Retic Ct Pct: 1.6 % (ref 0.4–3.1)
Reticulocyte Hemoglobin: 33.6 pg (ref >27.9)

## 2023-11-08 LAB — CBC WITH DIFFERENTIAL (CANCER CENTER ONLY)
Abs Immature Granulocytes: 0.02 10*3/uL (ref 0.00–0.07)
Basophils Absolute: 0.1 10*3/uL (ref 0.0–0.1)
Basophils Relative: 1 %
Eosinophils Absolute: 0.1 10*3/uL (ref 0.0–0.5)
Eosinophils Relative: 1 %
HCT: 39.9 % (ref 36.0–46.0)
Hemoglobin: 13.5 g/dL (ref 12.0–15.0)
Immature Granulocytes: 0 %
Lymphocytes Relative: 20 %
Lymphs Abs: 2.1 10*3/uL (ref 0.7–4.0)
MCH: 29.3 pg (ref 26.0–34.0)
MCHC: 33.8 g/dL (ref 30.0–36.0)
MCV: 86.7 fL (ref 80.0–100.0)
Monocytes Absolute: 0.6 10*3/uL (ref 0.1–1.0)
Monocytes Relative: 6 %
Neutro Abs: 7.7 10*3/uL (ref 1.7–7.7)
Neutrophils Relative %: 72 %
Platelet Count: 348 10*3/uL (ref 150–400)
RBC: 4.6 MIL/uL (ref 3.87–5.11)
RDW: 13.8 % (ref 11.5–15.5)
WBC Count: 10.6 10*3/uL — ABNORMAL HIGH (ref 4.0–10.5)
nRBC: 0 % (ref 0.0–0.2)

## 2023-11-08 LAB — VITAMIN B12: Vitamin B-12: 379 pg/mL (ref 180–914)

## 2023-11-08 LAB — IRON AND IRON BINDING CAPACITY (CC-WL,HP ONLY)
Iron: 109 ug/dL (ref 28–170)
Saturation Ratios: 34 % — ABNORMAL HIGH (ref 10.4–31.8)
TIBC: 319 ug/dL (ref 250–450)
UIBC: 210 ug/dL (ref 148–442)

## 2023-11-11 ENCOUNTER — Ambulatory Visit: Payer: Medicaid Other

## 2023-11-11 DIAGNOSIS — Z3042 Encounter for surveillance of injectable contraceptive: Secondary | ICD-10-CM

## 2023-11-11 LAB — FERRITIN: Ferritin: 123 ng/mL (ref 11–307)

## 2023-11-11 LAB — METHYLMALONIC ACID, SERUM: Methylmalonic Acid, Quantitative: 112 nmol/L (ref 0–378)

## 2023-11-11 MED ORDER — MEDROXYPROGESTERONE ACETATE 150 MG/ML IM SUSP
150.0000 mg | Freq: Once | INTRAMUSCULAR | Status: AC
  Administered 2023-11-11: 150 mg via INTRAMUSCULAR

## 2023-11-11 NOTE — Progress Notes (Signed)
 Patient here today for Depo Provera  injection and is within her dates.    Last contraceptive appt was 05/31/2023  Depo given in LUOQ today.  Site unremarkable & patient tolerated injection.    Next injection due 01/27/2024-02/10/2024.  Reminder card given.    Elsie Halo, RN

## 2023-11-15 ENCOUNTER — Inpatient Hospital Stay: Payer: Medicaid Other | Attending: Hematology and Oncology | Admitting: Hematology and Oncology

## 2023-11-15 ENCOUNTER — Inpatient Hospital Stay: Payer: Medicaid Other

## 2023-11-15 VITALS — BP 122/83 | HR 99 | Temp 97.5°F | Resp 16 | Wt 210.6 lb

## 2023-11-15 DIAGNOSIS — D500 Iron deficiency anemia secondary to blood loss (chronic): Secondary | ICD-10-CM

## 2023-11-15 DIAGNOSIS — D649 Anemia, unspecified: Secondary | ICD-10-CM

## 2023-11-15 DIAGNOSIS — E538 Deficiency of other specified B group vitamins: Secondary | ICD-10-CM | POA: Diagnosis present

## 2023-11-15 DIAGNOSIS — N920 Excessive and frequent menstruation with regular cycle: Secondary | ICD-10-CM | POA: Insufficient documentation

## 2023-11-15 MED ORDER — CYANOCOBALAMIN 1000 MCG/ML IJ SOLN
1000.0000 ug | Freq: Once | INTRAMUSCULAR | Status: AC
  Administered 2023-11-15: 1000 ug via INTRAMUSCULAR
  Filled 2023-11-15: qty 1

## 2023-11-15 NOTE — Progress Notes (Signed)
 [facility] [phone]   [phone]  PROGRESS NOTE  Patient Care Team: Wilhemena Harbour, MD as PCP - General (Family Medicine)  Hematological/Oncological History # Thrombocytosis # Leukocytosis #Iron Deficiency Anemia 2/2 to GYN Bleeding 1) 11/26/2010: WBC 16.3, Hgb 15.0, MCV 88.5, Plt 207. First CBC on record 2) 10/13/2015: WBC 8.7, Hgb 12.2, MCV 88.8, Plt 442 3) 07/11/2019: WBC 15.5, Hgb 13.7, MCV 91.7, Plt 842 4) 08/18/2019: WBC 12.5, Hgb 13.5, MCV 90.5, Plt 842 5) 09/17/2019: WBC 10.3, Hgb 11.4, MCV 89, Plt 656 6) 12/03/2019: establish care with Dr. Rosaline Coma  7) 03/10/2020: WBC 10.1, Hgb 11.4, MCV 88.3, Plt 505 8) 06/13/2020: WBC 9.8, Hgb 13.5, MCV 95.2, Plt 427 9) 09/28/2020: WBC 11.0, Hgb 13.2, MCV 95.9, Plt 374 10) 03/31/2021: WBC 9.0, Hgb 11.7, MCV 107.7, Plt 530 11) 06/30/2021: WBC 11.8, Hgb 9.9, MCV 105.3, Plt 616 12) 10/27/2021: WBC 11.6, Hgb 10.5, MCV 97.8, Plt 243, Iron 283, saturation 85%, ferritin, 31, vitamin B12 138, folate 4.7 13) 03/21/2022: WBC 9.2, Hgb 12.2, MCV 90.0, Plt 406, Iron 85, saturation 23%, Ferritin 114, vitamin B12 191, folate 30.0   Interval History:  Ellen Stone 38 y.o. female with medical history significant for iron deficiency anemia presents for a follow up visit. The patient's last visit was on 05/27/2023. In the interim since the last visit she is had no major changes in her health.  On exam today Ellen Stone reports she has been well overall in the interim since her last visit 6 months ago.  She reports that she can be working at home and gets shortness of breath and dizziness.  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  She notes she also has some occasional palpitations of the heart but no headache or chest pain.  She denies any nosebleeds, gum bleeding, or dark stools.  She reports her menstrual cycles are getting better since the Nexplanon  was removed.  She reports that she has been eating a regular  diet and has a good appetite.  She has been having trouble with constipation for which she is taking Colace.  She continues taking her iron p.o. daily but is not having any nausea or vomiting as a result of it.  She otherwise denies any fevers, chills, sweats, nausea, vomiting or diarrhea.  Full 10 point ROS is otherwise negative.  MEDICAL HISTORY:  Past Medical History:  Diagnosis Date   Chronic bronchitis (HCC)    Cocaine abuse (HCC)    History of trichomoniasis 01/27/2020   Hyperphosphatemia 10/20/2015   Hypertension    Low TSH level 09/29/2019   Recurrent UTI (urinary tract infection)    "none lately; since I started drinking more water" (10/18/2015)   Transaminitis 01/26/2021   Vitamin D  deficiency 10/20/2015    SURGICAL HISTORY: Past Surgical History:  Procedure Laterality Date   FRACTURE SURGERY     ORIF PROXIMAL TIBIAL PLATEAU FRACTURE Left 10/18/2015   ORIF TIBIA PLATEAU Left 10/18/2015   Procedure: OPEN REDUCTION INTERNAL FIXATION (ORIF) LEFT TIBIAL PLATEAU;  Surgeon: Hardy Lia, MD;  Location: [facility] OR;  Service: Orthopedics;  Laterality: Left;    SOCIAL HISTORY: Social History   Socioeconomic History   Marital status: Married    Spouse name: Not on file   Number of children: Not on file   Years of education: Not on file   Highest education level: Not on file  Occupational History   Not on file  Tobacco Use   Smoking status: Every Day  Current packs/day: 0.50    Average packs/day: 0.5 packs/day for 10.0 years (5.0 ttl pk-yrs)    Types: Cigarettes    Passive exposure: Current   Smokeless tobacco: Never  Vaping Use   Vaping status: Never Used  Substance and Sexual Activity   Alcohol use: Yes    Comment: 10/18/2015 "down to maybe 1 beer q 2 weeks or so"   Drug use: Yes    Types: Marijuana    Comment: 10/18/2015 "quit cocaine recently"   Sexual activity: Yes    Birth control/protection: Implant  Other Topics Concern   Not on file  Social History Narrative   Not on  file   Social Drivers of Health   Financial Resource Strain: Not on File (03/26/2022)   Received from [facility] [employer] [place]    Financial Resource Strain: 0  Food Insecurity: Not on File (03/26/2022)   Received from [place], [place]   Food Insecurity    Food: 0  Transportation Needs: Not on File (03/26/2022)   Received from [employer] Needs    Transportation: 0  Physical Activity: Not on File (03/26/2022)   Received from [place], [place]   Physical Activity    Physical Activity: 0  Stress: Not on File (03/26/2022)   Received from [place], [place]   Stress    Stress: 0  Social Connections: Not on File (03/24/2023)   Received from [facility]   Social Connections    Connectedness: 0  Intimate Partner Violence: Not on file    FAMILY HISTORY: Family History  Problem Relation Age of Onset   Clotting disorder Mother    Heart Problems Father    Clotting disorder Maternal Uncle     ALLERGIES:  is allergic to amoxicillin .  MEDICATIONS:  Current Outpatient Medications  Medication Sig Dispense Refill   cetirizine  (ZYRTEC ) 10 MG tablet TAKE 1 TABLET(10 MG) BY MOUTH DAILY 90 tablet 0   ferrous sulfate  325 (65 FE) MG tablet Take 1 tablet (325 mg total) by mouth daily with breakfast. Please take with a source of Vitamin C  90 tablet 3   fluticasone  (FLONASE ) 50 MCG/ACT nasal spray SHAKE LIQUID AND USE 2 SPRAYS IN EACH NOSTRIL DAILY 48 g 0   folic acid  (FOLVITE ) 1 MG tablet TAKE 1 TABLET(1 MG) BY MOUTH DAILY 30 tablet 3   ibuprofen  (ADVIL ) 200 MG tablet Take 200 mg by mouth every 6 (six) hours as needed.     methimazole  (TAPAZOLE ) 5 MG tablet TAKE 1 TABLET(5 MG) BY MOUTH DAILY 30 tablet 2   ondansetron  (ZOFRAN -ODT) 4 MG disintegrating tablet Take 1 tablet (4 mg total) by mouth every 8 (eight) hours as needed. 20 tablet 0   Current Facility-Administered Medications  Medication Dose Route Frequency Provider Last Rate Last Admin   medroxyPROGESTERone  Acetate SUSY 150 mg  150 mg  Intramuscular Once Sowell, Brandon, MD        REVIEW OF SYSTEMS:   Constitutional: ( - ) fevers, ( - )  chills , ( - ) night sweats Eyes: ( - ) blurriness of vision, ( - ) double vision, ( - ) watery eyes Ears, nose, mouth, throat, and face: ( - ) mucositis, ( - ) sore throat Respiratory: ( - ) cough, ( - ) dyspnea, ( - ) wheezes Cardiovascular: ( - ) palpitation, ( - ) chest discomfort, ( - ) lower extremity swelling Gastrointestinal:  ( - ) nausea, ( - ) heartburn, ( - ) change in bowel habits Skin: ( - )  abnormal skin rashes Lymphatics: ( - ) new lymphadenopathy, ( - ) easy bruising Neurological: ( - ) numbness, ( - ) tingling, ( - ) new weaknesses Behavioral/Psych: ( - ) mood change, ( - ) new changes  All other systems were reviewed with the patient and are negative.  PHYSICAL EXAMINATION: ECOG PERFORMANCE STATUS: 0 - Asymptomatic  Vitals:   11/15/23 1446  BP: 122/83  Pulse: 99  Resp: 16  Temp: (!) 97.5 F (36.4 C)  SpO2: 99%    Filed Weights   11/15/23 1446  Weight: 210 lb 9.6 oz (95.5 kg)     GENERAL: well appearing young [place] American female. alert, no distress and comfortable SKIN: skin color, texture, turgor are normal, no rashes or significant lesions EYES: conjunctiva are pink and non-injected, sclera clear LUNGS: clear to auscultation and percussion with normal breathing effort HEART: regular rate & rhythm and no murmurs and no lower extremity edema Musculoskeletal: no cyanosis of digits and no clubbing  PSYCH: alert & oriented x 3, fluent speech NEURO: no focal motor/sensory deficits  LABORATORY DATA:  I have reviewed the data as listed    Latest Ref Rng & Units 11/08/2023    3:31 PM 08/23/2023    3:06 PM 05/27/2023    2:50 PM  CBC  WBC 4.0 - 10.5 K/uL 10.6  10.7  9.4   Hemoglobin 12.0 - 15.0 g/dL 16.1  09.6  04.5   Hematocrit 36.0 - 46.0 % 39.9  41.3  42.3   Platelets 150 - 400 K/uL 348  370  365        Latest Ref Rng & Units 11/08/2023     3:31 PM 08/23/2023    3:06 PM 05/27/2023    2:50 PM  CMP  Glucose 70 - 99 mg/dL 76  409  83   BUN 6 - 20 mg/dL 5  7  8    Creatinine 0.44 - 1.00 mg/dL 8.11  9.14  7.82   Sodium 135 - 145 mmol/L 137  137  138   Potassium 3.5 - 5.1 mmol/L 3.8  3.7  3.7   Chloride 98 - 111 mmol/L 107  108  109   CO2 22 - 32 mmol/L 24  22  23    Calcium  8.9 - 10.3 mg/dL 9.1  9.0  8.9   Total Protein 6.5 - 8.1 g/dL 7.6  7.7  7.2   Total Bilirubin 0.0 - 1.2 mg/dL 0.3  0.3  0.3   Alkaline Phos 38 - 126 U/L 68  70  62   AST 15 - 41 U/L 11  10  11    ALT 0 - 44 U/L 8  6  6      No results found for: "MPROTEIN" No results found for: "KPAFRELGTCHN", "LAMBDASER", "KAPLAMBRATIO"   RADIOGRAPHIC STUDIES: No results found.  ASSESSMENT & PLAN Ellen Stone 38 y.o. female with medical history significant for iron deficiency anemia presents for a follow up visit.   #Iron Deficiency Anemia 2/2 to GYN Blood loss --Labs today show anemia with Hgb 13.5, MCV 86.7, platelets 348, white blood cell count 10.6.  Ferritin 123 with iron sat of 34%. --continue PO ferrous sulfate  325mg  PO daily with a source of vitamin C .  --patient had Nexplanon  removed due to near continuous cycle.  Not currently on birth control. --No indication for IV iron at this time.   #Vitamin B12/Folate deficiency: --currently on folic acid  1 mg once daily --On 11/03/21, received B12 injection weekly x 4 then  transitioned to monthly injections.  --Recommend to continue with monthly injections.   # Thrombocytosis, chronic.  # Leukocytosis --leukocytosis resolved, etiology unclear. MPN workup with BCR/ABL FISH and JAK2 w/ reflex was negative.  -- Patient is an active smoker, most likely etiology for her current leukocytosis.  Follow up: --Continue with monthly b12 injections x 6 --RTC in 6 months with labs  No orders of the defined types were placed in this encounter.  All questions were answered. The patient knows to call the clinic with any  problems, questions or concerns.  I have spent a total of 30 minutes minutes of face-to-face and non-face-to-face time, preparing to see the patient, performing a medically appropriate examination, counseling and educating the patient, ordering medications/tests, documenting clinical information in the electronic health record, and care coordination.   Rogerio Clay, MD Department of Hematology/Oncology [facility] Phone: [phone] Pager: [phone] Email: [email or web address]  11/15/2023 3:06 PM

## 2023-11-29 ENCOUNTER — Other Ambulatory Visit: Payer: Self-pay | Admitting: Hematology and Oncology

## 2023-12-02 ENCOUNTER — Encounter: Payer: Self-pay | Admitting: Hematology and Oncology

## 2023-12-12 ENCOUNTER — Other Ambulatory Visit: Payer: Self-pay | Admitting: Student

## 2023-12-12 DIAGNOSIS — E0590 Thyrotoxicosis, unspecified without thyrotoxic crisis or storm: Secondary | ICD-10-CM

## 2023-12-13 ENCOUNTER — Inpatient Hospital Stay

## 2023-12-13 DIAGNOSIS — E538 Deficiency of other specified B group vitamins: Secondary | ICD-10-CM | POA: Diagnosis not present

## 2023-12-13 MED ORDER — CYANOCOBALAMIN 1000 MCG/ML IJ SOLN
1000.0000 ug | Freq: Once | INTRAMUSCULAR | Status: AC
  Administered 2023-12-13: 1000 ug via INTRAMUSCULAR
  Filled 2023-12-13: qty 1

## 2024-01-10 ENCOUNTER — Inpatient Hospital Stay: Attending: Hematology and Oncology

## 2024-01-10 DIAGNOSIS — E538 Deficiency of other specified B group vitamins: Secondary | ICD-10-CM | POA: Diagnosis present

## 2024-01-10 DIAGNOSIS — D500 Iron deficiency anemia secondary to blood loss (chronic): Secondary | ICD-10-CM | POA: Insufficient documentation

## 2024-01-10 DIAGNOSIS — N920 Excessive and frequent menstruation with regular cycle: Secondary | ICD-10-CM | POA: Diagnosis present

## 2024-01-10 MED ORDER — CYANOCOBALAMIN 1000 MCG/ML IJ SOLN
1000.0000 ug | Freq: Once | INTRAMUSCULAR | Status: AC
  Administered 2024-01-10: 1000 ug via INTRAMUSCULAR
  Filled 2024-01-10: qty 1

## 2024-01-31 ENCOUNTER — Ambulatory Visit (INDEPENDENT_AMBULATORY_CARE_PROVIDER_SITE_OTHER)

## 2024-01-31 DIAGNOSIS — Z30019 Encounter for initial prescription of contraceptives, unspecified: Secondary | ICD-10-CM

## 2024-01-31 MED ORDER — MEDROXYPROGESTERONE ACETATE 150 MG/ML IM SUSP
150.0000 mg | Freq: Once | INTRAMUSCULAR | Status: AC
Start: 2024-01-31 — End: ?

## 2024-01-31 NOTE — Progress Notes (Signed)
 Patient here today for Depo Provera  injection and is within her dates.    Last contraceptive appt was 05/31/2023.  Depo given in RUOQ today.  Site unremarkable & patient tolerated injection.    Next injection due 04/17/2024-05/01/2024.    Reminder card given.

## 2024-02-07 ENCOUNTER — Inpatient Hospital Stay

## 2024-02-07 ENCOUNTER — Inpatient Hospital Stay: Attending: Hematology and Oncology

## 2024-02-07 ENCOUNTER — Other Ambulatory Visit: Payer: Self-pay | Admitting: Hematology and Oncology

## 2024-02-07 DIAGNOSIS — E538 Deficiency of other specified B group vitamins: Secondary | ICD-10-CM

## 2024-02-07 DIAGNOSIS — D500 Iron deficiency anemia secondary to blood loss (chronic): Secondary | ICD-10-CM | POA: Diagnosis present

## 2024-02-07 DIAGNOSIS — N920 Excessive and frequent menstruation with regular cycle: Secondary | ICD-10-CM | POA: Diagnosis present

## 2024-02-07 LAB — CMP (CANCER CENTER ONLY)
ALT: 6 U/L (ref 0–44)
AST: 9 U/L — ABNORMAL LOW (ref 15–41)
Albumin: 3.9 g/dL (ref 3.5–5.0)
Alkaline Phosphatase: 68 U/L (ref 38–126)
Anion gap: 6 (ref 5–15)
BUN: 6 mg/dL (ref 6–20)
CO2: 20 mmol/L — ABNORMAL LOW (ref 22–32)
Calcium: 8.6 mg/dL — ABNORMAL LOW (ref 8.9–10.3)
Chloride: 112 mmol/L — ABNORMAL HIGH (ref 98–111)
Creatinine: 0.79 mg/dL (ref 0.44–1.00)
GFR, Estimated: >60 mL/min (ref >60)
Glucose, Bld: 92 mg/dL (ref 70–99)
Potassium: 3.7 mmol/L (ref 3.5–5.1)
Sodium: 138 mmol/L (ref 135–145)
Total Bilirubin: 0.3 mg/dL (ref 0.0–1.2)
Total Protein: 7.3 g/dL (ref 6.5–8.1)

## 2024-02-07 LAB — CBC WITH DIFFERENTIAL (CANCER CENTER ONLY)
Abs Immature Granulocytes: 0.03 K/uL (ref 0.00–0.07)
Basophils Absolute: 0.1 K/uL (ref 0.0–0.1)
Basophils Relative: 1 %
Eosinophils Absolute: 0.2 K/uL (ref 0.0–0.5)
Eosinophils Relative: 2 %
HCT: 41.2 % (ref 36.0–46.0)
Hemoglobin: 14.1 g/dL (ref 12.0–15.0)
Immature Granulocytes: 0 %
Lymphocytes Relative: 24 %
Lymphs Abs: 2.6 K/uL (ref 0.7–4.0)
MCH: 29.7 pg (ref 26.0–34.0)
MCHC: 34.2 g/dL (ref 30.0–36.0)
MCV: 86.7 fL (ref 80.0–100.0)
Monocytes Absolute: 0.7 K/uL (ref 0.1–1.0)
Monocytes Relative: 6 %
Neutro Abs: 7.4 K/uL (ref 1.7–7.7)
Neutrophils Relative %: 67 %
Platelet Count: 357 K/uL (ref 150–400)
RBC: 4.75 MIL/uL (ref 3.87–5.11)
RDW: 13.6 % (ref 11.5–15.5)
WBC Count: 10.9 K/uL — ABNORMAL HIGH (ref 4.0–10.5)
nRBC: 0 % (ref 0.0–0.2)

## 2024-02-07 LAB — IRON AND IRON BINDING CAPACITY (CC-WL,HP ONLY)
Iron: 71 ug/dL (ref 28–170)
Saturation Ratios: 22 % (ref 10.4–31.8)
TIBC: 325 ug/dL (ref 250–450)
UIBC: 254 ug/dL (ref 148–442)

## 2024-02-07 LAB — VITAMIN B12: Vitamin B-12: 365 pg/mL (ref 180–914)

## 2024-02-07 LAB — FERRITIN: Ferritin: 66 ng/mL (ref 11–307)

## 2024-02-07 MED ORDER — CYANOCOBALAMIN 1000 MCG/ML IJ SOLN
1000.0000 ug | Freq: Once | INTRAMUSCULAR | Status: AC
  Administered 2024-02-07: 1000 ug via INTRAMUSCULAR
  Filled 2024-02-07: qty 1

## 2024-02-09 ENCOUNTER — Encounter: Payer: Self-pay | Admitting: Hematology and Oncology

## 2024-03-06 ENCOUNTER — Inpatient Hospital Stay

## 2024-03-12 ENCOUNTER — Other Ambulatory Visit: Payer: Self-pay | Admitting: *Deleted

## 2024-03-12 DIAGNOSIS — E0590 Thyrotoxicosis, unspecified without thyrotoxic crisis or storm: Secondary | ICD-10-CM

## 2024-03-12 MED ORDER — METHIMAZOLE 5 MG PO TABS: 5.0000 mg | ORAL_TABLET | Freq: Every day | ORAL | 0 refills | Status: DC

## 2024-03-14 ENCOUNTER — Other Ambulatory Visit: Payer: Self-pay

## 2024-03-14 DIAGNOSIS — E0590 Thyrotoxicosis, unspecified without thyrotoxic crisis or storm: Secondary | ICD-10-CM

## 2024-03-17 ENCOUNTER — Telehealth: Payer: Self-pay

## 2024-03-17 NOTE — Telephone Encounter (Signed)
 Patient calls nurse line in regards to Methimazole  prescription.   This was sent over by PCP on 8/28. She reports the pharmacy has repeatedly stated they do not have the prescription.   I called the pharmacy. They are getting prescription ready for patient.   Patient has been updated.

## 2024-04-03 ENCOUNTER — Inpatient Hospital Stay: Attending: Hematology and Oncology

## 2024-04-03 DIAGNOSIS — N920 Excessive and frequent menstruation with regular cycle: Secondary | ICD-10-CM | POA: Insufficient documentation

## 2024-04-03 DIAGNOSIS — E538 Deficiency of other specified B group vitamins: Secondary | ICD-10-CM | POA: Insufficient documentation

## 2024-04-03 DIAGNOSIS — D500 Iron deficiency anemia secondary to blood loss (chronic): Secondary | ICD-10-CM | POA: Diagnosis present

## 2024-04-03 MED ORDER — CYANOCOBALAMIN 1000 MCG/ML IJ SOLN
1000.0000 ug | Freq: Once | INTRAMUSCULAR | Status: AC
  Administered 2024-04-03: 1000 ug via INTRAMUSCULAR
  Filled 2024-04-03: qty 1

## 2024-04-11 ENCOUNTER — Other Ambulatory Visit: Payer: Self-pay

## 2024-04-11 DIAGNOSIS — E0590 Thyrotoxicosis, unspecified without thyrotoxic crisis or storm: Secondary | ICD-10-CM

## 2024-04-17 ENCOUNTER — Telehealth: Payer: Self-pay

## 2024-04-17 NOTE — Telephone Encounter (Signed)
 Spoke with patient over the phone regarding methimazole  rx. Patient was to make an appointment to follow up with endocrinology to see if this medication is still needed. This appointment has been made for Nov 2025 and she will respond to my MyChart message with the date. Refilled rx, pt verbalizes understanding of importance of endocrine follow up for this medication.

## 2024-05-01 ENCOUNTER — Inpatient Hospital Stay: Attending: Hematology and Oncology

## 2024-05-01 ENCOUNTER — Inpatient Hospital Stay: Attending: Hematology and Oncology | Admitting: Hematology and Oncology

## 2024-05-01 ENCOUNTER — Inpatient Hospital Stay

## 2024-05-01 ENCOUNTER — Other Ambulatory Visit: Payer: Self-pay | Admitting: Hematology and Oncology

## 2024-05-01 VITALS — BP 126/78 | HR 73 | Temp 97.5°F | Resp 13 | Wt 222.2 lb

## 2024-05-01 DIAGNOSIS — D500 Iron deficiency anemia secondary to blood loss (chronic): Secondary | ICD-10-CM | POA: Insufficient documentation

## 2024-05-01 DIAGNOSIS — E538 Deficiency of other specified B group vitamins: Secondary | ICD-10-CM

## 2024-05-01 DIAGNOSIS — D75839 Thrombocytosis, unspecified: Secondary | ICD-10-CM | POA: Insufficient documentation

## 2024-05-01 DIAGNOSIS — D649 Anemia, unspecified: Secondary | ICD-10-CM

## 2024-05-01 DIAGNOSIS — I10 Essential (primary) hypertension: Secondary | ICD-10-CM | POA: Insufficient documentation

## 2024-05-01 DIAGNOSIS — D72829 Elevated white blood cell count, unspecified: Secondary | ICD-10-CM | POA: Diagnosis not present

## 2024-05-01 DIAGNOSIS — Z880 Allergy status to penicillin: Secondary | ICD-10-CM | POA: Diagnosis not present

## 2024-05-01 DIAGNOSIS — F17210 Nicotine dependence, cigarettes, uncomplicated: Secondary | ICD-10-CM | POA: Diagnosis not present

## 2024-05-01 DIAGNOSIS — Z793 Long term (current) use of hormonal contraceptives: Secondary | ICD-10-CM | POA: Insufficient documentation

## 2024-05-01 DIAGNOSIS — Z87440 Personal history of urinary (tract) infections: Secondary | ICD-10-CM | POA: Diagnosis not present

## 2024-05-01 DIAGNOSIS — N920 Excessive and frequent menstruation with regular cycle: Secondary | ICD-10-CM | POA: Diagnosis present

## 2024-05-01 LAB — CMP (CANCER CENTER ONLY)
ALT: 8 U/L (ref 0–44)
AST: 19 U/L (ref 15–41)
Albumin: 4.1 g/dL (ref 3.5–5.0)
Alkaline Phosphatase: 79 U/L (ref 38–126)
Anion gap: 10 (ref 5–15)
BUN: 7 mg/dL (ref 6–20)
CO2: 21 mmol/L — ABNORMAL LOW (ref 22–32)
Calcium: 8.9 mg/dL (ref 8.9–10.3)
Chloride: 104 mmol/L (ref 98–111)
Creatinine: 0.71 mg/dL (ref 0.44–1.00)
GFR, Estimated: >60 mL/min (ref >60)
Glucose, Bld: 73 mg/dL (ref 70–99)
Potassium: 3.9 mmol/L (ref 3.5–5.1)
Sodium: 135 mmol/L (ref 135–145)
Total Bilirubin: 0.2 mg/dL (ref 0.0–1.2)
Total Protein: 7.3 g/dL (ref 6.5–8.1)

## 2024-05-01 LAB — CBC WITH DIFFERENTIAL (CANCER CENTER ONLY)
Abs Immature Granulocytes: 0.03 K/uL (ref 0.00–0.07)
Basophils Absolute: 0.1 K/uL (ref 0.0–0.1)
Basophils Relative: 1 %
Eosinophils Absolute: 0.2 K/uL (ref 0.0–0.5)
Eosinophils Relative: 2 %
HCT: 38.1 % (ref 36.0–46.0)
Hemoglobin: 13.1 g/dL (ref 12.0–15.0)
Immature Granulocytes: 0 %
Lymphocytes Relative: 26 %
Lymphs Abs: 2.5 K/uL (ref 0.7–4.0)
MCH: 29.4 pg (ref 26.0–34.0)
MCHC: 34.4 g/dL (ref 30.0–36.0)
MCV: 85.6 fL (ref 80.0–100.0)
Monocytes Absolute: 0.6 K/uL (ref 0.1–1.0)
Monocytes Relative: 6 %
Neutro Abs: 6.1 K/uL (ref 1.7–7.7)
Neutrophils Relative %: 65 %
Platelet Count: 353 K/uL (ref 150–400)
RBC: 4.45 MIL/uL (ref 3.87–5.11)
RDW: 13.5 % (ref 11.5–15.5)
WBC Count: 9.4 K/uL (ref 4.0–10.5)
nRBC: 0 % (ref 0.0–0.2)

## 2024-05-01 LAB — RETIC PANEL
Immature Retic Fract: 9.4 % (ref 2.3–15.9)
RBC.: 4.5 MIL/uL (ref 3.87–5.11)
Retic Count, Absolute: 57.2 K/uL (ref 19.0–186.0)
Retic Ct Pct: 1.3 % (ref 0.4–3.1)
Reticulocyte Hemoglobin: 32.7 pg (ref >27.9)

## 2024-05-01 LAB — VITAMIN B12: Vitamin B-12: 496 pg/mL (ref 180–914)

## 2024-05-01 LAB — IRON AND IRON BINDING CAPACITY (CC-WL,HP ONLY)
Iron: 92 ug/dL (ref 28–170)
Saturation Ratios: 28 % (ref 10.4–31.8)
TIBC: 329 ug/dL (ref 250–450)
UIBC: 237 ug/dL

## 2024-05-01 LAB — FERRITIN: Ferritin: 157 ng/mL (ref 11–307)

## 2024-05-01 MED ORDER — CYANOCOBALAMIN 1000 MCG/ML IJ SOLN
1000.0000 ug | Freq: Once | INTRAMUSCULAR | Status: AC
  Administered 2024-05-01: 1000 ug via INTRAMUSCULAR
  Filled 2024-05-01: qty 1

## 2024-05-01 NOTE — Progress Notes (Signed)
 [facility] [phone]   [phone]  PROGRESS NOTE  Patient Care Team: Lupie Credit, DO as PCP - General (Family Medicine)  Hematological/Oncological History # Thrombocytosis # Leukocytosis #Iron Deficiency Anemia 2/2 to GYN Bleeding 1) 11/26/2010: WBC 16.3, Hgb 15.0, MCV 88.5, Plt 207. First CBC on record 2) 10/13/2015: WBC 8.7, Hgb 12.2, MCV 88.8, Plt 442 3) 07/11/2019: WBC 15.5, Hgb 13.7, MCV 91.7, Plt 842 4) 08/18/2019: WBC 12.5, Hgb 13.5, MCV 90.5, Plt 842 5) 09/17/2019: WBC 10.3, Hgb 11.4, MCV 89, Plt 656 6) 12/03/2019: establish care with Dr. Federico  7) 03/10/2020: WBC 10.1, Hgb 11.4, MCV 88.3, Plt 505 8) 06/13/2020: WBC 9.8, Hgb 13.5, MCV 95.2, Plt 427 9) 09/28/2020: WBC 11.0, Hgb 13.2, MCV 95.9, Plt 374 10) 03/31/2021: WBC 9.0, Hgb 11.7, MCV 107.7, Plt 530 11) 06/30/2021: WBC 11.8, Hgb 9.9, MCV 105.3, Plt 616 12) 10/27/2021: WBC 11.6, Hgb 10.5, MCV 97.8, Plt 243, Iron 283, saturation 85%, ferritin, 31, vitamin B12 138, folate 4.7 13) 03/21/2022: WBC 9.2, Hgb 12.2, MCV 90.0, Plt 406, Iron 85, saturation 23%, Ferritin 114, vitamin B12 191, folate 30.0   Interval History:  Ellen Stone 38 y.o. female with medical history significant for iron deficiency anemia presents for a follow up visit. The patient's last visit was on 11/15/2023. In the interim since the last visit she is had no major changes in her health.  On exam today Ellen Stone reports she is doing great overall.  She reports that she is feeling more energized and wakes up feeling good after taking her vitamin B12.  She reports that she is not having any issues with lightheadedness, dizziness, or shortness of breath.  She reports that she is also taking her iron pills and tolerating them well.  She notes that she is eating well and is up to 220 pounds, up from 210 pounds at her last visit.  She reports that she does have some occasional constipation for which she is taking Colace.  She is not having  any nosebleeds, gum bleeding, or blood in the urine/stool.  She reports her menstrual cycles are steady and typically last for about 4 days.  Overall she feels well and has no additional questions concerns or complaints today.  A full 10 point ROS is otherwise negative.  MEDICAL HISTORY:  Past Medical History:  Diagnosis Date   Chronic bronchitis (HCC)    Cocaine abuse (HCC)    History of trichomoniasis 01/27/2020   Hyperphosphatemia 10/20/2015   Hypertension    Low TSH level 09/29/2019   Recurrent UTI (urinary tract infection)    none lately; since I started drinking more water (10/18/2015)   Transaminitis 01/26/2021   Vitamin D  deficiency 10/20/2015    SURGICAL HISTORY: Past Surgical History:  Procedure Laterality Date   FRACTURE SURGERY     ORIF PROXIMAL TIBIAL PLATEAU FRACTURE Left 10/18/2015   ORIF TIBIA PLATEAU Left 10/18/2015   Procedure: OPEN REDUCTION INTERNAL FIXATION (ORIF) LEFT TIBIAL PLATEAU;  Surgeon: Ozell Bruch, MD;  Location: [facility] OR;  Service: Orthopedics;  Laterality: Left;    SOCIAL HISTORY: Social History   Socioeconomic History   Marital status: Married    Spouse name: Not on file   Number of children: Not on file   Years of education: Not on file   Highest education level: Not on file  Occupational History   Not on file  Tobacco Use   Smoking status: Every Day    Current packs/day: 0.50  Average packs/day: 0.5 packs/day for 10.0 years (5.0 ttl pk-yrs)    Types: Cigarettes    Passive exposure: Current   Smokeless tobacco: Never  Vaping Use   Vaping status: Never Used  Substance and Sexual Activity   Alcohol use: Yes    Comment: 10/18/2015 down to maybe 1 beer q 2 weeks or so   Drug use: Yes    Types: Marijuana    Comment: 10/18/2015 quit cocaine recently   Sexual activity: Yes    Birth control/protection: Implant  Other Topics Concern   Not on file  Social History Narrative   Not on file   Social Drivers of Health   Financial Resource Strain:  Not on File (03/26/2022)   Received from [employer]    Financial Resource Strain: 0  Food Insecurity: Not on File (03/26/2022)   Received from [facility] Insecurity    Food: 0  Transportation Needs: Not on File (03/26/2022)   Received from [employer] Needs    Transportation: 0  Physical Activity: Not on File (03/26/2022)   Received from [facility]   Physical Activity    Physical Activity: 0  Stress: Not on File (03/26/2022)   Received from [facility]   Stress    Stress: 0  Social Connections: Not on File (03/24/2023)   Received from [facility] [facility]   Social Connections    Connectedness: 0  Intimate Partner Violence: Not on file    FAMILY HISTORY: Family History  Problem Relation Age of Onset   Clotting disorder Mother    Heart Problems Father    Clotting disorder Maternal Uncle     ALLERGIES:  is allergic to amoxicillin .  MEDICATIONS:  Current Outpatient Medications  Medication Sig Dispense Refill   cetirizine  (ZYRTEC ) 10 MG tablet TAKE 1 TABLET(10 MG) BY MOUTH DAILY 90 tablet 0   ferrous sulfate  325 (65 FE) MG EC tablet TAKE 1 TABLET(325 MG) BY MOUTH DAILY WITH BREAKFAST 90 tablet 3   ferrous sulfate  325 (65 FE) MG tablet Take 1 tablet (325 mg total) by mouth daily with breakfast. Please take with a source of Vitamin C  90 tablet 3   fluticasone  (FLONASE ) 50 MCG/ACT nasal spray SHAKE LIQUID AND USE 2 SPRAYS IN EACH NOSTRIL DAILY 48 g 0   folic acid  (FOLVITE ) 1 MG tablet TAKE 1 TABLET(1 MG) BY MOUTH DAILY 30 tablet 3   ibuprofen  (ADVIL ) 200 MG tablet Take 200 mg by mouth every 6 (six) hours as needed.     methimazole  (TAPAZOLE ) 5 MG tablet TAKE 1 TABLET BY MOUTH EVERY DAY(MUST MAKE APPOINTMENT WITH ENDOCRINE DOCTOR) 30 tablet 0   ondansetron  (ZOFRAN -ODT) 4 MG disintegrating tablet Take 1 tablet (4 mg total) by mouth every 8 (eight) hours as needed. 20 tablet 0   Current Facility-Administered Medications  Medication Dose Route Frequency Provider Last Rate  Last Admin   medroxyPROGESTERone  (DEPO-PROVERA ) injection 150 mg  150 mg Intramuscular Once Sowell, Brandon, MD       medroxyPROGESTERone  Acetate SUSY 150 mg  150 mg Intramuscular Once Sowell, Brandon, MD        REVIEW OF SYSTEMS:   Constitutional: ( - ) fevers, ( - )  chills , ( - ) night sweats Eyes: ( - ) blurriness of vision, ( - ) double vision, ( - ) watery eyes Ears, nose, mouth, throat, and face: ( - ) mucositis, ( - ) sore throat Respiratory: ( - ) cough, ( - ) dyspnea, ( - )  wheezes Cardiovascular: ( - ) palpitation, ( - ) chest discomfort, ( - ) lower extremity swelling Gastrointestinal:  ( - ) nausea, ( - ) heartburn, ( - ) change in bowel habits Skin: ( - ) abnormal skin rashes Lymphatics: ( - ) new lymphadenopathy, ( - ) easy bruising Neurological: ( - ) numbness, ( - ) tingling, ( - ) new weaknesses Behavioral/Psych: ( - ) mood change, ( - ) new changes  All other systems were reviewed with the patient and are negative.  PHYSICAL EXAMINATION: ECOG PERFORMANCE STATUS: 0 - Asymptomatic  There were no vitals filed for this visit.   There were no vitals filed for this visit.    GENERAL: well appearing young African American female. alert, no distress and comfortable SKIN: skin color, texture, turgor are normal, no rashes or significant lesions EYES: conjunctiva are pink and non-injected, sclera clear LUNGS: clear to auscultation and percussion with normal breathing effort HEART: regular rate & rhythm and no murmurs and no lower extremity edema Musculoskeletal: no cyanosis of digits and no clubbing  PSYCH: alert & oriented x 3, fluent speech NEURO: no focal motor/sensory deficits  LABORATORY DATA:  I have reviewed the data as listed    Latest Ref Rng & Units 02/07/2024    3:21 PM 11/08/2023    3:31 PM 08/23/2023    3:06 PM  CBC  WBC 4.0 - 10.5 K/uL 10.9  10.6  10.7   Hemoglobin 12.0 - 15.0 g/dL 85.8  86.4  86.1   Hematocrit 36.0 - 46.0 % 41.2  39.9  41.3    Platelets 150 - 400 K/uL 357  348  370        Latest Ref Rng & Units 02/07/2024    3:21 PM 11/08/2023    3:31 PM 08/23/2023    3:06 PM  CMP  Glucose 70 - 99 mg/dL 92  76  890   BUN 6 - 20 mg/dL 6  5  7    Creatinine 0.44 - 1.00 mg/dL 9.20  9.25  9.28   Sodium 135 - 145 mmol/L 138  137  137   Potassium 3.5 - 5.1 mmol/L 3.7  3.8  3.7   Chloride 98 - 111 mmol/L 112  107  108   CO2 22 - 32 mmol/L 20  24  22    Calcium  8.9 - 10.3 mg/dL 8.6  9.1  9.0   Total Protein 6.5 - 8.1 g/dL 7.3  7.6  7.7   Total Bilirubin 0.0 - 1.2 mg/dL 0.3  0.3  0.3   Alkaline Phos 38 - 126 U/L 68  68  70   AST 15 - 41 U/L 9  11  10    ALT 0 - 44 U/L 6  8  6      No results found for: MPROTEIN No results found for: KPAFRELGTCHN, LAMBDASER, KAPLAMBRATIO   RADIOGRAPHIC STUDIES: No results found.  ASSESSMENT & PLAN Anderson Coppock Loken 38 y.o. female with medical history significant for iron deficiency anemia presents for a follow up visit.   #Iron Deficiency Anemia 2/2 to GYN Blood loss --Labs today show anemia with Hgb 13.1, WBC 9.4, Plt 353, Ferritin pending. --continue PO ferrous sulfate  325 mg PO daily with a source of vitamin C .  --patient had Nexplanon  removed due to near continuous cycle.  Not currently on birth control. --No indication for IV iron at this time.   #Vitamin B12/Folate deficiency: --currently on folic acid  1 mg once daily --On 11/03/21, received B12 injection  weekly x 4 then transitioned to monthly injections.  --Recommend to continue with monthly injections.   # Thrombocytosis, chronic.  # Leukocytosis --leukocytosis resolved, etiology unclear. MPN workup with BCR/ABL FISH and JAK2 w/ reflex was negative.  -- Patient is an active smoker, most likely etiology for her current leukocytosis.  Follow up: --Continue with monthly b12 injections  --RTC in 6 months with labs  No orders of the defined types were placed in this encounter.  All questions were answered. The patient knows  to call the clinic with any problems, questions or concerns.  I have spent a total of 30 minutes minutes of face-to-face and non-face-to-face time, preparing to see the patient, performing a medically appropriate examination, counseling and educating the patient, ordering medications/tests, documenting clinical information in the electronic health record, and care coordination.   Norleen IVAR Kidney, MD Department of Hematology/Oncology [facility] Phone: [phone] Pager: [phone] Email: [email or web address]  05/01/2024 12:35 PM

## 2024-05-09 LAB — METHYLMALONIC ACID, SERUM: Methylmalonic Acid, Quantitative: 95 nmol/L (ref 0–378)

## 2024-05-10 ENCOUNTER — Encounter: Payer: Self-pay | Admitting: Hematology and Oncology

## 2024-05-13 ENCOUNTER — Other Ambulatory Visit: Payer: Self-pay

## 2024-05-13 DIAGNOSIS — E0590 Thyrotoxicosis, unspecified without thyrotoxic crisis or storm: Secondary | ICD-10-CM

## 2024-05-27 ENCOUNTER — Other Ambulatory Visit

## 2024-05-27 ENCOUNTER — Encounter: Payer: Self-pay | Admitting: Endocrinology

## 2024-05-27 ENCOUNTER — Ambulatory Visit (INDEPENDENT_AMBULATORY_CARE_PROVIDER_SITE_OTHER): Admitting: Endocrinology

## 2024-05-27 VITALS — BP 98/70 | HR 107 | Resp 20 | Ht 71.0 in | Wt 219.6 lb

## 2024-05-27 DIAGNOSIS — E0590 Thyrotoxicosis, unspecified without thyrotoxic crisis or storm: Secondary | ICD-10-CM | POA: Diagnosis not present

## 2024-05-27 LAB — TSH: TSH: 3.65 m[IU]/L

## 2024-05-27 LAB — T3, FREE: T3, Free: 3.3 pg/mL (ref 2.3–4.2)

## 2024-05-27 LAB — T4, FREE: Free T4: 1.1 ng/dL (ref 0.8–1.8)

## 2024-05-27 NOTE — Progress Notes (Signed)
 Outpatient Endocrinology Note Ellen Kjos, MD   Patient's Name: Ellen Stone    DOB: October 27, 1985    MRN: [id number]  REASON OF VISIT: New consult for hyperthyroidism  REFERRING PROVIDER: Madelon Donald HERO, DO  PCP: Lupie Credit, DO  HISTORY OF PRESENT ILLNESS:   Ellen Stone is a 38 y.o. old female with past medical history as listed below is presented for a follow up new consult for hyperthyroidism.   Pertinent Thyroid  History: Patient is referred to endocrinology for evaluation and management of hyperthyroidism.  Initial consult on May 27, 2024.  Patient was diagnosed with hyperthyroidism in February 2023 when she had mildly low TSH of 0.348 with normal T4, initially started on methimazole  5 mg 2 times a day and was later decreased to 5 mg daily in 2023.  She has been taking methimazole  5 mg daily since then. She has thyroid  autoantibodies TRAb and TSI negative in 07/06/24negative for Graves' disease.  Thyroid  function test was being monitored periodically with mostly normal TSH and occasionally low free T4 and free T3.  No thyroid  ultrasound or imaging available to review.  Patient was taking methimazole  5 mg daily until recently, she ran out and has not been taking for last 4 days.  Patient denies palpitation or heat intolerance.  Overall in her usual state of health.  She reports methimazole  is helping to regularize her menstrual cycle/ periods.  Denies cold intolerance or constipation.  No neck discomfort or any neck compressive symptoms.  Family history of thyroid  disorder.  Labs:  Latest Reference Range & Units 01/11/23 09:19  TSI <140 % baseline <89  TRAB <=2.00 IU/L <1.00    Latest Reference Range & Units 09/17/19 16:06 10/22/19 15:39 08/24/21 16:05 12/05/21 15:07  TSH 0.450 - 4.500 uIU/mL 0.348 (L) 0.757 0.326 (L) 2.420  Triiodothyronine,Free,Serum 2.0 - 4.4 pg/mL    2.0  T4,Free(Direct) 0.82 - 1.77 ng/dL    9.20 (L)  Thyroxine (T4) 4.5 - 12.0 ug/dL  5.3  4.5   Free Thyroxine Index 1.2 - 4.9   1.4 1.2   T3 Uptake Ratio 24 - 39 %  26 26   (L): Data is abnormally low  Interval history  Initial visit today.  Patient was taking methimazole  5 mg daily until recently, ran out of it recently and has not taken for last 4 days.    She has no plan for pregnancy.  REVIEW OF SYSTEMS:  As per history of present illness.   PAST MEDICAL HISTORY: Past Medical History:  Diagnosis Date   Chronic bronchitis (HCC)    Cocaine abuse (HCC)    History of trichomoniasis 01/27/2020   Hyperphosphatemia 10/20/2015   Hypertension    Low TSH level 09/29/2019   Recurrent UTI (urinary tract infection)    none lately; since I started drinking more water (10/18/2015)   Transaminitis 01/26/2021   Vitamin D  deficiency 10/20/2015    PAST SURGICAL HISTORY: Past Surgical History:  Procedure Laterality Date   FRACTURE SURGERY     ORIF PROXIMAL TIBIAL PLATEAU FRACTURE Left 10/18/2015   ORIF TIBIA PLATEAU Left 10/18/2015   Procedure: OPEN REDUCTION INTERNAL FIXATION (ORIF) LEFT TIBIAL PLATEAU;  Surgeon: Ozell Bruch, MD;  Location: [facility] OR;  Service: Orthopedics;  Laterality: Left;    ALLERGIES: Allergies  Allergen Reactions   Amoxicillin  Nausea And Vomiting    N/v, dizziness    FAMILY HISTORY:  Family History  Problem Relation Age of Onset   Clotting disorder Mother  Heart Problems Father    Clotting disorder Maternal Uncle     SOCIAL HISTORY: Social History   Socioeconomic History   Marital status: Married    Spouse name: Not on file   Number of children: Not on file   Years of education: Not on file   Highest education level: Not on file  Occupational History   Not on file  Tobacco Use   Smoking status: Every Day    Current packs/day: 0.50    Average packs/day: 0.5 packs/day for 10.0 years (5.0 ttl pk-yrs)    Types: Cigarettes    Passive exposure: Current   Smokeless tobacco: Never  Vaping Use   Vaping status: Never Used  Substance and Sexual  Activity   Alcohol use: Yes    Comment: 10/18/2015 down to maybe 1 beer q 2 weeks or so   Drug use: Yes    Types: Marijuana    Comment: 10/18/2015 quit cocaine recently   Sexual activity: Yes    Birth control/protection: Implant  Other Topics Concern   Not on file  Social History Narrative   Not on file   Social Drivers of Health   Financial Resource Strain: Not on File (03/26/2022)   Received from [employer]    Financial Resource Strain: 0  Food Insecurity: Not on File (03/26/2022)   Received from [facility] Insecurity    Food: 0  Transportation Needs: Not on File (03/26/2022)   Received from [employer] Needs    Transportation: 0  Physical Activity: Not on File (03/26/2022)   Received from [facility]   Physical Activity    Physical Activity: 0  Stress: Not on File (03/26/2022)   Received from [facility]   Stress    Stress: 0  Social Connections: Not on File (03/24/2023)   Received from [facility]   Social Connections    Connectedness: 0    MEDICATIONS:  Current Outpatient Medications  Medication Sig Dispense Refill   cetirizine  (ZYRTEC ) 10 MG tablet TAKE 1 TABLET(10 MG) BY MOUTH DAILY 90 tablet 0   ferrous sulfate  325 (65 FE) MG EC tablet TAKE 1 TABLET(325 MG) BY MOUTH DAILY WITH BREAKFAST 90 tablet 3   ferrous sulfate  325 (65 FE) MG tablet Take 1 tablet (325 mg total) by mouth daily with breakfast. Please take with a source of Vitamin C  90 tablet 3   fluticasone  (FLONASE ) 50 MCG/ACT nasal spray SHAKE LIQUID AND USE 2 SPRAYS IN EACH NOSTRIL DAILY 48 g 0   folic acid  (FOLVITE ) 1 MG tablet TAKE 1 TABLET(1 MG) BY MOUTH DAILY 30 tablet 3   ibuprofen  (ADVIL ) 200 MG tablet Take 200 mg by mouth every 6 (six) hours as needed.     methimazole  (TAPAZOLE ) 5 MG tablet TAKE 1 TABLET BY MOUTH EVERY DAY(MUST MAKE APPOINTMENT WITH ENDOCRINE DOCTOR) 15 tablet 0   Current Facility-Administered Medications  Medication Dose Route Frequency Provider Last Rate Last  Admin   medroxyPROGESTERone  (DEPO-PROVERA ) injection 150 mg  150 mg Intramuscular Once Sowell, Brandon, MD       medroxyPROGESTERone  Acetate SUSY 150 mg  150 mg Intramuscular Once Sowell, Brandon, MD        PHYSICAL EXAM: Vitals:   05/27/24 0809  BP: 98/70  Pulse: (!) 107  Resp: 20  SpO2: 97%  Weight: 219 lb 9.6 oz (99.6 kg)  Height: 5' 11 (1.803 m)   Body mass index is 30.63 kg/m.  Wt Readings from Last 3 Encounters:  05/27/24 219 lb 9.6 oz (99.6 kg)  05/01/24 222 lb 3.2 oz (100.8 kg)  11/15/23 210 lb 9.6 oz (95.5 kg)    General: Well developed, well nourished female in no apparent distress.  HEENT: AT/[place], no external lesions. Hearing intact to the spoken word Eyes: EOMI. No stare, proptosis or lid lag. Conjunctiva clear and no icterus. No erythema or watering Neck: Trachea midline, neck supple without appreciable thyromegaly or lymphadenopathy and no palpable thyroid  nodules Lungs: Clear to auscultation, no wheeze. Respirations not labored Heart: S1S2, Regular in rate and rhythm. Abdomen: Soft, non tender Neurologic: Alert, oriented, normal speech, deep tendon biceps reflexes normal,  no gross focal neurological deficit Extremities: No pedal pitting edema, no tremors of outstretched hands Skin: Warm, color good. Psychiatric: Does not appear depressed or anxious  PERTINENT HISTORIC LABORATORY AND IMAGING STUDIES:  All pertinent laboratory results were reviewed. Please see HPI also for further details.   TSH  Date Value Ref Range Status  01/11/2023 1.52 0.35 - 5.50 uIU/mL Final  10/18/2022 2.360 0.450 - 4.500 uIU/mL Final  12/05/2021 2.420 0.450 - 4.500 uIU/mL Final    Lab Results  Component Value Date   FREET4 0.71 04/05/2023   FREET4 0.57 (L) 01/11/2023   FREET4 0.79 (L) 12/05/2021   T3FREE 3.0 04/05/2023   T3FREE 2.2 (L) 01/11/2023   T3FREE 2.0 12/05/2021   TSH 1.52 01/11/2023   TSH 2.360 10/18/2022   TSH 2.420 12/05/2021    No results found for:  THYROTRECAB  Lab Results  Component Value Date   TSH 1.52 01/11/2023   TSH 2.360 10/18/2022   TSH 2.420 12/05/2021   FREET4 0.71 04/05/2023   FREET4 0.57 (L) 01/11/2023   FREET4 0.79 (L) 12/05/2021     Lab Results  Component Value Date   TSI <89 01/11/2023     No components found for: TRAB    ASSESSMENT / PLAN  1. Hyperthyroidism    Patient was diagnosed with hyperthyroidism with mildly low TSH and normal T4, in the beginning of 2023, she has been taking methimazole  since then, lately has been taking 5 mg daily.  She had negative TRAb and TSI, negative for Graves' disease.  Etiology of hyperthyroidism is unclear.  When it was diagnosed it may have been also related to having thyroiditis, she had chronic sinusitis at that time.  Other cause for hyperthyroidism can be hyperfunctioning thyroid  nodule.  Patient was taking methimazole  5 mg daily, has not taken for last 4 days.  Plan: - I would like to check thyroid  function test. - Will resume methimazole  based on test results today.  Consider tapering off of methimazole . - If she remains hyperthyroid we will plan for ultrasound thyroid .   Diagnoses and all orders for this visit:  Hyperthyroidism -     T4, free -     TSH -     T3, free    DISPOSITION Follow up in clinic in 2 months suggested.  All questions answered and patient verbalized understanding of the plan.  Jaycie Kregel, MD [facility] End[facility]ology [facility] [address] [place], [address] [place], [place] [address] Phone # [phone]   At least part of this note was generated using voice recognition software. Inadvertent word errors may have occurred, which were not recognized during the proofreading process.

## 2024-05-28 ENCOUNTER — Ambulatory Visit: Payer: Self-pay | Admitting: Endocrinology

## 2024-05-28 DIAGNOSIS — E0590 Thyrotoxicosis, unspecified without thyrotoxic crisis or storm: Secondary | ICD-10-CM

## 2024-05-29 ENCOUNTER — Inpatient Hospital Stay: Attending: Hematology and Oncology

## 2024-05-29 DIAGNOSIS — E538 Deficiency of other specified B group vitamins: Secondary | ICD-10-CM | POA: Diagnosis present

## 2024-05-29 DIAGNOSIS — N920 Excessive and frequent menstruation with regular cycle: Secondary | ICD-10-CM | POA: Insufficient documentation

## 2024-05-29 DIAGNOSIS — D500 Iron deficiency anemia secondary to blood loss (chronic): Secondary | ICD-10-CM | POA: Insufficient documentation

## 2024-05-29 MED ORDER — CYANOCOBALAMIN 1000 MCG/ML IJ SOLN
1000.0000 ug | Freq: Once | INTRAMUSCULAR | Status: AC
  Administered 2024-05-29: 1000 ug via INTRAMUSCULAR
  Filled 2024-05-29: qty 1

## 2024-06-26 ENCOUNTER — Ambulatory Visit

## 2024-06-26 ENCOUNTER — Encounter (INDEPENDENT_AMBULATORY_CARE_PROVIDER_SITE_OTHER): Payer: Self-pay

## 2024-06-26 VITALS — BP 98/76 | HR 96 | Ht 71.0 in | Wt 225.0 lb

## 2024-06-26 DIAGNOSIS — Z760 Encounter for issue of repeat prescription: Secondary | ICD-10-CM

## 2024-06-26 DIAGNOSIS — D2322 Other benign neoplasm of skin of left ear and external auricular canal: Secondary | ICD-10-CM

## 2024-06-26 MED ORDER — FOLIC ACID 1 MG PO TABS: 1.0000 mg | ORAL_TABLET | Freq: Every day | ORAL | 3 refills | Status: AC

## 2024-06-26 MED ORDER — FERROUS SULFATE 325 (65 FE) MG PO TABS: 325.0000 mg | ORAL_TABLET | Freq: Every day | ORAL | 3 refills | Status: AC

## 2024-06-26 MED ORDER — POLYETHYLENE GLYCOL 3350 17 GM/SCOOP PO POWD: 17.0000 g | Freq: Two times a day (BID) | ORAL | 1 refills | Status: AC | PRN

## 2024-06-26 NOTE — Progress Notes (Signed)
° ° °  SUBJECTIVE:   CHIEF COMPLAINT / HPI:   Ellen Stone is a 38yo F who presents to the clinic with primary complaint of L ear cyst x 2 months.  Patient states this cyst is spherical and mobile and comes and goes every few weeks.  It does not drain.  But she does report a similar cyst on the right lobe which does have a punctum and drains pus.  No fevers, or any other systemic symptoms.  She would like this excised.  She also requests medication refills for iron and folate.  OBJECTIVE:   BP 98/76   Pulse 96   Ht 5' 11 (1.803 m)   Wt 225 lb (102.1 kg)   LMP 06/07/2024   SpO2 99%   BMI 31.38 kg/m   General: A&O, NAD HEENT: No sign of trauma, EOM grossly intact Respiratory: normal WOB GI: non-distended  Extremities: no peripheral edema. Neuro: Normal gait, moves all four extremities appropriately Skin: no rashes visualized, right earlobe with posterior punctum and hyperpigmentation, no cyst at this time; left earlobe with one 1 x 1 cm spherical and very mobile cystic lesion to the lateral aspect without tenderness or overlying skin changes or punctum Psych: Appropriate mood and affect   ASSESSMENT/PLAN:   Assessment & Plan Dermoid cyst of left ear Most likely cyst that needs to be removed with the sac to prevent recurrence.  Due to sensitive area, high risk of scar tissue and hyperpigmentation formation, and proximity to cartilage, recommend that excision is done by ENT or plastics. -Referral to ENT sent. -Follow-up with me as needed. Medication refill With history of severe iron deficiency anemia followed by heme-onc.  Concern for constipation with need for daily iron.  Currently takes Colace but not very helpful. -Refill sent in for daily iron and folic acid . -Start MiraLAX  once a day for 1 week, then increase to twice a day if no significant improvement. -Discussed importance of regular bowel movements and risks of constipation especially with her medical history and need to take daily  iron supplements.  Follow-up with me through MyChart about constipation.  Ellen Dixons, DO [facility]

## 2024-07-03 ENCOUNTER — Inpatient Hospital Stay: Attending: Hematology and Oncology

## 2024-07-03 DIAGNOSIS — E538 Deficiency of other specified B group vitamins: Secondary | ICD-10-CM | POA: Insufficient documentation

## 2024-07-03 DIAGNOSIS — D500 Iron deficiency anemia secondary to blood loss (chronic): Secondary | ICD-10-CM | POA: Diagnosis present

## 2024-07-03 DIAGNOSIS — N920 Excessive and frequent menstruation with regular cycle: Secondary | ICD-10-CM | POA: Insufficient documentation

## 2024-07-03 MED ORDER — CYANOCOBALAMIN 1000 MCG/ML IJ SOLN
1000.0000 ug | Freq: Once | INTRAMUSCULAR | Status: AC
  Administered 2024-07-03: 1000 ug via INTRAMUSCULAR
  Filled 2024-07-03: qty 1

## 2024-07-27 ENCOUNTER — Other Ambulatory Visit

## 2024-07-28 LAB — TSH: TSH: 0.95 m[IU]/L

## 2024-07-28 LAB — T3, FREE: T3, Free: 3 pg/mL (ref 2.3–4.2)

## 2024-07-28 LAB — T4, FREE: Free T4: 1.1 ng/dL (ref 0.8–1.8)

## 2024-07-31 ENCOUNTER — Inpatient Hospital Stay: Attending: Hematology and Oncology

## 2024-07-31 ENCOUNTER — Encounter: Payer: Self-pay | Admitting: Endocrinology

## 2024-07-31 ENCOUNTER — Inpatient Hospital Stay

## 2024-07-31 ENCOUNTER — Other Ambulatory Visit: Payer: Self-pay | Admitting: Hematology and Oncology

## 2024-07-31 ENCOUNTER — Ambulatory Visit: Admitting: Endocrinology

## 2024-07-31 VITALS — BP 120/76 | HR 76 | Resp 16 | Ht 71.0 in | Wt 231.4 lb

## 2024-07-31 DIAGNOSIS — Z8639 Personal history of other endocrine, nutritional and metabolic disease: Secondary | ICD-10-CM

## 2024-07-31 DIAGNOSIS — D500 Iron deficiency anemia secondary to blood loss (chronic): Secondary | ICD-10-CM | POA: Insufficient documentation

## 2024-07-31 DIAGNOSIS — E538 Deficiency of other specified B group vitamins: Secondary | ICD-10-CM | POA: Insufficient documentation

## 2024-07-31 DIAGNOSIS — N920 Excessive and frequent menstruation with regular cycle: Secondary | ICD-10-CM | POA: Diagnosis present

## 2024-07-31 DIAGNOSIS — R7989 Other specified abnormal findings of blood chemistry: Secondary | ICD-10-CM | POA: Diagnosis not present

## 2024-07-31 LAB — CBC WITH DIFFERENTIAL (CANCER CENTER ONLY)
Abs Immature Granulocytes: 0.04 K/uL (ref 0.00–0.07)
Basophils Absolute: 0.1 K/uL (ref 0.0–0.1)
Basophils Relative: 1 %
Eosinophils Absolute: 0.2 K/uL (ref 0.0–0.5)
Eosinophils Relative: 2 %
HCT: 40.2 % (ref 36.0–46.0)
Hemoglobin: 13.8 g/dL (ref 12.0–15.0)
Immature Granulocytes: 0 %
Lymphocytes Relative: 21 %
Lymphs Abs: 2.2 K/uL (ref 0.7–4.0)
MCH: 29.7 pg (ref 26.0–34.0)
MCHC: 34.3 g/dL (ref 30.0–36.0)
MCV: 86.6 fL (ref 80.0–100.0)
Monocytes Absolute: 0.8 K/uL (ref 0.1–1.0)
Monocytes Relative: 7 %
Neutro Abs: 7.3 K/uL (ref 1.7–7.7)
Neutrophils Relative %: 69 %
Platelet Count: 289 K/uL (ref 150–400)
RBC: 4.64 MIL/uL (ref 3.87–5.11)
RDW: 13.6 % (ref 11.5–15.5)
WBC Count: 10.5 K/uL (ref 4.0–10.5)
nRBC: 0 % (ref 0.0–0.2)

## 2024-07-31 LAB — CMP (CANCER CENTER ONLY)
ALT: 11 U/L (ref 0–44)
AST: 18 U/L (ref 15–41)
Albumin: 4.1 g/dL (ref 3.5–5.0)
Alkaline Phosphatase: 74 U/L (ref 38–126)
Anion gap: 10 (ref 5–15)
BUN: 7 mg/dL (ref 6–20)
CO2: 24 mmol/L (ref 22–32)
Calcium: 8.8 mg/dL — ABNORMAL LOW (ref 8.9–10.3)
Chloride: 105 mmol/L (ref 98–111)
Creatinine: 0.85 mg/dL (ref 0.44–1.00)
GFR, Estimated: >60 mL/min
Glucose, Bld: 75 mg/dL (ref 70–99)
Potassium: 4.2 mmol/L (ref 3.5–5.1)
Sodium: 138 mmol/L (ref 135–145)
Total Bilirubin: <0.2 mg/dL (ref 0.0–1.2)
Total Protein: 6.8 g/dL (ref 6.5–8.1)

## 2024-07-31 LAB — IRON AND IRON BINDING CAPACITY (CC-WL,HP ONLY)
Iron: 107 ug/dL (ref 28–170)
Saturation Ratios: 33 % — ABNORMAL HIGH (ref 10.4–31.8)
TIBC: 329 ug/dL (ref 250–450)
UIBC: 222 ug/dL

## 2024-07-31 LAB — RETIC PANEL
Immature Retic Fract: 8.1 % (ref 2.3–15.9)
RBC.: 4.59 MIL/uL (ref 3.87–5.11)
Retic Count, Absolute: 86.3 K/uL (ref 19.0–186.0)
Retic Ct Pct: 1.9 % (ref 0.4–3.1)
Reticulocyte Hemoglobin: 32.3 pg

## 2024-07-31 LAB — FERRITIN: Ferritin: 91 ng/mL (ref 11–307)

## 2024-07-31 LAB — VITAMIN B12: Vitamin B-12: 572 pg/mL (ref 180–914)

## 2024-07-31 MED ORDER — CYANOCOBALAMIN 1000 MCG/ML IJ SOLN
1000.0000 ug | Freq: Once | INTRAMUSCULAR | Status: AC
  Administered 2024-07-31: 1000 ug via INTRAMUSCULAR
  Filled 2024-07-31: qty 1

## 2024-07-31 NOTE — Progress Notes (Signed)
 "  Outpatient Endocrinology Note Bauer Ausborn, MD   Patient's Name: Ellen Stone    DOB: Jan 26, 1986    MRN: [id number]  REASON OF VISIT: Follow-up for hyperthyroidism  REFERRING PROVIDER: Madelon Donald HERO, DO  PCP: Lupie Credit, DO  HISTORY OF PRESENT ILLNESS:   Ellen Stone is a 39 y.o. old female with past medical history as listed below is presented for a follow up for hyperthyroidism.   Pertinent Thyroid  History: Patient was referred to endocrinology for evaluation and management of hyperthyroidism.  Initial consult on May 27, 2024.  Background: - Patient was diagnosed with hyperthyroidism in February 2023 when she had mildly low TSH of 0.348 with normal T4, initially started on methimazole  5 mg 2 times a day and was later decreased to 5 mg daily in 2023.  She was taking methimazole  5 mg daily since then. She had thyroid  autoantibodies TRAb and TSI negative in 07-02-2024negative for Graves' disease.  Thyroid  function test was being monitored periodically with mostly normal TSH and occasionally low free T4 and free T3.  No thyroid  ultrasound available to review.  CT neck was normal thyroid  in 2022.  Patient was taking methimazole  5 mg daily until few days prior to initial visit in November, she ran out and was not taking.  On annual visit, patient denies palpitation or heat intolerance.  Overall in her usual state of health.  She reports methimazole  is helping to regularize her menstrual cycle / periods.  Denies cold intolerance or constipation. No neck discomfort or any neck compressive symptoms.  Denies family history of thyroid  disorder.  Labs:  Latest Reference Range & Units 01/11/23 09:19  TSI <140 % baseline <89  TRAB <=2.00 IU/L <1.00   On initial visit in November 2025 patient had normal thyroid  function test.  Methimazole  was not resumed.  Interval history Patient presents for follow-up.  Methimazole  was not resumed in November.  She has not been taking  methimazole  at this time.  Patient denies palpitation, heat intolerance.  Patient has noticed heavy menstrual bleeding, she thinks is related to not taking methimazole .  Discussed that at this time she does not have active thyroid  disorder unlikely to have relation of vaginal bleeding and methimazole .    Patient thyroid  function test normal as follows.   Latest Reference Range & Units 07/27/24 13:46  TSH mIU/L 0.95  Triiodothyronine,Free,Serum 2.3 - 4.2 pg/mL 3.0  T4,Free(Direct) 0.8 - 1.8 ng/dL 1.1    REVIEW OF SYSTEMS:  As per history of present illness.   PAST MEDICAL HISTORY: Past Medical History:  Diagnosis Date   Chronic bronchitis (HCC)    Cocaine abuse (HCC)    History of trichomoniasis 01/27/2020   Hyperphosphatemia 10/20/2015   Hypertension    Low TSH level 09/29/2019   Recurrent UTI (urinary tract infection)    none lately; since I started drinking more water (10/18/2015)   Transaminitis 01/26/2021   Vitamin D  deficiency 10/20/2015    PAST SURGICAL HISTORY: Past Surgical History:  Procedure Laterality Date   FRACTURE SURGERY     ORIF PROXIMAL TIBIAL PLATEAU FRACTURE Left 10/18/2015   ORIF TIBIA PLATEAU Left 10/18/2015   Procedure: OPEN REDUCTION INTERNAL FIXATION (ORIF) LEFT TIBIAL PLATEAU;  Surgeon: Ozell Bruch, MD;  Location: [facility] OR;  Service: Orthopedics;  Laterality: Left;    ALLERGIES: Allergies  Allergen Reactions   Amoxicillin  Nausea And Vomiting    N/v, dizziness    FAMILY HISTORY:  Family History  Problem Relation Age of Onset  Clotting disorder Mother    Heart Problems Father    Clotting disorder Maternal Uncle     SOCIAL HISTORY: Social History   Socioeconomic History   Marital status: Married    Spouse name: Not on file   Number of children: Not on file   Years of education: Not on file   Highest education level: Not on file  Occupational History   Not on file  Tobacco Use   Smoking status: Every Day    Current packs/day: 0.50     Average packs/day: 0.5 packs/day for 10.0 years (5.0 ttl pk-yrs)    Types: Cigarettes    Passive exposure: Current   Smokeless tobacco: Never  Vaping Use   Vaping status: Never Used  Substance and Sexual Activity   Alcohol use: Yes    Comment: 10/18/2015 down to maybe 1 beer q 2 weeks or so   Drug use: Yes    Types: Marijuana    Comment: 10/18/2015 quit cocaine recently   Sexual activity: Yes    Birth control/protection: Implant  Other Topics Concern   Not on file  Social History Narrative   Not on file   Social Drivers of Health   Tobacco Use: High Risk (07/31/2024)   Patient History    Smoking Tobacco Use: Every Day    Smokeless Tobacco Use: Never    Passive Exposure: Current  Financial Resource Strain: Not on File (03/26/2022)   Received from [facility]    Financial Resource Strain: 0  Food Insecurity: Not on File (03/26/2022)   Received from [facility] Insecurity    Food: 0  Transportation Needs: Not on File (03/26/2022)   Received from [facility] Needs    Transportation: 0  Physical Activity: Not on File (03/26/2022)   Received from [facility]   Physical Activity    Physical Activity: 0  Stress: Not on File (03/26/2022)   Received from [facility]   Stress    Stress: 0  Social Connections: Not on File (03/24/2023)   Received from [facility]   Social Connections    Connectedness: 0  Depression (PHQ2-9): Low Risk (06/26/2024)   Depression (PHQ2-9)    PHQ-2 Score: 4  Alcohol Screen: Not on file  Housing: Not on file  Utilities: Not on file  Health Literacy: Not on file    MEDICATIONS:  Current Outpatient Medications  Medication Sig Dispense Refill   cetirizine  (ZYRTEC ) 10 MG tablet TAKE 1 TABLET(10 MG) BY MOUTH DAILY 90 tablet 0   ferrous sulfate  325 (65 FE) MG tablet Take 1 tablet (325 mg total) by mouth daily with breakfast. 30 tablet 3   fluticasone  (FLONASE ) 50 MCG/ACT nasal spray SHAKE LIQUID AND USE 2 SPRAYS IN EACH NOSTRIL DAILY 48  g 0   folic acid  (FOLVITE ) 1 MG tablet Take 1 tablet (1 mg total) by mouth daily. 30 tablet 3   ibuprofen  (ADVIL ) 200 MG tablet Take 200 mg by mouth every 6 (six) hours as needed.     polyethylene glycol powder (GLYCOLAX /MIRALAX ) 17 GM/SCOOP powder Take 17 g by mouth 2 (two) times daily as needed. Dissolve 1 capful (17g) in 4-8 ounces of liquid and take by mouth daily. 3350 g 1   Current Facility-Administered Medications  Medication Dose Route Frequency Provider Last Rate Last Admin   medroxyPROGESTERone  (DEPO-PROVERA ) injection 150 mg  150 mg Intramuscular Once Sowell, Brandon, MD       medroxyPROGESTERone  Acetate SUSY 150 mg  150 mg  Intramuscular Once Jennelle Riis, MD        PHYSICAL EXAM: Vitals:   07/31/24 1119  BP: 120/76  Pulse: 76  Resp: 16  SpO2: 97%  Weight: 231 lb 6.4 oz (105 kg)  Height: 5' 11 (1.803 m)    Body mass index is 32.27 kg/m.  Wt Readings from Last 3 Encounters:  07/31/24 231 lb 6.4 oz (105 kg)  06/26/24 225 lb (102.1 kg)  05/27/24 219 lb 9.6 oz (99.6 kg)    General: Well developed, well nourished female in no apparent distress.  HEENT: AT/[place], no external lesions. Hearing intact to the spoken word Eyes: EOMI. No stare, proptosis or lid lag. Conjunctiva clear and no icterus. No erythema or watering Neck: Trachea midline, neck supple without appreciable thyromegaly or lymphadenopathy and no palpable thyroid  nodules Lungs: Clear to auscultation, no wheeze. Respirations not labored Heart: S1S2, Regular in rate and rhythm. Abdomen: Soft, non tender Neurologic: Alert, oriented, normal speech, deep tendon biceps reflexes normal,  no gross focal neurological deficit Extremities: No pedal pitting edema, no tremors of outstretched hands Skin: Warm, color good. Psychiatric: Does not appear depressed or anxious  PERTINENT HISTORIC LABORATORY AND IMAGING STUDIES:  All pertinent laboratory results were reviewed. Please see HPI also for further details.   TSH   Date Value Ref Range Status  07/27/2024 0.95 mIU/L Final    Comment:              Reference Range .           > or = 20 Years  0.40-4.50 .                Pregnancy Ranges           First trimester    0.26-2.66           Second trimester   0.55-2.73           Third trimester    0.43-2.91   05/27/2024 3.65 mIU/L Final    Comment:              Reference Range .           > or = 20 Years  0.40-4.50 .                Pregnancy Ranges           First trimester    0.26-2.66           Second trimester   0.55-2.73           Third trimester    0.43-2.91   01/11/2023 1.52 0.35 - 5.50 uIU/mL Final    Lab Results  Component Value Date   FREET4 1.1 07/27/2024   FREET4 1.1 05/27/2024   FREET4 0.71 04/05/2023   T3FREE 3.0 07/27/2024   T3FREE 3.3 05/27/2024   T3FREE 3.0 04/05/2023   TSH 0.95 07/27/2024   TSH 3.65 05/27/2024   TSH 1.52 01/11/2023    No results found for: THYROTRECAB  Lab Results  Component Value Date   TSH 0.95 07/27/2024   TSH 3.65 05/27/2024   TSH 1.52 01/11/2023   FREET4 1.1 07/27/2024   FREET4 1.1 05/27/2024   FREET4 0.71 04/05/2023     Lab Results  Component Value Date   TSI <89 01/11/2023     No components found for: TRAB      Latest Reference Range & Units 09/17/19 16:06 10/22/19 15:39 08/24/21 16:05 12/05/21 15:07  TSH 0.450 - 4.500 uIU/mL 0.348 (  L) 0.757 0.326 (L) 2.420  Triiodothyronine,Free,Serum 2.0 - 4.4 pg/mL    2.0  T4,Free(Direct) 0.82 - 1.77 ng/dL    9.20 (L)  Thyroxine (T4) 4.5 - 12.0 ug/dL  5.3 4.5   Free Thyroxine Index 1.2 - 4.9   1.4 1.2   T3 Uptake Ratio 24 - 39 %  26 26   (L): Data is abnormally low   ASSESSMENT / PLAN  1. H/O hyperthyroidism   2. Abnormal thyroid  blood test    Patient was diagnosed with hyperthyroidism with mildly low TSH and normal T4, in the beginning of 2023, she was taking methimazole  since then, lately has been taking 5 mg daily.  She has not taken methimazole  since early November 2025.  She  had negative TRAb and TSI, negative for Graves' disease.  Etiology of hyperthyroidism is unclear.  When it was diagnosed it may have been also related to having thyroiditis, she had chronic sinusitis at that time.  Patient had normal thyroid  function test in November 2025.  Methimazole  was not resumed.  She has not taken methimazole  since November 2025.  Recent thyroid  function test normal without being on methimazole  for few months.  She is clinically euthyroid.  Plan: -No indication of antithyroid medication/methimazole .  She remained euthyroid biochemically and clinically without being on this medication. -Patient complains of continued vaginal bleeding, recommended referral and evaluation by GYN.  Patient wants to talk with primary care provider for the referral. -Patient has impression that methimazole  is helping with her vaginal bleeding.  Discussed that it is not related.  She at this time does not have active thyroid  disorder. - She verbalizes understanding and agrees with the plan made today. - Will continue to monitor thyroid  function test, follow-up in 6 months with labs prior to follow-up visit.  Diagnoses and all orders for this visit:  H/O hyperthyroidism -     T4, free -     T3, free -     TSH  Abnormal thyroid  blood test     DISPOSITION Follow up in clinic in 6 months suggested.  Labs prior to follow-up visit as ordered.  All questions answered and patient verbalized understanding of the plan.  Tammera Engert, MD [facility] [address] [place], [address] [place], [place] [address] Phone # [phone]   At least part of this note was generated using voice recognition software. Inadvertent word errors may have occurred, which were not recognized during the proofreading process. "

## 2024-08-02 LAB — METHYLMALONIC ACID, SERUM: Methylmalonic Acid, Quantitative: 106 nmol/L (ref 0–378)

## 2024-08-24 ENCOUNTER — Institutional Professional Consult (permissible substitution) (INDEPENDENT_AMBULATORY_CARE_PROVIDER_SITE_OTHER)

## 2024-08-28 ENCOUNTER — Inpatient Hospital Stay: Attending: Hematology and Oncology

## 2024-09-25 ENCOUNTER — Inpatient Hospital Stay: Attending: Hematology and Oncology

## 2024-10-23 ENCOUNTER — Inpatient Hospital Stay

## 2024-10-23 ENCOUNTER — Inpatient Hospital Stay: Admitting: Hematology and Oncology
